# Patient Record
Sex: Male | Born: 1945
Health system: Southern US, Community
[De-identification: ages and names within clinical notes are randomized; demographics above are authoritative.]

## PROBLEM LIST (undated history)

## (undated) DIAGNOSIS — Z87442 Personal history of urinary calculi: Secondary | ICD-10-CM

## (undated) DIAGNOSIS — Z8719 Personal history of other diseases of the digestive system: Secondary | ICD-10-CM

## (undated) DIAGNOSIS — N183 Chronic kidney disease, stage 3 unspecified: Secondary | ICD-10-CM

## (undated) DIAGNOSIS — I2510 Atherosclerotic heart disease of native coronary artery without angina pectoris: Secondary | ICD-10-CM

## (undated) DIAGNOSIS — I739 Peripheral vascular disease, unspecified: Secondary | ICD-10-CM

## (undated) DIAGNOSIS — E785 Hyperlipidemia, unspecified: Secondary | ICD-10-CM

## (undated) DIAGNOSIS — I10 Essential (primary) hypertension: Secondary | ICD-10-CM

## (undated) DIAGNOSIS — D649 Anemia, unspecified: Secondary | ICD-10-CM

## (undated) DIAGNOSIS — M51379 Other intervertebral disc degeneration, lumbosacral region without mention of lumbar back pain or lower extremity pain: Secondary | ICD-10-CM

## (undated) DIAGNOSIS — I219 Acute myocardial infarction, unspecified: Secondary | ICD-10-CM

## (undated) DIAGNOSIS — Z9289 Personal history of other medical treatment: Secondary | ICD-10-CM

## (undated) DIAGNOSIS — K222 Esophageal obstruction: Secondary | ICD-10-CM

## (undated) DIAGNOSIS — Z5189 Encounter for other specified aftercare: Secondary | ICD-10-CM

## (undated) DIAGNOSIS — K635 Polyp of colon: Secondary | ICD-10-CM

## (undated) DIAGNOSIS — K219 Gastro-esophageal reflux disease without esophagitis: Secondary | ICD-10-CM

## (undated) DIAGNOSIS — M5137 Other intervertebral disc degeneration, lumbosacral region: Secondary | ICD-10-CM

## (undated) DIAGNOSIS — IMO0001 Reserved for inherently not codable concepts without codable children: Secondary | ICD-10-CM

## (undated) HISTORY — DX: Essential (primary) hypertension: I10

## (undated) HISTORY — DX: Gastro-esophageal reflux disease without esophagitis: K21.9

## (undated) HISTORY — DX: Peripheral vascular disease, unspecified: I73.9

## (undated) HISTORY — DX: Anemia, unspecified: D64.9

## (undated) HISTORY — PX: CARDIAC CATHETERIZATION: SHX172

## (undated) HISTORY — DX: Hyperlipidemia, unspecified: E78.5

## (undated) HISTORY — PX: ANGIOPLASTY: SHX39

## (undated) HISTORY — PX: BACK SURGERY: SHX140

## (undated) HISTORY — DX: Reserved for inherently not codable concepts without codable children: IMO0001

## (undated) HISTORY — PX: OTHER SURGICAL HISTORY: SHX169

## (undated) HISTORY — DX: Acute myocardial infarction, unspecified: I21.9

## (undated) HISTORY — PX: COLONOSCOPY: SHX174

## (undated) HISTORY — DX: Encounter for other specified aftercare: Z51.89

## (undated) HISTORY — DX: Personal history of other medical treatment: Z92.89

## (undated) HISTORY — DX: Personal history of urinary calculi: Z87.442

## (undated) HISTORY — DX: Polyp of colon: K63.5

## (undated) HISTORY — DX: Other intervertebral disc degeneration, lumbosacral region: M51.37

## (undated) HISTORY — DX: Esophageal obstruction: K22.2

## (undated) HISTORY — PX: LITHOTRIPSY: SUR834

## (undated) HISTORY — DX: Other intervertebral disc degeneration, lumbosacral region without mention of lumbar back pain or lower extremity pain: M51.379

## (undated) SURGERY — Surgical Case
Anesthesia: *Unknown

---

## 1991-01-02 HISTORY — PX: CORONARY ARTERY BYPASS GRAFT: SHX141

## 1999-03-01 ENCOUNTER — Inpatient Hospital Stay (HOSPITAL_COMMUNITY): Admission: AD | Admit: 1999-03-01 | Discharge: 1999-03-03 | Payer: Self-pay | Admitting: Cardiovascular Disease

## 2004-05-17 ENCOUNTER — Ambulatory Visit: Payer: Self-pay | Admitting: Cardiology

## 2005-04-16 ENCOUNTER — Ambulatory Visit: Payer: Self-pay | Admitting: *Deleted

## 2005-04-16 ENCOUNTER — Inpatient Hospital Stay (HOSPITAL_COMMUNITY): Admission: EM | Admit: 2005-04-16 | Discharge: 2005-04-20 | Payer: Self-pay | Admitting: Internal Medicine

## 2005-04-17 ENCOUNTER — Encounter: Payer: Self-pay | Admitting: Cardiology

## 2005-05-07 ENCOUNTER — Ambulatory Visit: Payer: Self-pay | Admitting: Cardiology

## 2005-05-17 ENCOUNTER — Ambulatory Visit: Payer: Self-pay | Admitting: Cardiology

## 2005-05-29 ENCOUNTER — Ambulatory Visit: Payer: Self-pay | Admitting: Internal Medicine

## 2005-05-31 ENCOUNTER — Ambulatory Visit: Payer: Self-pay | Admitting: Cardiology

## 2005-05-31 ENCOUNTER — Ambulatory Visit: Payer: Self-pay

## 2005-06-07 ENCOUNTER — Ambulatory Visit: Payer: Self-pay | Admitting: Internal Medicine

## 2005-07-18 ENCOUNTER — Ambulatory Visit: Payer: Self-pay | Admitting: Cardiology

## 2005-07-19 ENCOUNTER — Inpatient Hospital Stay (HOSPITAL_BASED_OUTPATIENT_CLINIC_OR_DEPARTMENT_OTHER): Admission: RE | Admit: 2005-07-19 | Discharge: 2005-07-19 | Payer: Self-pay | Admitting: Cardiology

## 2005-07-19 ENCOUNTER — Inpatient Hospital Stay (HOSPITAL_COMMUNITY): Admission: RE | Admit: 2005-07-19 | Discharge: 2005-07-20 | Payer: Self-pay | Admitting: Cardiology

## 2005-07-19 ENCOUNTER — Ambulatory Visit: Payer: Self-pay | Admitting: Cardiology

## 2005-08-01 ENCOUNTER — Ambulatory Visit: Payer: Self-pay | Admitting: *Deleted

## 2005-08-01 ENCOUNTER — Ambulatory Visit: Payer: Self-pay | Admitting: Cardiology

## 2006-07-18 ENCOUNTER — Ambulatory Visit: Payer: Self-pay | Admitting: Cardiology

## 2006-07-18 LAB — CONVERTED CEMR LAB
BUN: 12 mg/dL (ref 6–23)
Basophils Absolute: 0 10*3/uL (ref 0.0–0.1)
Creatinine, Ser: 1.2 mg/dL (ref 0.4–1.5)
GFR calc non Af Amer: 66 mL/min
Hemoglobin: 13.9 g/dL (ref 13.0–17.0)
MCHC: 34.9 g/dL (ref 30.0–36.0)
Monocytes Absolute: 0.7 10*3/uL (ref 0.2–0.7)
Monocytes Relative: 9.8 % (ref 3.0–11.0)
Potassium: 4.1 meq/L (ref 3.5–5.1)
Pro B Natriuretic peptide (BNP): 35 pg/mL (ref 0.0–100.0)
RBC: 4.29 M/uL (ref 4.22–5.81)
RDW: 12.4 % (ref 11.5–14.6)
TSH: 0.96 microintl units/mL (ref 0.35–5.50)

## 2006-07-25 ENCOUNTER — Ambulatory Visit: Payer: Self-pay

## 2006-08-13 ENCOUNTER — Ambulatory Visit: Payer: Self-pay | Admitting: Cardiology

## 2006-08-16 ENCOUNTER — Ambulatory Visit: Payer: Self-pay | Admitting: Cardiology

## 2006-08-21 ENCOUNTER — Ambulatory Visit: Payer: Self-pay | Admitting: Internal Medicine

## 2006-08-26 ENCOUNTER — Ambulatory Visit: Payer: Self-pay | Admitting: Internal Medicine

## 2006-08-26 ENCOUNTER — Encounter: Payer: Self-pay | Admitting: Internal Medicine

## 2006-08-26 DIAGNOSIS — K222 Esophageal obstruction: Secondary | ICD-10-CM | POA: Insufficient documentation

## 2007-03-11 ENCOUNTER — Ambulatory Visit: Payer: Self-pay | Admitting: Cardiology

## 2007-04-09 DIAGNOSIS — E785 Hyperlipidemia, unspecified: Secondary | ICD-10-CM | POA: Insufficient documentation

## 2007-04-09 DIAGNOSIS — M5137 Other intervertebral disc degeneration, lumbosacral region: Secondary | ICD-10-CM | POA: Insufficient documentation

## 2007-04-09 DIAGNOSIS — Z87442 Personal history of urinary calculi: Secondary | ICD-10-CM | POA: Insufficient documentation

## 2007-04-09 DIAGNOSIS — I10 Essential (primary) hypertension: Secondary | ICD-10-CM | POA: Insufficient documentation

## 2007-08-10 ENCOUNTER — Inpatient Hospital Stay (HOSPITAL_COMMUNITY): Admission: EM | Admit: 2007-08-10 | Discharge: 2007-08-12 | Payer: Self-pay | Admitting: Emergency Medicine

## 2007-08-10 ENCOUNTER — Ambulatory Visit: Payer: Self-pay | Admitting: Cardiology

## 2007-08-27 ENCOUNTER — Ambulatory Visit: Payer: Self-pay | Admitting: Cardiology

## 2007-10-01 ENCOUNTER — Ambulatory Visit: Payer: Self-pay | Admitting: Cardiology

## 2007-10-01 LAB — CONVERTED CEMR LAB
Basophils Absolute: 0 10*3/uL (ref 0.0–0.1)
Chloride: 101 meq/L (ref 96–112)
Eosinophils Absolute: 0.2 10*3/uL (ref 0.0–0.7)
Eosinophils Relative: 3.7 % (ref 0.0–5.0)
GFR calc Af Amer: 53 mL/min
GFR calc non Af Amer: 44 mL/min
MCHC: 34.2 g/dL (ref 30.0–36.0)
MCV: 94.2 fL (ref 78.0–100.0)
Neutrophils Relative %: 56.8 % (ref 43.0–77.0)
Platelets: 224 10*3/uL (ref 150–400)
Potassium: 4.2 meq/L (ref 3.5–5.1)
RDW: 12.7 % (ref 11.5–14.6)
Sodium: 139 meq/L (ref 135–145)
WBC: 6.3 10*3/uL (ref 4.5–10.5)
aPTT: 28.5 s (ref 21.7–29.8)

## 2007-10-08 ENCOUNTER — Ambulatory Visit: Payer: Self-pay | Admitting: Cardiology

## 2007-10-08 ENCOUNTER — Inpatient Hospital Stay (HOSPITAL_COMMUNITY): Admission: AD | Admit: 2007-10-08 | Discharge: 2007-10-09 | Payer: Self-pay | Admitting: Cardiology

## 2007-10-09 ENCOUNTER — Encounter: Payer: Self-pay | Admitting: Cardiology

## 2007-10-22 ENCOUNTER — Ambulatory Visit: Payer: Self-pay | Admitting: Cardiology

## 2007-11-07 ENCOUNTER — Ambulatory Visit: Payer: Self-pay

## 2007-11-07 ENCOUNTER — Ambulatory Visit: Payer: Self-pay | Admitting: Cardiovascular Disease

## 2007-12-05 ENCOUNTER — Ambulatory Visit: Payer: Self-pay | Admitting: Cardiovascular Disease

## 2008-02-27 ENCOUNTER — Ambulatory Visit: Payer: Self-pay | Admitting: Cardiovascular Disease

## 2008-03-05 ENCOUNTER — Ambulatory Visit: Payer: Self-pay | Admitting: Cardiology

## 2008-03-05 LAB — CONVERTED CEMR LAB
BUN: 12 mg/dL (ref 6–23)
Chloride: 109 meq/L (ref 96–112)
Eosinophils Absolute: 0.2 10*3/uL (ref 0.0–0.7)
Eosinophils Relative: 3.2 % (ref 0.0–5.0)
GFR calc non Af Amer: 55 mL/min
Glucose, Bld: 112 mg/dL — ABNORMAL HIGH (ref 70–99)
INR: 1 (ref 0.8–1.0)
Monocytes Relative: 11.9 % (ref 3.0–12.0)
Neutrophils Relative %: 59.3 % (ref 43.0–77.0)
Platelets: 224 10*3/uL (ref 150–400)
Potassium: 4.5 meq/L (ref 3.5–5.1)
RDW: 12.9 % (ref 11.5–14.6)
Sodium: 143 meq/L (ref 135–145)
WBC: 5.7 10*3/uL (ref 4.5–10.5)

## 2008-03-09 ENCOUNTER — Inpatient Hospital Stay (HOSPITAL_COMMUNITY): Admission: AD | Admit: 2008-03-09 | Discharge: 2008-03-10 | Payer: Self-pay | Admitting: Cardiology

## 2008-03-09 ENCOUNTER — Ambulatory Visit: Payer: Self-pay | Admitting: Cardiology

## 2008-03-10 ENCOUNTER — Encounter: Payer: Self-pay | Admitting: Cardiology

## 2008-03-24 ENCOUNTER — Encounter (INDEPENDENT_AMBULATORY_CARE_PROVIDER_SITE_OTHER): Payer: Self-pay

## 2008-03-25 DIAGNOSIS — I25810 Atherosclerosis of coronary artery bypass graft(s) without angina pectoris: Secondary | ICD-10-CM | POA: Insufficient documentation

## 2008-03-26 ENCOUNTER — Encounter: Payer: Self-pay | Admitting: Cardiology

## 2008-03-26 ENCOUNTER — Ambulatory Visit: Payer: Self-pay | Admitting: Cardiology

## 2008-03-26 LAB — CONVERTED CEMR LAB
BUN: 15 mg/dL (ref 6–23)
Basophils Relative: 0.7 % (ref 0.0–3.0)
Chloride: 108 meq/L (ref 96–112)
Eosinophils Relative: 3.5 % (ref 0.0–5.0)
GFR calc non Af Amer: 59.54 mL/min (ref >60)
HCT: 34.1 % — ABNORMAL LOW (ref 39.0–52.0)
Hemoglobin: 11.4 g/dL — ABNORMAL LOW (ref 13.0–17.0)
Lymphs Abs: 1.7 10*3/uL (ref 0.7–4.0)
MCV: 84.8 fL (ref 78.0–100.0)
Monocytes Absolute: 0.7 10*3/uL (ref 0.1–1.0)
Monocytes Relative: 11.9 % (ref 3.0–12.0)
Neutro Abs: 3.5 10*3/uL (ref 1.4–7.7)
Platelets: 214 10*3/uL (ref 150.0–400.0)
Potassium: 4.4 meq/L (ref 3.5–5.1)
Sodium: 140 meq/L (ref 135–145)
WBC: 6.1 10*3/uL (ref 4.5–10.5)

## 2008-06-25 ENCOUNTER — Ambulatory Visit: Payer: Self-pay | Admitting: Cardiovascular Disease

## 2008-06-26 ENCOUNTER — Encounter: Payer: Self-pay | Admitting: Cardiology

## 2008-07-23 ENCOUNTER — Encounter: Payer: Self-pay | Admitting: Cardiovascular Disease

## 2008-07-23 LAB — CONVERTED CEMR LAB
Fecal Occult Blood: NEGATIVE
OCCULT 1: NEGATIVE
OCCULT 4: NEGATIVE
OCCULT 5: NEGATIVE

## 2008-07-27 ENCOUNTER — Encounter: Payer: Self-pay | Admitting: Cardiology

## 2008-07-29 ENCOUNTER — Telehealth: Payer: Self-pay | Admitting: Cardiology

## 2008-08-03 ENCOUNTER — Ambulatory Visit: Payer: Self-pay | Admitting: Cardiology

## 2008-08-03 DIAGNOSIS — K921 Melena: Secondary | ICD-10-CM | POA: Insufficient documentation

## 2008-08-03 LAB — CONVERTED CEMR LAB
Fecal Occult Blood: POSITIVE
OCCULT 1: POSITIVE
OCCULT 2: POSITIVE
OCCULT 3: POSITIVE

## 2008-08-05 ENCOUNTER — Ambulatory Visit: Payer: Self-pay | Admitting: Internal Medicine

## 2008-08-09 LAB — CONVERTED CEMR LAB
Folate: 7.8 ng/mL
Saturation Ratios: 4.6 % — ABNORMAL LOW (ref 20.0–50.0)
Vitamin B-12: 162 pg/mL — ABNORMAL LOW (ref 211–911)

## 2008-08-11 ENCOUNTER — Telehealth: Payer: Self-pay | Admitting: Internal Medicine

## 2008-08-11 ENCOUNTER — Ambulatory Visit: Payer: Self-pay | Admitting: Internal Medicine

## 2008-08-11 DIAGNOSIS — E538 Deficiency of other specified B group vitamins: Secondary | ICD-10-CM | POA: Insufficient documentation

## 2008-08-12 ENCOUNTER — Telehealth: Payer: Self-pay | Admitting: Internal Medicine

## 2008-08-16 ENCOUNTER — Telehealth: Payer: Self-pay | Admitting: Internal Medicine

## 2008-08-19 ENCOUNTER — Encounter: Payer: Self-pay | Admitting: Cardiology

## 2008-08-19 ENCOUNTER — Ambulatory Visit: Payer: Self-pay | Admitting: Internal Medicine

## 2008-08-19 ENCOUNTER — Encounter: Payer: Self-pay | Admitting: Internal Medicine

## 2008-08-23 ENCOUNTER — Encounter: Payer: Self-pay | Admitting: Internal Medicine

## 2008-10-14 ENCOUNTER — Ambulatory Visit: Payer: Self-pay | Admitting: Cardiology

## 2008-11-01 ENCOUNTER — Encounter: Payer: Self-pay | Admitting: Cardiology

## 2008-11-01 ENCOUNTER — Ambulatory Visit: Payer: Self-pay | Admitting: Cardiovascular Disease

## 2008-11-04 ENCOUNTER — Telehealth: Payer: Self-pay | Admitting: Cardiology

## 2008-11-11 ENCOUNTER — Encounter (INDEPENDENT_AMBULATORY_CARE_PROVIDER_SITE_OTHER): Payer: Self-pay | Admitting: *Deleted

## 2008-11-12 ENCOUNTER — Ambulatory Visit: Payer: Self-pay | Admitting: Cardiology

## 2008-11-12 ENCOUNTER — Encounter (INDEPENDENT_AMBULATORY_CARE_PROVIDER_SITE_OTHER): Payer: Self-pay | Admitting: *Deleted

## 2008-11-12 LAB — CONVERTED CEMR LAB
Iron: 14 ug/dL — ABNORMAL LOW (ref 42–165)
Transferrin: 280.7 mg/dL (ref 212.0–360.0)

## 2008-11-15 ENCOUNTER — Encounter: Payer: Self-pay | Admitting: Internal Medicine

## 2008-11-15 ENCOUNTER — Telehealth: Payer: Self-pay | Admitting: Internal Medicine

## 2008-11-15 DIAGNOSIS — D509 Iron deficiency anemia, unspecified: Secondary | ICD-10-CM | POA: Insufficient documentation

## 2008-11-19 ENCOUNTER — Encounter: Payer: Self-pay | Admitting: Internal Medicine

## 2008-11-19 ENCOUNTER — Ambulatory Visit (HOSPITAL_COMMUNITY): Admission: RE | Admit: 2008-11-19 | Discharge: 2008-11-19 | Payer: Self-pay | Admitting: Internal Medicine

## 2008-12-07 ENCOUNTER — Ambulatory Visit: Payer: Self-pay | Admitting: Internal Medicine

## 2008-12-07 ENCOUNTER — Telehealth: Payer: Self-pay | Admitting: Internal Medicine

## 2008-12-08 LAB — CONVERTED CEMR LAB
Basophils Absolute: 0 10*3/uL (ref 0.0–0.1)
Basophils Relative: 0.3 % (ref 0.0–3.0)
Eosinophils Absolute: 0.2 10*3/uL (ref 0.0–0.7)
Iron: 33 ug/dL — ABNORMAL LOW (ref 42–165)
Lymphocytes Relative: 21.8 % (ref 12.0–46.0)
MCHC: 32.5 g/dL (ref 30.0–36.0)
MCV: 84.4 fL (ref 78.0–100.0)
Monocytes Absolute: 0.7 10*3/uL (ref 0.1–1.0)
Neutro Abs: 3.9 10*3/uL (ref 1.4–7.7)
Neutrophils Relative %: 63.8 % (ref 43.0–77.0)
RBC: 4 M/uL — ABNORMAL LOW (ref 4.22–5.81)
RDW: 24.7 % — ABNORMAL HIGH (ref 11.5–14.6)

## 2008-12-10 ENCOUNTER — Ambulatory Visit (HOSPITAL_COMMUNITY): Admission: RE | Admit: 2008-12-10 | Discharge: 2008-12-10 | Payer: Self-pay | Admitting: Internal Medicine

## 2008-12-10 ENCOUNTER — Ambulatory Visit: Payer: Self-pay | Admitting: Internal Medicine

## 2009-05-03 ENCOUNTER — Encounter: Payer: Self-pay | Admitting: Cardiology

## 2009-05-03 ENCOUNTER — Ambulatory Visit: Payer: Self-pay | Admitting: Cardiovascular Disease

## 2009-05-11 ENCOUNTER — Telehealth (INDEPENDENT_AMBULATORY_CARE_PROVIDER_SITE_OTHER): Payer: Self-pay | Admitting: *Deleted

## 2009-08-16 ENCOUNTER — Encounter: Payer: Self-pay | Admitting: Cardiovascular Disease

## 2009-08-17 ENCOUNTER — Ambulatory Visit: Payer: Self-pay | Admitting: Cardiovascular Disease

## 2009-08-17 ENCOUNTER — Ambulatory Visit: Payer: Self-pay

## 2009-08-31 ENCOUNTER — Encounter: Payer: Self-pay | Admitting: Cardiology

## 2009-08-31 ENCOUNTER — Ambulatory Visit: Payer: Self-pay | Admitting: Cardiovascular Disease

## 2009-12-20 ENCOUNTER — Encounter: Payer: Self-pay | Admitting: Cardiology

## 2010-01-07 ENCOUNTER — Inpatient Hospital Stay (HOSPITAL_COMMUNITY)
Admission: EM | Admit: 2010-01-07 | Discharge: 2010-01-10 | Payer: Self-pay | Source: Home / Self Care | Attending: Cardiovascular Disease | Admitting: Cardiovascular Disease

## 2010-01-09 ENCOUNTER — Encounter (INDEPENDENT_AMBULATORY_CARE_PROVIDER_SITE_OTHER): Payer: Self-pay | Admitting: Internal Medicine

## 2010-01-16 LAB — LIPID PANEL
Cholesterol: 143 mg/dL (ref 0–200)
LDL Cholesterol: 64 mg/dL (ref 0–99)
Total CHOL/HDL Ratio: 6 RATIO
Triglycerides: 276 mg/dL — ABNORMAL HIGH (ref <150)
VLDL: 55 mg/dL — ABNORMAL HIGH (ref 0–40)

## 2010-01-16 LAB — CBC
HCT: 39.2 % (ref 39.0–52.0)
HCT: 36.3 % — ABNORMAL LOW (ref 39.0–52.0)
HCT: 36.6 % — ABNORMAL LOW (ref 39.0–52.0)
Hemoglobin: 12.7 g/dL — ABNORMAL LOW (ref 13.0–17.0)
Hemoglobin: 12.1 g/dL — ABNORMAL LOW (ref 13.0–17.0)
Hemoglobin: 12.2 g/dL — ABNORMAL LOW (ref 13.0–17.0)
MCH: 28.3 pg (ref 26.0–34.0)
MCH: 29.3 pg (ref 26.0–34.0)
MCH: 29.4 pg (ref 26.0–34.0)
MCHC: 32.4 g/dL (ref 30.0–36.0)
MCHC: 33.3 g/dL (ref 30.0–36.0)
MCHC: 33.3 g/dL (ref 30.0–36.0)
MCV: 87.3 fL (ref 78.0–100.0)
MCV: 88 fL (ref 78.0–100.0)
MCV: 88.3 fL (ref 78.0–100.0)
Platelets: 206 10*3/uL (ref 150–400)
Platelets: 187 10*3/uL (ref 150–400)
Platelets: 190 10*3/uL (ref 150–400)
RBC: 4.49 MIL/uL (ref 4.22–5.81)
RBC: 4.11 MIL/uL — ABNORMAL LOW (ref 4.22–5.81)
RBC: 4.16 MIL/uL — ABNORMAL LOW (ref 4.22–5.81)
RDW: 13.6 % (ref 11.5–15.5)
RDW: 13.7 % (ref 11.5–15.5)
RDW: 13.7 % (ref 11.5–15.5)
WBC: 5.8 10*3/uL (ref 4.0–10.5)
WBC: 5.8 10*3/uL (ref 4.0–10.5)
WBC: 8.6 10*3/uL (ref 4.0–10.5)

## 2010-01-16 LAB — CARDIAC PANEL(CRET KIN+CKTOT+MB+TROPI)
CK, MB: 12.6 ng/mL (ref 0.3–4.0)
CK, MB: 14.8 ng/mL (ref 0.3–4.0)
CK, MB: 10.6 ng/mL (ref 0.3–4.0)
Relative Index: 10.6 — ABNORMAL HIGH (ref 0.0–2.5)
Relative Index: 12 — ABNORMAL HIGH (ref 0.0–2.5)
Relative Index: 10.1 — ABNORMAL HIGH (ref 0.0–2.5)
Total CK: 119 U/L (ref 7–232)
Total CK: 123 U/L (ref 7–232)
Total CK: 105 U/L (ref 7–232)
Troponin I: 1.03 ng/mL (ref 0.00–0.06)
Troponin I: 1.68 ng/mL (ref 0.00–0.06)
Troponin I: 1.27 ng/mL (ref 0.00–0.06)

## 2010-01-16 LAB — HEPARIN LEVEL (UNFRACTIONATED)
Heparin Unfractionated: 0.65 IU/mL (ref 0.30–0.70)
Heparin Unfractionated: 0.48 IU/mL (ref 0.30–0.70)
Heparin Unfractionated: <0.1 IU/mL — ABNORMAL LOW (ref 0.30–0.70)

## 2010-01-16 LAB — POCT I-STAT, CHEM 8
BUN: 11 mg/dL (ref 6–23)
Calcium, Ion: 1.11 mmol/L — ABNORMAL LOW (ref 1.12–1.32)
Chloride: 106 mEq/L (ref 96–112)
Creatinine, Ser: 1.4 mg/dL (ref 0.4–1.5)
Glucose, Bld: 195 mg/dL — ABNORMAL HIGH (ref 70–99)
HCT: 39 % (ref 39.0–52.0)
Hemoglobin: 13.3 g/dL (ref 13.0–17.0)
Potassium: 3.6 mEq/L (ref 3.5–5.1)
Sodium: 141 mEq/L (ref 135–145)
TCO2: 24 mmol/L (ref 0–100)

## 2010-01-16 LAB — MAGNESIUM: Magnesium: 2.2 mg/dL (ref 1.5–2.5)

## 2010-01-16 LAB — BASIC METABOLIC PANEL
BUN: 13 mg/dL (ref 6–23)
BUN: 12 mg/dL (ref 6–23)
CO2: 26 mEq/L (ref 19–32)
CO2: 25 mEq/L (ref 19–32)
Calcium: 8.8 mg/dL (ref 8.4–10.5)
Calcium: 8.7 mg/dL (ref 8.4–10.5)
Chloride: 109 mEq/L (ref 96–112)
Chloride: 110 mEq/L (ref 96–112)
Creatinine, Ser: 1.45 mg/dL (ref 0.4–1.5)
Creatinine, Ser: 1.48 mg/dL (ref 0.4–1.5)
GFR calc Af Amer: 59 mL/min — ABNORMAL LOW (ref >60)
GFR calc Af Amer: 58 mL/min — ABNORMAL LOW (ref >60)
GFR calc non Af Amer: 49 mL/min — ABNORMAL LOW (ref >60)
GFR calc non Af Amer: 48 mL/min — ABNORMAL LOW (ref >60)
Glucose, Bld: 145 mg/dL — ABNORMAL HIGH (ref 70–99)
Glucose, Bld: 134 mg/dL — ABNORMAL HIGH (ref 70–99)
Potassium: 4.2 mEq/L (ref 3.5–5.1)
Potassium: 4 mEq/L (ref 3.5–5.1)
Sodium: 140 mEq/L (ref 135–145)
Sodium: 140 mEq/L (ref 135–145)

## 2010-01-16 LAB — TROPONIN I: Troponin I: 0.09 ng/mL — ABNORMAL HIGH (ref 0.00–0.06)

## 2010-01-16 LAB — PROTIME-INR
INR: 1.04 (ref 0.00–1.49)
Prothrombin Time: 13.8 seconds (ref 11.6–15.2)

## 2010-01-16 LAB — DIFFERENTIAL
Basophils Absolute: 0 10*3/uL (ref 0.0–0.1)
Basophils Relative: 0 % (ref 0–1)
Eosinophils Absolute: 0.1 10*3/uL (ref 0.0–0.7)
Eosinophils Relative: 2 % (ref 0–5)
Lymphocytes Relative: 18 % (ref 12–46)
Lymphs Abs: 1.1 10*3/uL (ref 0.7–4.0)
Monocytes Absolute: 0.7 10*3/uL (ref 0.1–1.0)
Monocytes Relative: 12 % (ref 3–12)
Neutro Abs: 3.9 10*3/uL (ref 1.7–7.7)
Neutrophils Relative %: 68 % (ref 43–77)

## 2010-01-16 LAB — CK TOTAL AND CKMB (NOT AT ARMC)
CK, MB: 3.2 ng/mL (ref 0.3–4.0)
Relative Index: INVALID (ref 0.0–2.5)
Total CK: 85 U/L (ref 7–232)

## 2010-01-16 LAB — POCT CARDIAC MARKERS
CKMB, poc: 1.1 ng/mL (ref 1.0–8.0)
CKMB, poc: 2.2 ng/mL (ref 1.0–8.0)
Myoglobin, poc: 84.5 ng/mL (ref 12–200)
Myoglobin, poc: 185 ng/mL (ref 12–200)
Troponin i, poc: <0.05 ng/mL (ref 0.00–0.09)
Troponin i, poc: <0.05 ng/mL (ref 0.00–0.09)

## 2010-01-16 LAB — PLATELET INHIBITION P2Y12
Platelet Function  P2Y12: 124 [PRU] — ABNORMAL LOW (ref 194–418)
Platelet Function Baseline: 293 [PRU] (ref 194–418)

## 2010-01-16 LAB — MRSA PCR SCREENING: MRSA by PCR: NEGATIVE

## 2010-01-16 LAB — HEMOGLOBIN A1C: Hgb A1c MFr Bld: 6.4 % — ABNORMAL HIGH (ref <5.7)

## 2010-01-20 ENCOUNTER — Encounter: Payer: Self-pay | Admitting: Physician Assistant

## 2010-01-22 NOTE — Procedures (Addendum)
  NAMESIVAN, QUAST NO.:  [id number]  MEDICAL RECORD NO.:  [id number]          PATIENT TYPE:  INP  LOCATION:  2915                         FACILITY:  [facility]  PHYSICIAN:  Bevelyn Buckles. Bensimhon, MDDATE OF BIRTH:  1945/01/27  DATE OF PROCEDURE:  01/09/2010 DATE OF DISCHARGE:                           CARDIAC CATHETERIZATION   PATIENT IDENTIFICATION:  [name] is a very pleasant 65 year old male with history of severe coronary artery disease status post multiple percutaneous interventions.  Most recently he underwent overlapping long drug-eluting stents to the right coronary artery in March 2010.  In 2009,  Dr. Excell Seltzer placed dual Promus drug-eluting stents in the saphenous vein graft to his marginal system.  He was admitted with a non- ST elevation myocardial infarction and brought for diagnostic angiography.  PROCEDURES PERFORMED: 1. Selective coronary angiography. 2. Left internal mammary artery angiography. 3. Saphenous vein graft angiography x2. 4. Left heart catheterization. 5. Left ventriculogram.  DESCRIPTION OF PROCEDURE:  The risks and indication were explained. Consent was signed and placed on the chart.  A 5-French arterial sheath was placed in the right femoral artery using modified Seldinger technique.  Standard catheters including JL-4, JR-4, and angled pigtail were used.  All catheter exchanges made over wire.  There were no apparent complications.  Central aortic pressure 107/57 with a mean of 77.  LV pressure of 122/5 with an EDP of 14.  There was no aortic stenosis.  Left main was free of significant disease.  LAD gave off a tiny OM1 and an OM2 and then   DICTATION ENDED AT THIS POINT.     Bevelyn Buckles. Bensimhon, MD     DRB/MEDQ  D:  01/09/2010  T:  01/10/2010  Job:  [id number]  Electronically Signed by Arvilla Meres MD on 01/22/2010 07:12:53 PM

## 2010-01-22 NOTE — Procedures (Addendum)
NAMEJAVID, [name] NO.:  [id number]  MEDICAL RECORD NO.:  [id number]          PATIENT TYPE:  INP  LOCATION:  2915                         FACILITY:  [facility]  PHYSICIAN:  [name], MDDATE OF BIRTH:  July 24, 1945  DATE OF PROCEDURE:  01/09/2010 DATE OF DISCHARGE:                           CARDIAC CATHETERIZATION   PRIMARY CARE PHYSICIAN:  Dr. [name] in [place], [place].  PATIENT IDENTIFICATION:  [name] is a very pleasant 65 year old male with history of known severe coronary artery disease.  He is status post bypass surgery and multiple coronary interventions.  He underwent dual drug-eluting stents to the right coronary artery back in March 2010 with [name] and in 2009 [name] placed Promus drug-eluting stents in his saphenous vein graft to the marginal system.  He is admitted with non-ST elevation myocardial infarction and brought to the catheterization lab for diagnostic angiography.  PROCEDURES PERFORMED: 1. Selective coronary angiography. 2. Left internal mammary artery angiography. 3. Saphenous vein graft angiography x2. 4. Left heart catheterization. 5. Left ventriculogram.  DESCRIPTION OF PROCEDURE:  The risks and indication of catheterization were explained.  Consent was signed and placed on the chart.  A 5-French arterial sheath was placed in the right femoral artery using modified Seldinger technique.  Standard catheters including JL-4, JR-4, and angled pigtail were used.  All catheter exchanges made over a wire. There were no apparent complications.  Central aortic pressure 107/57 with mean of 77.  LV pressure of 122/5 with an EDP of 14.  There was no aortic stenosis.  Left main was free of significant disease.  LAD gave off a tiny first diagonal and small to moderate second diagonal.  It was then totally occluded.  Left circumflex gave off a tiny OM branch and that was totally occluded in the proximal  portion.  Right coronary artery was a large dominant vessel.  It gave off PDA in the posterolateral.  There was a long area of stenting from the proximal RCA all the way around the bend to the distal RCA.  Prior to the stenting there was a 30% area of stenosis.  The stents were widely patent.  There was a distal 30% stenosis just prior to the PDA.  Saphenous vein graft to the RCA was totally occluded.  This was chronic.  Saphenous vein sequential graft to the OM1 and OM2 had two previously placed drug-eluting stents.  In the ostial stent there was a long 90-99% stenosis.  The remainder of the graft was free of significant disease.  LIMA to the LAD was widely patent.  There appeared to be a sequential graft to the second diagonal, which was also patent.  The LAD itself had just mild plaquing.  There was collateral from the apical LAD to what appeared to be an atrial branch.  Left ventriculogram done in the RAO position by hand injection showed an ejection fraction of approximately 60%.  Otherwise, no apparent wall motion abnormalities, though the opacification of the ventricle was limited.  ASSESSMENT: 1. Severe three-vessel coronary artery disease. 2. Saphenous vein graft to the right coronary artery system that  is     totally occluded, which is chronic. 3. Left internal mammary artery to the left anterior descending and     second diagonal are widely patent. 4. Saphenous vein graft to the obtuse marginal 2 and obtuse marginal 3     have a long 90-99% in-stent restenosis in the proximal stent. 5. Normal left ventricular function.  PLAN/DISCUSSION:  He will need percutaneous intervention on the saphenous vein graft to the obtuse marginal system by [name].     Douglas Buckles. Bensimhon, MD     DRB/MEDQ  D:  01/09/2010  T:  01/10/2010  Job:  [id number]  Electronically Signed by Douglas Meres MD on 01/22/2010 07:12:55 PM

## 2010-01-23 ENCOUNTER — Encounter: Payer: Self-pay | Admitting: Physician Assistant

## 2010-01-23 ENCOUNTER — Ambulatory Visit
Admission: RE | Admit: 2010-01-23 | Discharge: 2010-01-23 | Payer: Self-pay | Source: Home / Self Care | Attending: Physician Assistant | Admitting: Physician Assistant

## 2010-01-23 ENCOUNTER — Other Ambulatory Visit: Payer: Self-pay | Admitting: Physician Assistant

## 2010-01-23 LAB — BASIC METABOLIC PANEL
CO2: 23 mEq/L (ref 19–32)
Calcium: 9 mg/dL (ref 8.4–10.5)
Creatinine, Ser: 1.6 mg/dL — ABNORMAL HIGH (ref 0.4–1.5)
Glucose, Bld: 103 mg/dL — ABNORMAL HIGH (ref 70–99)

## 2010-01-24 ENCOUNTER — Encounter: Payer: Self-pay | Admitting: Physician Assistant

## 2010-01-31 NOTE — Discharge Summary (Signed)
[name], [name]                ACCOUNT NO.:  [id number]  MEDICAL RECORD NO.:  [id number]           PATIENT TYPE:  LOCATION:                                 FACILITY:  PHYSICIAN:  [name], [name]  DATE OF BIRTH:  Oct 03, 1945  DATE OF ADMISSION:  01/08/2010 DATE OF DISCHARGE:  01/10/2010                              DISCHARGE SUMMARY   PRIMARY CARDIOLOGIST:  [name], [name]  PRIMARY CARE PROVIDER:  Dr. [name]  DISCHARGE DIAGNOSIS:  Non-ST-segment elevation myocardial infarction.  SECONDARY DIAGNOSES: 1. Coronary artery disease status post prior coronary bypass grafting     as well as prior stenting with repeat stenting due to in-stent     restenosis in the vein graft to the OM2 and OM3 this admission. 2. Hypertension. 3. Hyperlipidemia. 4. Degenerative disk disease involving the neck and back. 5. History of nephrolithiasis status post lithotripsy. 6. Remote tobacco abuse quitting in 1989.  ALLERGIES:  No known drug allergies.  PROCEDURES:  A 2-D echocardiogram January 09, 2010 revealing an EF of 60- 65% with mild LVH and no regional wall motion abnormalities.  There is grade 1 diastolic dysfunction.  PROCEDURE:  Left heart cardiac catheterization January 09, 2010 revealing an EF of 60%.  The patient had significant multivessel coronary artery disease involving the LAD and branch vessels as well as the left circumflex and branch vessels.  There were multiple patent stents throughout the right coronary artery.  Vein graft to the right coronary artery was occluded.  LIMA to the LAD and diagonal was patent. Sequential vein graft to the OM2 and OM3 had patent stents in the distal portion of the graft body with a 99% in-stent restenosis within the proximal portion of the graft.  PROCEDURES:  Successful PCI and stenting of the vein graft to the OM2 and OM3 with placement of 3.5 x 32-mm ION drug-eluting stent along with a 3.0 x 12-mm  ION drug-eluting stent.  HISTORY OF PRESENT ILLNESS:  A 66 year old male with prior history of coronary disease status post prior coronary bypass grafting as well as prior stenting of the right coronary artery and sequential vein graft to the OM2 and OM3.  The patient was in his usual state of health until the evening prior to admission when he had sudden onset of substernal chest tightness with radiation to his arm and associated restlessness.  He presented to the [facility] ED where his initial set initial point-of- care markers were negative and ECG showed no acute changes.  He was admitted for further evaluation.  HOSPITAL COURSE:  The patient subsequently ruled in for non-ST-segment elevation myocardial infarction eventually peaking his CK at 123, MB at 14.8, and troponin I at 1.68.  The patient was maintained on aspirin, Plavix, beta-blocker, and statin therapy and was also anticoagulated using heparin.  Decision was made to pursue cardiac catheterization which took place January 9 revealing significant multivessel native coronary artery disease with patency of the LIMA to the LAD and diagonal and new in-stent restenosis within the sequential vein graft to the OM2 and OM3.  This  was successfully stented with 2 ION drug-eluting stents as outlined above.  The patient tolerated the procedure well postprocedure and had been ambulating without recurrent symptoms or limitations.  He will be discharged home today in good condition.  DISCHARGE LABS:  Hemoglobin 12.1, hematocrit 36.3, WBC 5.8, platelets 187,000.  P2Y12 124 with 58% inhibition.  Sodium 140, potassium 4.0, chloride 110, CO2 25, BUN 12, creatinine 1.48, glucose 134, calcium 8.7, hemoglobin A1c 6.4, CK 105, MB 10.6, troponin-I 1.27, total cholesterol 143, triglycerides 276, HDL 24, LDL 64.  MRSA screen was negative.  DISPOSITION:  The patient will be discharged home today in good condition.  FOLLOWUP PLANS AND  APPOINTMENTS:  We have arranged followup with Douglas Newcomer, PA in [facility] Office on January 23 at 12:00 p.m.  He will follow up with Dr. [name] from there forward, follow up with [name] as previously scheduled.  DISCHARGE MEDICATIONS:  Amlodipine 10 mg daily, nitroglycerin 0.4 mg sublingual p.r.n. chest pain, aspirin 325 mg daily, lisinopril 20 mg b.i.d., metoprolol tartrate 50 mg b.i.d., Plavix 75 mg daily, simvastatin 40 mg at bedtime.  OUTSTANDING LABS AND STUDIES:  None.  DURATION OF DISCHARGE ENCOUNTER:  45 minutes including physician time.     [name], Rice   ______________________________ [name], [name]    CB/MEDQ  D:  01/10/2010  T:  01/11/2010  Job:  [id number]  cc:   [name]  Electronically Signed by [name] on 01/23/2010 03:56:30 PM Electronically Signed by [name] on 01/31/2010 04:47:36 AM

## 2010-02-02 NOTE — Miscellaneous (Signed)
  Clinical Lists Changes  Observations: Added new observation of CARDCATHFIND: FINAL INTERPRETATION:  Successful stenting of the proximal saphenous vein graft to obtuse marginal vessel with drug-eluting stent for restenosis.   (01/09/2010 9:33) Added new observation of CARDCATHFIND: DESCRIPTION OF PROCEDURE:  The risks and indication were explained. Consent was signed and placed on the chart.  A 5-French arterial sheath was placed in the right femoral artery using modified Seldinger technique.  Standard catheters including JL-4, JR-4, and angled pigtail were used.  All catheter exchanges made over wire.  There were no apparent complications.   Central aortic pressure 107/57 with a mean of 77.  LV pressure of 122/5 with an EDP of 14.  There was no aortic stenosis.   Left main was free of significant disease.   LAD gave off a tiny OM1 and an OM2 and then     DICTATION ENDED AT THIS POINT.     (01/09/2010 9:32)      Cardiac Cath  Procedure date:  01/09/2010  Findings:      DESCRIPTION OF PROCEDURE:  The risks and indication were explained. Consent was signed and placed on the chart.  A 5-French arterial sheath was placed in the right femoral artery using modified Seldinger technique.  Standard catheters including JL-4, JR-4, and angled pigtail were used.  All catheter exchanges made over wire.  There were no apparent complications.   Central aortic pressure 107/57 with a mean of 77.  LV pressure of 122/5 with an EDP of 14.  There was no aortic stenosis.   Left main was free of significant disease.   LAD gave off a tiny OM1 and an OM2 and then     DICTATION ENDED AT THIS POINT.      Cardiac Cath  Procedure date:  01/09/2010  Findings:      FINAL INTERPRETATION:  Successful stenting of the proximal saphenous vein graft to obtuse marginal vessel with drug-eluting stent for restenosis.

## 2010-02-02 NOTE — Assessment & Plan Note (Signed)
Summary: eph  Medications Added LISINOPRIL 10 MG TABS (LISINOPRIL) 1 tab by mouth two times a day CRESTOR 20 MG TABS (ROSUVASTATIN CALCIUM) Take one tablet by mouth daily. ASPIRIN EC 325 MG TBEC (ASPIRIN) Take one tablet by mouth daily AMLODIPINE BESYLATE 10 MG TABS (AMLODIPINE BESYLATE) Take one tablet by mouth daily        Referring Provider:  n/a Primary Provider:  Jefm Petty MD   History of Present Illness: Primary Cardiologist:  Dr. Tonny Bollman  [name] is a 65 yo male previously followed by Dr. Juanda Chance, with a h/o CAD, s/p CABG in 1993 and multiple PCIs in the past including a DES to the RCA in 2007 2/2 NSTEMI, DES to the S-OM in 2009 and DES to the mid and dist RCA in 2010.  He presented to [facility] with a NSTEMI on 01/08/10 and was noted to have a high grade lesion to in the S-OM1/OM2.  This was treated with 2 ION drug eluting stents.  He presents for follow up.  Labs:  Na 140, K 4, BUN 12, Creat 1.48 (01/10/10) Hgb 12.1 (01/10/10) TC 143, TG 276, HDL 24, LDL 64 (01/10/10) A1C 6.4 (1/61/09)  He is doing well.  He denies chest pain, shortness of breath, syncope.  He denies orthopnea or PND.  His right femoral artery site seems to have healed well without pain or swelling.  Current Medications (verified): 1)  Plavix 75 Mg Tabs (Clopidogrel Bisulfate) .... Take Ine Tab Once Daily 2)  Lisinopril 10 Mg Tabs (Lisinopril) .Marland Kitchen.. 1 Tab By Mouth Two Times A Day 3)  Metoprolol Tartrate 50 Mg Tabs (Metoprolol Tartrate) .... Take One Tablet By Mouth Twice A Day 4)  Nitrostat 0.4 Mg Subl (Nitroglycerin) .... Prn 5)  Simvastatin 40 Mg Tabs (Simvastatin) .Marland Kitchen.. 1 Tablet Before Bedtime 6)  Aspirin Ec 325 Mg Tbec (Aspirin) .... Take One Tablet By Mouth Daily 7)  Prilosec 20 Mg Cpdr (Omeprazole) .... Take 1 Tablet By Mouth Two Times A Day 8)  Amlodipine Besylate 10 Mg Tabs (Amlodipine Besylate) .... Take One Tablet By Mouth Daily  Allergies: No Known Drug Allergies  Past  History:  Past Medical History: ANGINA, UNSTABLE (ICD-411.1) NEPHROLITHIASIS, HX OF (ICD-V13.01) ESOPHAGEAL STRICTURE (ICD-530.3) CAD (ICD-414.00) DEGENERATIVE DISC DISEASE, LUMBAR SPINE (ICD-722.52) HYPERLIPIDEMIA (ICD-272.4) MYOCARDIAL INFARCTION, HX OF (ICD-412) HYPERTENSION (ICD-401.9) Anemia  Review of Systems       As per  the HPI.  All other systems reviewed and negative.   Vital Signs:  Patient profile:   65 year old male Height:      66 inches Weight:      180 pounds BMI:     29.16 Pulse rate:   60 / minute Resp:     14 per minute BP sitting:   96 / 52  (left arm)  Vitals Entered By: Kem Parkinson (January 23, 2010 12:05 PM)  Physical Exam  General:  Well nourished, well developed, in no acute distress HEENT: normal Neck: no JVD Cardiac:  normal S1, S2; RRR; no murmur Lungs:  clear to auscultation bilaterally, no wheezing, rhonchi or rales Abd: soft, nontender, no hepatomegaly Ext: no edema; RFA site without hematoma or bruit Skin: warm and dry Neuro:  CNs 2-12 intact, no focal abnormalities noted    EKG  Procedure date:  01/23/2010  Findings:      sinus bradycardia Heart rate 56 Leftward axis Inferior Q waves Nonspecific ST-T wave changes  Impression & Recommendations:  Problem # 1:  CAD (  ICD-414.00)  He is doing well without angina.  He will continue on aspirin and Plavix.  He is walking on his own without difficulty.  Followup with Dr. Excell Seltzer in 6-8 weeks.  Orders: EKG w/ Interpretation (93000) TLB-BMP (Basic Metabolic Panel-BMET) (80048-METABOL)  Problem # 2:  HYPERLIPIDEMIA (ICD-272.4) He really should not take simvastatin 40 mg with his amlodipine.  His HDL is low and his HDL is high.  I have recommended changing his simvastatin to Crestor 20 mg a day.  We will try to provide him with some samples.  He will followup with lipids and LFTs in 6-8 weeks.  Problem # 3:  HYPERTENSION (ICD-401.9) His blood pressure is running in the 90s  systolically.  He denies lightheadedness or near syncope or feelings of fatigue.  Continue current medications.  Creat was borderline high in the hospital.  Repeat bmet today.  Problem # 4:  Glucose Intolerance F/u with PCP.  Patient Instructions: 1)  Labwork today: bmet (414.01;401.1). 2)  Stop Simvastatin. 3)  Start Crestor 20mg  once daily- you have been given samples of this to start with . You have also been given a prescription so that you may price check this at your pharmacy.  If you find this is too expensive, please let us know. 4)  You will need to return for FASTING labwork in 6-8 weeks: lipid/liver (414.01;272.4). 5)  Your physician recommends that you schedule a follow-up appointment in: 8 weeks with Dr. Excell Seltzer.  Prescriptions: CRESTOR 20 MG TABS (ROSUVASTATIN CALCIUM) Take one tablet by mouth daily.  #30 x 6   Entered by:   Sherri Rad, RN, BSN   Authorized by:   Tereso Newcomer PA-C   Signed by:   Sherri Rad, RN, BSN on 01/23/2010   Method used:   Print then Give to Patient   RxID:   [id number]  I have personally reviewed the prescriptions today for accuracy. Tereso Newcomer PA-C  January 23, 2010 12:46 PM   Appended Document: eph The pt is taking lisinopril 20mg  two times a day instead of 10mg  two times a day.

## 2010-02-02 NOTE — Miscellaneous (Signed)
Summary: Orders Update  Clinical Lists Changes  Orders: Added new Test order of Arterial Duplex Lower Extremity (Arterial Duplex Low) - Signed 

## 2010-02-02 NOTE — Progress Notes (Signed)
Summary: refill**please note enclosed**   Phone Note Refill Request   Refills Requested: Medication #1:  LISINOPRIL 10 MG TABS take one tab two  times a day   Supply Requested: 1 year [facility],  states pharmacy does not have any refills   Method Requested: Fax to Local Pharmacy Initial call taken by: Migdalia Dk,  May 11, 2009 2:42 PM  Follow-up for Phone Call        Rx faxed to pharmacy Follow-up by: Oswald Hillock,  May 11, 2009 4:55 PM    Prescriptions: LISINOPRIL 10 MG TABS (LISINOPRIL) take one tab two  times a day  #60 x 5   Entered by:   Oswald Hillock   Authorized by:   Lenoria Farrier, MD, [facility]   Signed by:   Oswald Hillock on 05/11/2009   Method used:   Faxed to ...       [facility] [phone]* (retail)       [address]       [place], [place]  [address]       Ph: [phone]       Fax: [phone]   RxID:   [id number]

## 2010-02-15 NOTE — H&P (Signed)
[name], [name]                ACCOUNT NO.:  [id number]  MEDICAL RECORD NO.:  [id number]          PATIENT TYPE:  EMS  LOCATION:  MAJO                         FACILITY:  [facility]  PHYSICIAN:  Karn Cassis, MD  DATE OF BIRTH:  July 24, 1945  DATE OF ADMISSION:  01/07/2010 DATE OF DISCHARGE:                             HISTORY & PHYSICAL   PRIMARY CARDIOLOGIST:  Everardo Beals. Juanda Chance, MD, [facility] and Dr. Excell Seltzer.  PRIMARY CARE PHYSICIAN:  Dr. Donata Duff in [place], [place].  CHIEF COMPLAINT:  Chest pain.  HISTORY OF PRESENT ILLNESS:  [name] is a 65 year old male with history of coronary artery disease, status post four-vessel CABG in 1993, status post multiple PCIs.  His most recent cardiac catheterization is from March of 2010 which showed that LIMA to LAD was patent, his vein graft to the RCA was occluded, and the vein graft to the posterior lateral branch off left circumflex as well as a vein graft to the obtuse marginal were patent.  During his March of 2010 hospitalization, he underwent a PCI to the distal RCA with a Xience stent and mid RCA with a Cypher stent.  Since his discharge in 2010, he has done relatively well.  He was in his usual state of health up until yesterday evening when he developed substernal chest tightness radiating to his arm.  He reports that he "just could not get comfortable."  He came to the emergency department because his symptoms were similar with those to his previous anginal equivalent.  His two sets of cardiac markers that were performed in the ED were negative.  He was started on heparin and nitroglycerin.  At this point, I was asked for an admission.  On my interview, he reports that his pain is down from 8/10 at its peak to 2/10.  He is currently on nitroglycerin drip which is running at 1.5 mcg.  In addition to the chest pain, he has noticed symptoms of leg cramps, does occasionally occur with exercise but can also occur at rest.  As  far as the patient recalls, he has not been evaluated for peripheral vascular disease.  PAST MEDICAL HISTORY: 1. Coronary artery disease, status post four-vessel bypass graft in     1993.  His most recent cardiac cath was in March of 2010.  The     results of which are outlined in HPI above. 2. Hypertension. 3. Hyperlipidemia. 4. Degenerative disk disease involving the neck and back. 5. History of multiple kidney stones, status post bilateral     lithotripsy. 6. Last echo is from 2007 which shows normal left ventricular     contractile function.  SOCIAL HISTORY:  [place].  They also have a home at [facility].  He is retired from [employer].  He has a remote history of smoking but quit tobacco in 1989. He denies any history of alcohol abuse or illicit drug use.  FAMILY HISTORY:  Mother died of coronary artery disease.  Mother underwent CABG.  Father died at young age.  The etiology is not clear. His siblings all  have known coronary artery disease and several underwent bypass surgeries and PCIs.  REVIEW OF SYSTEMS:  A 12 review of systems is negative except for symptoms outlined in HPI above.  ALLERGIES:  No known drug allergies.  CURRENT MEDICATIONS:  Include: 1. Lisinopril 10 mg p.o. b.i.d. 2. Simvastatin 40 mg p.o. every day. 3. Metoprolol 50 mg p.o. b.i.d. 4. Plavix 75 mg once a day. 5. Aspirin 325 mg once a day. 6. Niacin 500 mg (this is listed as one of his home medications, but     the patient does not take this secondary to flushing).  PHYSICAL EXAMINATION:  Vital Signs:  His blood pressure on arrival was 193/81, it is currently down to 177/83; his temperature on arrival was 98.3; pulse of 70; he is saturating 98% on room air. GENERAL:  Well-appearing male, in no apparent distress. CARDIOVASCULAR:  Regular rate and rhythm.  Normal S1, S2.  No murmurs, gallops or rubs. LUNGS:  Clear to auscultate except for faint  crackles at the left base. ABDOMEN:  Obese, soft, nontender, nondistended. EXTREMITIES:  No signs of cyanosis or clubbing. PSYCH:  Alert and oriented. SKIN:  No rashes or lesions.  LABORATORY DATA:  His first two sets of cardiac markers are within normal limits including troponin and CK-MB.  CBC shows white count of 5.8, hematocrit 39 and a platelet count of 206.  His chemistries are unremarkable except for glucose of 195.  A 12-lead EKG shows normal sinus rhythm with heart rate of 76 and poor R-wave progression in anterior precordial leads.  ASSESSMENT AND PLAN:  In summary, this is a 65 year old male with history of coronary artery disease, status post four-vessel CABG.  His vein graft is known to be occluded to the RCA.  He underwent two stents to the RCA during his last hospitalization in 2010.  He is being admitted for unstable angina. 1. Unstable angina:  His blood pressure on arrival was 193/81.  He     continues to be hypertensive with blood pressure in 170s systolic.     I will increase the dose of his nitroglycerin drip with a goal to     titrate for chest pain relief.  In addition, I will start him on     more aggressive antihypertensive therapy including doubling his     lisinopril from 10 mg to 20 mg b.i.d., we will also add amlodipine     10 mg once a day and continue his home dose of metoprolol 50 mg     p.o. b.i.d.  I will switch him from his simvastatin 40 mg to     Lipitor 80 mg once a day.  Since he has not been able to tolerate     niacin due to side effect, Zetia could be considered in the future.     He will be started on aspirin, Plavix and heparin until three sets     of cardiac markers return.  I will lastly order transthoracic echo     since it has been several years that his LV function had been     quantified. 2. Hypertension as mentioned above. 3. Hyperlipidemia:  We will send for fasting lipid panel for better     risk stratification. 4. Elevated  glucose:  The patient does not carry a diagnosis of     diabetes, but his sugars are 190s.  We will send for a hemoglobin     A1c and check a fasting blood glucose in the  morning.     Karn Cassis, MD     PV/MEDQ  D:  01/08/2010  T:  01/08/2010  Job:  [id number]  Electronically Signed by Karn Cassis MD on 02/15/2010 01:22:09 PM

## 2010-02-22 NOTE — Medication Information (Signed)
Summary: Physician's Orders   Physician's Orders   Imported By: Roderic Ovens 02/16/2010 15:47:35  _____________________________________________________________________  External Attachment:    Type:   Image     Comment:   External Document

## 2010-03-07 ENCOUNTER — Encounter: Payer: Self-pay | Admitting: Cardiovascular Disease

## 2010-03-07 ENCOUNTER — Encounter: Payer: Self-pay | Admitting: Physician Assistant

## 2010-03-20 ENCOUNTER — Encounter: Payer: Self-pay | Admitting: Cardiovascular Disease

## 2010-03-21 ENCOUNTER — Encounter: Payer: Self-pay | Admitting: Cardiovascular Disease

## 2010-03-21 ENCOUNTER — Encounter: Payer: Self-pay | Admitting: Cardiology

## 2010-03-21 ENCOUNTER — Ambulatory Visit (INDEPENDENT_AMBULATORY_CARE_PROVIDER_SITE_OTHER): Payer: Medicare Other | Admitting: Cardiovascular Disease

## 2010-03-21 VITALS — BP 124/70 | HR 63 | Resp 18 | Ht 72.0 in | Wt 184.1 lb

## 2010-03-21 DIAGNOSIS — I2510 Atherosclerotic heart disease of native coronary artery without angina pectoris: Secondary | ICD-10-CM

## 2010-03-21 DIAGNOSIS — E785 Hyperlipidemia, unspecified: Secondary | ICD-10-CM

## 2010-03-21 DIAGNOSIS — I10 Essential (primary) hypertension: Secondary | ICD-10-CM

## 2010-03-21 DIAGNOSIS — I259 Chronic ischemic heart disease, unspecified: Secondary | ICD-10-CM

## 2010-03-21 DIAGNOSIS — I25810 Atherosclerosis of coronary artery bypass graft(s) without angina pectoris: Secondary | ICD-10-CM

## 2010-03-21 LAB — HEPATIC FUNCTION PANEL
AST: 23 U/L (ref 0–37)
Albumin: 3.8 g/dL (ref 3.5–5.2)
Total Bilirubin: 0.5 mg/dL (ref 0.3–1.2)

## 2010-03-21 NOTE — Assessment & Plan Note (Signed)
The patient is stable without exertional angina. He had one fleeting episode of chest pain as per the history of present illness, but this does not sound anginal in nature. I would recommend continuation of his current medical program which includes dual antiplatelet therapy with aspirin and Plavix. He will followup in 6 months. If he has recurrent chest pain he will notify us immediately.

## 2010-03-21 NOTE — Assessment & Plan Note (Signed)
The patient is tolerating rosuvastatin and his lipids were reviewed as below. He is due for followup lipids and LFTs which will be drawn today. His LDL has been at goal and HDL is low.

## 2010-03-21 NOTE — Assessment & Plan Note (Signed)
Blood pressure is at goal on current medical therapy.

## 2010-03-21 NOTE — Progress Notes (Signed)
HPI: This is a 65 year old gentleman with coronary artery disease and prior coronary bypass surgery. He recently presented with non-ST elevation infarction and required percutaneous intervention of his vein graft to the obtuse marginal branch. The patient was treated with 2 drug-eluting stents. He presents today for followup evaluation.  The patient reports an episode of chest pain about 3 weeks ago that occurred while he was working on his boat. The symptoms felt just like what he had at the time of his NSTEMI. However, the episode lasted only a few seconds.  He hasn't had any other symptoms since then. He denies dyspnea, edema, palpitations, lightheadedness, or syncope.   He is unable to walk much because of bilateral hip pain and low back pain. He also complains of numbness in his fingers. No other complaints today.  Outpatient Encounter Prescriptions as of 03/21/2010  Medication Sig Dispense Refill  . aspirin 81 MG tablet Take 81 mg by mouth daily.       . clopidogrel (PLAVIX) 75 MG tablet Take 75 mg by mouth daily.        Marland Kitchen lisinopril (PRINIVIL,ZESTRIL) 20 MG tablet Take 20 mg by mouth daily.        . metoprolol (LOPRESSOR) 50 MG tablet Take 50 mg by mouth 2 (two) times daily.        . nitroGLYCERIN (NITROSTAT) 0.4 MG SL tablet Place 0.4 mg under the tongue every 5 (five) minutes as needed.        Marland Kitchen omeprazole (PRILOSEC OTC) 20 MG tablet Take 1 mg by mouth 2 (two) times daily.        . rosuvastatin (CRESTOR) 20 MG tablet Take 20 mg by mouth daily.        Marland Kitchen DISCONTD: amLODipine (NORVASC) 10 MG tablet Take 10 mg by mouth daily.         No Known Allergies  Past Medical History  Diagnosis Date  . Angina pectoris, crescendo     CABG 1993, PCI of SVG to OM Jan 2012 (Drug-eluting stent x 2)  . History of nephrolithiasis   . Stricture and stenosis of esophagus   . Coronary artery disease   . Degeneration of lumbar or lumbosacral intervertebral disc   . Hyperlipidemia   . Hypertension   .  Anemia     Past Surgical History  Procedure Date  . Coronary artery bypass graft 1993  . Lithotripsy    ROS: Negative except as per history of present illness.  PHYSICAL EXAM: BP 124/70  Pulse 63  Resp 18  Ht 6' (1.829 m)  Wt 184 lb 1.9 oz (83.516 kg)  BMI 24.97 kg/m2  Pt is alert and oriented, overweight male in NAD HEENT: normal Neck: JVP - normal, carotids 2+= without bruits Lungs: CTA bilaterally CV: RRR without murmur or gallop Abd: soft, NT, Positive BS, no hepatomegaly Ext: no C/C/E, distal pulses intact and equal Skin: warm/dry no rash  EKG: Normal sinus rhythm 63 beats per minute, within normal limits.  ASSESSMENT AND PLAN:

## 2010-03-21 NOTE — Patient Instructions (Signed)
You will f/u with Dr. Excell Seltzer in 6 months.

## 2010-03-22 LAB — LIPID PANEL
Total CHOL/HDL Ratio: 5
Triglycerides: 139 mg/dL (ref 0.0–149.0)

## 2010-03-27 ENCOUNTER — Other Ambulatory Visit: Payer: Medicare Other | Admitting: *Deleted

## 2010-04-13 LAB — BASIC METABOLIC PANEL
CO2: 28 mEq/L (ref 19–32)
Chloride: 107 mEq/L (ref 96–112)
GFR calc Af Amer: 59 mL/min — ABNORMAL LOW (ref >60)
Potassium: 4.7 mEq/L (ref 3.5–5.1)

## 2010-04-13 LAB — CBC
HCT: 32.3 % — ABNORMAL LOW (ref 39.0–52.0)
Hemoglobin: 11.2 g/dL — ABNORMAL LOW (ref 13.0–17.0)
MCHC: 34.7 g/dL (ref 30.0–36.0)
MCV: 85.9 fL (ref 78.0–100.0)
RBC: 3.76 MIL/uL — ABNORMAL LOW (ref 4.22–5.81)
WBC: 5.9 10*3/uL (ref 4.0–10.5)

## 2010-05-16 NOTE — Assessment & Plan Note (Signed)
[facility]                            CARDIOLOGY OFFICE NOTE   [name], [name]                         MRN:          [id number]  DATE:03/05/2008                            DOB:          1945-10-14    PRIMARY CARE PHYSICIAN:  Donata Duff in [place].   CLINICAL HISTORY:  [name] is 65 years old and was seen today for an  unscheduled visit because of recent onset of chest pain.  He was seen in  followup in the TRA2P study and gave a history of exertional chest pain  over the last week.   He had bypass surgery in 1993 and had 3 overlapping drug-eluting stents  in the right coronary artery, the last of which was in 2007.  In August  2009, he had stenting of the vein graft to the circumflex system and in  October, he had cutting balloon angioplasty for in-stent restenosis in  the distal right coronary artery.   PAST MEDICAL HISTORY:  Significant for degenerative disk disease in the  neck and back, hypertension, and hyperlipidemia.   CURRENT MEDICATIONS:  1. Plavix.  2. Aspirin.  3. TRA2P study drug.  4. Metoprolol 50 mg daily.  5. Lisinopril 10 mg b.i.d.  6. Lovastatin 40 mg daily.   PHYSICAL EXAMINATION:  VITAL SIGNS:  The blood pressure was 148/81 and  the pulse 60 and regular.  NECK:  There was no venous distension.  The carotid pulses were full and  there were no bruits.  CHEST:  Clear.  CARDIAC:  The heart rhythm is regular.  There are no murmurs or gallops.  ABDOMEN:  Soft, normal bowel sounds.  EXTREMITIES:  Peripheral pulses are full with no peripheral edema.   IMPRESSION:  1. Exertional chest pain consistent with angina.  I suspect restenosis      in the distal right coronary artery.  2. Coronary artery disease status post coronary artery bypass graft      surgery in 1993, status post multiple stent procedures in the mid      right coronary artery.  Remotely, status post stenting of the vein      graft to the circumflex artery  in 2008 and status post cutting      balloon angioplasty for in-stent restenosis in the distal right      coronary artery in October 2009.  3. Hypertension.  4. Hyperlipidemia.  5. Degenerative disk disease of the neck and back.   RECOMMENDATIONS:  [name] symptoms are fairly typical for angina and I  suspect he has developed restenosis in the distal right coronary artery.  We will plan to bring him in for evaluation with catheterization and  possible angioplasty.  I will do them in the upstairs labs and if I  think it is highly likely, he will need an intervention.     Bruce Elvera Lennox Juanda Chance, MD, [facility]  Electronically Signed    BRB/MedQ  DD: 03/05/2008  DT: 03/06/2008  Job #: [id number]

## 2010-05-16 NOTE — Assessment & Plan Note (Signed)
[facility]                            CARDIOLOGY OFFICE NOTE   KEEN, EWALT                         MRN:          [id number]  DATE:03/11/2007                            DOB:          1945/04/27    PRIMARY CARE PHYSICIAN:  Donata Duff, MD   CLINICAL HISTORY:  Mr. Upshaw is 65 years old and returns for management  of his coronary heart disease.  He had bypass surgery in 1993 and 2007  had a non-ST elevation infarction with subsequent treatment of the right  coronary lesion with tandem overlapping drug-eluting stents.  He had  recurrent symptoms and had a third stent placed for a peri-stent  restenosis in July 2007.  He has done well since that time.  Does say  when he walks up a hill he gets a little tight and a little short of  breath,  but this has not changed and this is not very disabling for  him.  He rarely takes nitroglycerin.   PAST MEDICAL HISTORY:  1. Hyperlipidemia.  2. Degenerative disc disease.   CURRENT MEDICATIONS:  1. Lisinopril 5 mg daily.  2. Lovastatin 40 mg 2 tablets at bedtime.  3. metoprolol 50 mg b.i.d.  4. Clopidogrel 75 mg daily.  5. Aspirin.   PHYSICAL EXAMINATION:  Blood pressure is 178/90 and pulse 60 and  regular.  There was no venous tension.  The carotid pulses are full  without bruits.  CHEST:  Was clear.  CARDIAC:  Rhythm was regular.  I could hear no murmurs or gallops.  ABDOMEN:  Soft, normal bowel sounds. There was no hepatosplenomegaly.  Peripheral pulses were full with no peripheral edema.   Electrocardiogram was normal.   IMPRESSION:  1. Coronary artery disease status post coronary artery bypass surgery      in 1993 and status post multiple percutaneous intervention of the      right coronary with drug-eluting stents as described.  2. Good left ventricular function.  3. Hyperlipidemia.  4. Degenerative disease of the lumbar spine.  5. Hypertension, under optimal control.   RECOMMENDATIONS:   I think Mr. Grahn is doing well from a cardiac  standpoint.  Blood pressure is quite high.  Will increase his lisinopril  from 5 to 10 a day.  I asked him to check with  Dr. Mora Appl in about 2 weeks for repeat blood pressure check and he can  decide about further adjustments.  I will plan to see him back in follow-  up in the year.     Bruce Elvera Lennox Juanda Chance, MD, [facility]  Electronically Signed    BRB/MedQ  DD: 03/11/2007  DT: 03/11/2007  Job #: [id number]

## 2010-05-16 NOTE — Cardiovascular Report (Signed)
NAMECALVEN, GILKES                ACCOUNT NO.:  [id number]   MEDICAL RECORD NO.:  [id number]          PATIENT TYPE:  INP   LOCATION:  2504                         FACILITY:  [facility]   PHYSICIAN:  Everardo Beals. Juanda Chance, MD, FACCDATE OF BIRTH:  1945/08/21   DATE OF PROCEDURE:  03/09/2008  DATE OF DISCHARGE:                            CARDIAC CATHETERIZATION   CLINICAL HISTORY:  Mr. Tadros is 65 years old and has had prior bypass  surgery and has multiple percutaneous interventions.  He has 3  overlapping stents in the proximal mid to distal right coronary artery,  and he had cutting balloon angioplasty for peri-stent restenosis 6  months ago.  He also had stents placed in the proximal and midportion of  the vein graft to circumflex artery by Dr. Excell Seltzer about 8 months ago.  He now has recurrent angina.   PROCEDURE:  The procedure was performed by the right femoral artery,  arterial sheath, and 5-French preformed coronary catheters.  A front  wall arterial puncture was performed and Omnipaque contrast was used.  A  LIMA catheter was used for injection of the LIMA graft.  An AL-1  catheter was used for injection of the vein graft to the right coronary  artery.   After the completion of diagnostic study, I made a decision to proceed  with intervention on the lesions in the mid and distal right coronary  artery.  The patient was given Angiomax bolus infusion and was given  additional 300 mg of Plavix.  He takes chronic Plavix therapy.  We  passed a Prowater wire down the right coronary artery without too much  difficulty.  We predilated the distal and mid lesions with a 2.5 x 15-mm  Voyager balloon performing inflations up to 12 atmospheres for 30  seconds.  We then deployed a 2.5 x 18-mm stent in the distal right  coronary artery distal to the previously placed stents and overlapping  the previously placed stent.  We deployed this with 1 inflation of 10  atmospheres for 30 seconds.  We then  deployed a 2.75 x 33-mm Cypher  stent extending from just outside the ostium to the midvessel.  We  deployed this with 1 inflation of 15 atmospheres for 30 seconds.  We  then postdilated the distal stent with a 2.75 x 15-mm [place] Voyager  performing 2 inflations up to 18 atmospheres for 30 seconds.  We then  postdilated the mid Cypher stent with a 3.25 x 20-mm [place] Voyager  performing 2 inflations up to 18 atmospheres for 30 seconds.  Final  diagnostics were then performed through the guiding catheter.  The  patient tolerated the procedure well and left laboratory in satisfactory  condition.   RESULTS:  Left main coronary artery:  Left main coronary artery was free  of significant disease.   Left anterior descending artery:  Left anterior descending artery had a  90% proximal stenosis and a 90% stenosis in the first diagonal branch  and then competing flow distally.   Circumflex artery:  The circumflex artery was completed occluded  proximally after a small ramus  branch.   Right coronary artery:  The right coronary artery was a moderate-sized  vessel that had stents from the proximal to the mid-to-distal portion.  There was an 80% narrowing in the mid stent extending to the proximal  stent.  There was 80% at the distal edge of the distal stent.   The LIMA graft to LAD was patent and functioned normally.   The saphenous vein grafts to the marginal and posterolateral branch of  circumflex artery was patent.  There was 0% stenosis at the stent sites  in the mid and distal stent.  There was 40% narrowing at the ostium  which had not changed.   The saphenous vein graft to the right coronary artery was totalled.  This was a chronic total occlusion.   Following stenting of the distal right coronary artery lesion which is a  peri-stent lesion, the stenosis improved from 80% to 0%.  Following  stenting of the lesion in the mid right coronary which was an in-stent  restenotic lesion, the  stenosis improved from 80% to 0%.   CONCLUSION:  1. Coronary artery disease status post remote coronary artery bypass      graft surgery.  2. Severe native vessel disease with 90% stenosis in the proximal LAD      and 90% stenosis in the diagonal branch of the LAD, total occlusion      of circumflex artery proximally, 80% stenosis within the stent in      the mid right coronary artery, and 80% stenosis at the edge of the      stent in the distal right coronary artery.  3. Patent vein grafts to the marginal and posterolateral branch of      circumflex artery with 0% stenosis at the stent sites in the      proximal and mid graft, patent LIMA graft to the LAD, and an      occluded vein graft to the right coronary artery (old).  4. Successful PCI of the lesion at the distal edge of the previously      placed stents in the distal right coronary artery using a Xience      drug-eluting stent with improvement in center narrowing from 80% to      0%.  5. Successful PCI of the in-stent restenotic lesion in the mid right      coronary artery using a Cypher drug-eluting stent with improvement      in center narrowing from 80% to 0%.   DISPOSITION:  The patient returned to post angio unit for further  observation.      Bruce Elvera Lennox Juanda Chance, MD, [facility]  Electronically Signed     BRB/MEDQ  D:  03/09/2008  T:  03/10/2008  Job:  [id number]   cc:   Donata Duff  Cardiopulmonary Lab

## 2010-05-16 NOTE — H&P (Signed)
[facility] ADMISSION   [name], [name]                         MRN:          [id number]  DATE:10/01/2007                            DOB:          [birth year]    PRIMARY CARE PHYSICIAN:  Dr. Jabier Gauss, [place].   CHIEF COMPLAINT:  Chest tightness.   HISTORY OF PRESENT ILLNESS:  This is a 65 year old married white male  patient with history of coronary artery disease, who is being readmitted  to have PCI on 90% in-stent restenosis of the RCA.  This was found back  in August, but procedure was not performed because the patient did not  have insurance at that time.  He now has insurance.  The patient has a  history of coronary artery disease, status post CABG in 1993; with a  LIMA to the LAD and second diagonal, SVG to OM-1 and OM-2, SVG to the  acute marginal and distal RCA, and SVG to the RCA.  He had a non-ST  elevation MI in April 2007, treated with tandem drug-eluting stents to  the RCA.  In July 2007 he had Taxus stent to the RCA for peri stent  restenosis.  He had a non-ST elevation MI in August 2009, treated with 2  Promise drug-eluting stents to the SVG to the OM and PLA.  He also has a  tight in-stent restenosis at the distal edge of the RCA stent that has  need to be dilated, but the patient had no insurance and he has been  treated medically until could he could gain insurance.   The patient complains of chest tightness when he walks about 3/4 of a  mile; if he sits it eases immediately.  He also will develop chest  tightness if he goes up any type of incline.  He denies any radiation of  the pain, dyspnea on exertion, palpitations, dizziness or presyncope.   ALLERGIES:  NO KNOWN DRUG ALLERGIES.   CURRENT MEDICATIONS:  1. Metoprolol 50 mg b.i.d.  2. Plavix 75 mg daily.  3. Lovastatin 40 mg daily.  4. Lisinopril 10 mg daily.  5. Aspirin 325 mg daily.  6. Lasix 20 mg p.r.n. He was  prescribed this today.   PAST MEDICAL HISTORY:  1. Hyperlipidemia.  2. Hypertension.  3. History of degenerative joint disease of the lumbar spine, status      post surgery in the past.  4. History of good LV function.  5. History of multiple kidney stones, status post bilateral      lithotripsy.   SOCIAL HISTORY:  He is married.  He lives in [place], [place] with his wife.  He  has eight grandchildren.  He is retired Arts administrator.  He quit  smoking in 1989.   FAMILY HISTORY:  Mother died of coronary artery disease, status post  CABG.  Father died of a young age, questioned etiology.  He has 6  brothers and 4 sisters, all with coronary artery disease.  Four brothers  have passed away and 2 sisters are still living (but have  severe  coronary disease).   REVIEW OF SYSTEMS:  Negative for dizziness or presyncopal signs or  symptoms, dyspepsia, dysphagia, nausea, vomiting, change in bowels or  melena.  CARDIOPULMONARY:  Please see HPI.   PHYSICAL EXAMINATION:  This is a pleasant 65 year old white male in no  acute distress.  Blood pressure 159/84, pulse 53, weight 182.  HEENT:  Head is normocephalic without sign of trauma.  Extraocular  movements are intact.  Pupils are equal and reactive to light and  accommodation.  Oral mucosa is moist.  Throat is without erythema or  exudate.  NECK:  Without JVD, HJR, bruit or thyroid enlargement.  LUNGS:  Clear anterior, posterior and lateral.  HEART:  Regular rate and rhythm at 53 beats per minute.  Normal S1 and  S2; no murmur, rub, bruit, thrill or heave noted.  ABDOMEN:  Soft without organomegaly, masses, lesions or abnormal  tenderness.  EXTREMITIES:  Without cyanosis, clubbing or edema.  He has good distal  pulses.  NEUROLOGICAL:  Exam is without focal deficit.   EKG:  Sinus bradycardia, nonspecific ST-T wave changes.  No acute  change.   IMPRESSION:  1. Coronary artery disease:  With distal edge stent restenosis of the      right  coronary artery; for percutaneous coronary intervention with      possible drug-eluting stent placement scheduled.  2. Status post coronary artery bypass grafting in 1993, with a left      internal mammary to the left anterior descending artery and second      diagonal, saphenous vein graft to the OM-1 and OM-2; saphenous vein      graft to the acute marginal and distal right coronary artery; and      saphenous vein graft to the right coronary artery.  3. Status post non-ST elevation myocardial infarction in April 2007;      treated with tandem drug-eluting stent to the right coronary      artery.  4. Status post Taxus stent to the right coronary artery for peri stent      restenosis in July 2007.  5. Non-ST elevation myocardial infarction in August 2009, with 2      Promise drug-eluting stents to the saphenous vein graft to the OM      and PLA; with residual in-stent restenosis of the right coronary      artery at the distal edge of the stent.  6. Good left ventricular function.  7. Hyperlipidemia.  8. Hypertension.  9. Degenerative joint disease of the lumbar spine, status post back      surgery.  10.History of nephrolithiasis and lithotripsy.   PLAN:  Will admit the patient for PCI and probable drug-eluting stent to  the distal edge of the stent in the RCA for restenosis.  The patient  understands and is agreeable.      Jacolyn Reedy, PA-C  Electronically Signed      Everardo Beals. Juanda Chance, MD, [facility]  Electronically Signed   ML/MedQ  DD: 10/01/2007  DT: 10/01/2007  Job #: [id number]   cc:   Dr. Jabier Gauss, [place]

## 2010-05-16 NOTE — H&P (Signed)
[name], [name]                ACCOUNT NO.:  [id number]   MEDICAL RECORD NO.:  [id number]          PATIENT TYPE:  INP   LOCATION:  6522                         FACILITY:  [facility]   PHYSICIAN:  Jesse Sans. Wall, MD, FACCDATE OF BIRTH:  November 14, 1945   DATE OF ADMISSION:  08/10/2007  DATE OF DISCHARGE:                              HISTORY & PHYSICAL   PRIMARY CARDIOLOGIST:  Everardo Beals. Juanda Chance, MD, [facility].   PRIMARY CARE:  Donata Duff.   Mr. Lorentz is a delightful 65 year old Caucasian gentleman with extensive  history of coronary artery disease who presents to [facility] Emergency  Room today with complaints of chest discomfort.  [place]ac[place].  Yesterday he began to have some chest discomfort.  He states he could only walk a few feet before he would have to stop  because of the discomfort and shortness of breath.  The angina would  resolve with rest but he just felt more weak and fatigued than usual.  His discomfort in the past associated with his angina usually is across  his back and he describes as a sharp pain.  However, this admission, his  discomfort is across his chest and under both arms but it presents in  the same way as his previous angina episodes have with a sharp constant  discomfort associated with increased fatigue, weakness and brought on  with exertion.  He took some nitroglycerin yesterday with relief but the  chest discomfort would come back later in the day.  Went to bed, woke up  this morning around 4:00 a.m.  He states the discomfort was present  then.  He took nitroglycerin without relief.  He instructed his wife to  go ahead and drive him back here to [place].  Wife want to take him  to [place], but the patient refused, made it back here to the emergency  room.  On arrival, he was having a chest discomfort.  He was given two  nitroglycerin with relief and currently is pain free.   PAST MEDICAL HISTORY:  1. Coronary artery  disease status post CABG in 1993, status post      multiple PCIs of the right coronary with drug-eluting stents.  Most      recent catheterization in 2007 at which time the patient had a non-      ST elevated MI.  2. Hyperlipidemia.  3. Degenerative disk disease status post back surgery.  4. Multiple kidney stones.  The patient states he is past well over      100 stones and has had a history of bilateral lithotripsy.   SOCIAL HISTORY:  [place] in [place].  They  also have a place down at [facility].  He is a retired Neurosurgeon.  He quit using tobacco in 1989.  Tries to follow a heart healthy  diet.  Denies any drug or alcohol substances.   FAMILY HISTORY:  Mother deceased, history of coronary artery disease  status post CABG, father deceased at a young age, not  clear etiology,  siblings all with known coronary artery disease, several status post  bypass.   REVIEW OF SYSTEMS:  Positive for chest pain, shortness of breath,  dyspnea on exertion, weakness, chronic myalgia, arthralgias in back.  Denies any recent hematuria, states he did pass a kidney stone last  week.   No known drug allergies.   Medicines include:  1. Plavix 75.  2. Aspirin 325.  3. Lovastatin 40.  4. Lisinopril 10.  5. Metoprolol 50 b.i.d.   PHYSICAL EXAMINATION:  VITAL SIGNS:  Temperature 97.3,, heart rate 62,  blood pressure 153/88, currently 133/73, respirations 18, heart rate 53-  62, sat 96% on room air.  GENERAL:  No acute distress.  HEENT:  Unremarkable.  NECK:  Supple without lymphadenopathy.  No bruits.  No JVD.  CHEST:  Clear to auscultation bilaterally.  CARDIOVASCULAR:  S1-S2, regular rate and rhythm.  SKIN:  Warm and dry.  ABDOMEN:  Soft, nontender, positive bowel sounds.  LOWER EXTREMITIES:  Without clubbing, cyanosis or edema.  NEUROLOGICAL:  Alert and oriented x3.   Chest x-ray showing no active disease, minimal basilar atelectasis.  EKG  sinus brady  at a rate of 51, no acute ST or T-wave changes. Lab work  available at this time.  First set of point of cares are negative.  WBCs  5.8, H&H 14.7 and 43.1, platelets are 187,000.  PT/INR 13.3 and 1.0.  Sodium 142, potassium 4.6, chloride 108, BUN 20, and creatinine 1.5   IMPRESSION:  Unstable angina with known history of coronary artery  disease status post previous bypass with multiple interventions.  The  patient needs cardiac catheterization for further evaluation.  Would  initiate heparin and nitroglycerin therapy and plan on cath in the a.m.  Dr. Valera Castle has been into examine and assess the patient and agrees  with the plan of care.      Dorian Pod, ACNP      Jesse Sans. Daleen Squibb, MD, [facility]  Electronically Signed    MB/MEDQ  D:  08/10/2007  T:  08/11/2007  Job:  [id number]

## 2010-05-16 NOTE — Discharge Summary (Signed)
NAMEOLLIE, ESTY                ACCOUNT NO.:  [id number]   MEDICAL RECORD NO.:  [id number]          PATIENT TYPE:  INP   LOCATION:  2921                         FACILITY:  [facility]   PHYSICIAN:  Veverly Fells. Excell Seltzer, MD  DATE OF BIRTH:  1945-08-07   DATE OF ADMISSION:  08/10/2007  DATE OF DISCHARGE:  08/12/2007                               DISCHARGE SUMMARY   PROCEDURES:  1. Cardiac catheterization.  2. Coronary arteriogram.  3. Left ventriculogram.  4. Promus drug-eluting stent x2.   PRIMARY FINAL DISCHARGE DIAGNOSIS:  Non-ST segment elevation myocardial  infarction.   SECONDARY DIAGNOSES:  1. Status post aortocoronary bypass surgery in 1993 with left internal      mammary artery to left anterior descending, sequential to second      diagonal branch, saphenous vein graft to first obtuse marginal      artery, sequential to second obtuse marginal artery, saphenous vein      graft to acute marginal, sequential to distal right coronary      artery, saphenous vein graft to right coronary artery is occluded      by cath this admission.  2. Residual 90% stenosis in the distal stent and the right coronary      artery, is to be treated with staged percutaneous intervention.  3. Dyslipidemia.  4. Degenerative disk disease status post back surgery.  5. History of nephrolithiasis and lithotripsy.  6. Family history of coronary artery disease.  7. History of non-ST segment elevation myocardial infarction with drug-      eluting stent to the right coronary artery in April 2007.   TIME AT DISCHARGE:  41 minutes.   HOSPITAL COURSE:  Mr. Maclellan is a 65 year old male with known coronary  artery disease.  He developed chest discomfort and came to the hospital  where he was admitted for further evaluation.   He had some elevation in his cardiac enzymes with a CK-MB of 57/5.7  indicating a non-ST segment elevation MI.  He was taken to the cath lab  on August 11, 2007.  He had a 99% stenosis in  the SVG to OM and PL.  This was treated with PTCA and two Promus drug-eluting stents reducing  the stenosis to 0.  He had significant disease with a 90% in-stent  restenosis in the RCA, but it was felt that this should be treated with  staged intervention, which will not be done this admission.  The LIMA to  LAD was patent, and there was no critical distal disease.   On August 12, 2007, Mr. Carver was ambulating without chest pain or  shortness of breath.  No changes were made to his medications and  compliance with a heart-healthy diet was encouraged.  Mr. Dafoe  indicated that he would like to wait until October 2, to do the staged  intervention to the RCA and he will discuss this with Dr. Juanda Chance.  Cardiac rehab saw Mr. Scherzer and discussed cardiac risk factor reduction  and a walking program.  Mr. Vanderveen was ambulating without chest pain or  shortness of breath and considered  stable for discharge with outpatient  followup arranged.   DISCHARGE INSTRUCTIONS:  His activity level is to be increased gradually  with no driving for four days and no lifting for two weeks.  He is to  call our office for any problems with the cath site.  He is to stick to  a heart-healthy diet.  He is to follow up with Dr. Juanda Chance on August 26,  at 10:15 and with Dr. Mora Appl as needed.   DISCHARGE MEDICATIONS:  1. Lovastatin 40 mg daily.  2. Plavix 75 mg a day.  3. Metoprolol 50 mg b.i.d.  4. Lisinopril 10 mg a day.  5. Aspirin 325 mg daily.  6. Sublingual nitroglycerin p.r.n.      Theodore Demark, PA-C      Veverly Fells. Excell Seltzer, MD  Electronically Signed    RB/MEDQ  D:  08/12/2007  T:  08/13/2007  Job:  [id number]   cc:   Donata Duff

## 2010-05-16 NOTE — Assessment & Plan Note (Signed)
[facility]                            CARDIOLOGY OFFICE NOTE   ANIAS, [name]                         MRN:          [id number]  DATE:08/27/2007                            DOB:          Oct 10, 1945    HISTORY OF PRESENT ILLNESS:  [name] is 65 years old, who comes in for  followup __________ for a non-ST-elevation myocardial infarction.  He  has had previous bypass surgery in 1993 and redo in 2007, and has had  tandem overlapping drug-eluting stents placed in the right coronary  artery with placement of a third stent in July 2007 for peri-stent  restenosis.  He was hospitalized with a non-ST-elevation myocardial  infarction and found to have tight lesions in the vein graft to the  circumflex artery and had two non-overlapping Promus stent placed by Dr.  Excell Seltzer.  He also has a tight in-stent restenotic lesion in the right  coronary artery, which was planned to be staged following discharge.   He states he has had no recurrent chest pain since discharge.   PAST MEDICAL HISTORY:  Significant for hyperlipidemia, degenerative disk  disease.   CURRENT MEDICATIONS:  1. Metoprolol 50 mg b.i.d.  2. Clopidogrel.  3. Lovastatin 40 mg daily.  4. Lisinopril 10 mg daily.  5. Aspirin 325 mg daily.   PHYSICAL EXAMINATION:  VITAL SIGNS:  Blood pressure 118/77, pulse 55 and  regular.  NECK:  There was no venous distension.  The carotid pulses were full  without bruits.  CHEST:  Clear.  CARDIAC:  Rhythm was regular.  No murmurs or gallops.  ABDOMEN:  Soft.  Normal bowel sounds.  EXTREMITIES:  Peripheral pulses are full.  No edema.   An electrocardiogram showed sinus bradycardia with a rate of 51 and a  possible diaphragmatic wall infarction.   IMPRESSION:  1. Coronary artery disease status post  coronary artery bypass graft      surgery in 1993 and status post percutaneous coronary intervention      in the right coronary artery in 2007 with 3  drug-eluting stents and      status post percutaneous coronary intervention in July 2009 with      non-overlapping drug-eluting stents in the vein graft to circumflex      artery with residual tight in-stent restenosis in the right      coronary artery of 90%.  2. Good left ventricular function.  3. Hyperlipidemia.  4. Hypertension.  5. Degenerative disease of lumbar spine.   RECOMMENDATIONS:  [name] is doing better and appears stable.  He has  a tight in-stent restenotic lesion in the right coronary artery, which  needs to be fixed.  Unfortunately, currently he does not have any  insurance and hopes to hold off until October 2009 when his Medicare  will become effective.  He is now on disability and qualifies for  Medicare.  We will plan to continue his current medications.  I will  plan to see him back in the first week of October 2009.  We will  schedule him to come in for  intervention following that visit.     Bruce Elvera Lennox Juanda Chance, MD, [facility]  Electronically Signed    BRB/MedQ  DD: 08/27/2007  DT: 08/28/2007  Job #: [id number]

## 2010-05-16 NOTE — Cardiovascular Report (Signed)
[name], [name]                ACCOUNT NO.:  [id number]   MEDICAL RECORD NO.:  [id number]          PATIENT TYPE:  INP   LOCATION:  2921                         FACILITY:  [facility]   PHYSICIAN:  Veverly Fells. Excell Seltzer, MD  DATE OF BIRTH:  12/27/1945   DATE OF PROCEDURE:  08/11/2007  DATE OF DISCHARGE:                            CARDIAC CATHETERIZATION   PROCEDURE:  1. Left heart catheterization.  2. Selective coronary angiography.  3. Saphenous vein graft angiography.  4. LIMA angiography.  5. PTCA and stenting of the saphenous vein graft sequence to OM1 and      OM2.  6. Angio-Seal of the right femoral artery.   [name] is a 65 year old gentleman with known CAD.  He has undergone  bypass surgery and multiple PCI procedures specifically on the right  coronary artery.  He presented with an acute coronary syndrome and ruled  in for a non-ST-elevation MI with positive troponins.  He was treated  with heparin, nitroglycerin and referred for cardiac catheterization.   Risks and indications of the procedure were reviewed with the patient.  Informed consent was obtained.  The right groin was prepped, draped, and  anesthetized with 1% lidocaine.  Using modified Seldinger technique, a 6-  French sheath was placed in the right femoral artery.  Standard 6-French  Judkins catheters were used for coronary angiography.  An angled pigtail  catheter was used to record left ventricular pressure.  A left  ventriculogram was deferred because of renal insufficiency.  The JR-4  catheter was used for vein graft angiography and LIMA angiography.   After the completion of the diagnostic procedure, I elected to intervene  on the saphenous vein graft sequence to OM1 and OM2.  The patient had a  99% stenosis in the proximal body of the graft with TIMI-2 flow beyond  that area.  There was a long 75% stenosis in the distal body of the  graft that I also planned on treating.  Angiomax was used for  anticoagulation.  The patient has been on long-term Plavix.  A 6-French  LCB guide catheter was used.  A cougar guidewire was used to wire the  vessel and was placed into the distal body of the vein graft.  The 99%  stenosis was predilated with a small balloon in order to allow passage  of a distal embolic protection device.  A 2.0 x 15-mm Voyager balloon  was used and was inflated to 8 atmospheres.  Two inflations were  performed.  There was TIMI-3 flow in the graft following balloon  dilatation.  At that point, a spider device was passed into the distal  body of the graft without difficulty.  I elected to primarily stent the  distal lesion with 3.5 x 23-mm Promus stent.  This was carefully  positioned and deployed at 12 atmospheres.  The stent appeared oversized  for the size of the graft.  Therefore, I elected not to postdilate it.  Attention was then turned to the proximal body of the vein graft.  This  area was stented with a 3.0 x 23-mm Promus stent  as well.  The stent was  carefully positioned and deployed at 12 atmospheres.  There was a  residual waste in the midportion of the stent and I elected to  postdilate it with a 3.25 x 15-mm Voyager [place] balloon.  Two inflations  were done up to 16 atmospheres on each inflation.  At the completion of  procedure, there was an excellent angiographic result with TIMI-3 flow  throughout the bypass graft.  There was a mild stenosis at the ostium of  the graft that appeared no worse than 40%-50%.  The patient tolerated  the procedure well.  There are no immediate complications.  An Angio-  Seal device was used to close the right femoral arteriotomy.   FINDINGS:  Aortic pressure 128/67 with a mean of 91, left ventricular  pressure 125/80.   Coronary angiography:  The left mainstem is moderately calcified.  There  is 50% diffuse stenosis throughout the left main.  The left main  bifurcates into the LAD, circumflex, and intermediate branch.  The  LAD  is a moderate-sized vessel that is totally occluded in the proximal  aspect.   The intermediate branch is a moderate-sized vessel with diffuse disease  and 80% stenosis in the proximal vessel.  This is unchanged from  previous studies.   Left circumflex is totally occluded in the proximal aspect.   The right coronary artery is widely patent.  The proximal vessel has  nonobstructive stenosis in the range of 30%.  This was unchanged from  previous studies.  There is a long segment of overlapping stents  throughout the midportion of the vessel.  There is 40%-50% in-stent  restenosis in the proximal portion of the stent, this is clearly  nonobstructive.  At the distal aspect of the stent just near the distal  stent edge, there is an 80% edge restenosis.  Beyond that the vessel  appears angiographically normal leading into mild nonobstructive plaque  at the bifurcation of the first posterolateral branch with the posterior  AV segment.  The PDA branch is totally occluded and fills from both left-  to-right collaterals and right-to-right collaterals via an acute  marginal branch.   LIMA angiography:  The LIMA is widely patent.  There is excellent flow  into the LAD and large diagonal branch.  There is also collateral flow  into an RV marginal branch as well as the PDA.  There are no significant  stenosis in the LIMA or the LAD.   Saphenous vein graft angiography:  Saphenous vein graft to the right  coronary artery is occluded in the proximal body of the graft.   Saphenous vein graft sequence to OM1 and OM2 has critical stenosis.  There is a 99% lesion in the proximal body of the graft.  There is a  second lesion in the distal body of the graft of 75%.  There is TIMI-2  flow in these vessels.   FINAL CONCLUSIONS:  1. Severe native three-vessel coronary artery disease as outlined.  2. Severely diseased sequential saphenous vein graft to OM1 and OM2      with successful PCI.  3.  Patent LIMA to LAD.  4. Severe right coronary artery in-stent restenosis.   DISCUSSION:  [name] should continue on aspirin and Plavix.  Would  recommend staged PCI of the distal right coronary artery in-stent  restenosis.  I suspect this is a stable lesion and can be treated  electively as the vessel has TIMI-3 flow and no high-risk angiographic  indicators.  We will discuss timing with the patient, but likely will  perform electively as an outpatient.      Veverly Fells. Excell Seltzer, MD  Electronically Signed     MDC/MEDQ  D:  08/11/2007  T:  08/12/2007  Job:  [id number]

## 2010-05-16 NOTE — Assessment & Plan Note (Signed)
[facility]                            CARDIOLOGY OFFICE NOTE   RAYFORD, WILLIAMSEN                         MRN:          [id number]  DATE:07/18/2006                            DOB:          1945/04/06    PRIMARY CARE PHYSICIAN:  None.   CLINICAL HISTORY:  Mr. Foister is 65 years old and had bypass surgery in  1993.  In April 2007, he had a non-ST elevation infarction and had  tandem overlapping drug eluting stents placed in the right coronary  artery.  He had recurrent symptoms, and in July 2007, he had a third  stent placed at the distal edge for peri-stent restenosis.  He had done  fairly well until recently when he developed symptoms of increasing  fatigue and decreasing exercise tolerance.  Two weeks ago, he had a  prolonged episode of chest pain lasting about two days associated with  palpitations.  He also complains of some easy bruising.   PAST MEDICAL HISTORY:  Significant for hyperlipidemia and degenerative  disk disease.   CURRENT MEDICATIONS:  1. Lisinopril.  2. Aspirin.  3. Lovastatin.  4. Metoprolol.  5. Clopidogrel.   PHYSICAL EXAMINATION:  VITAL SIGNS:  Blood pressure 130/82, pulse 60 and  regular.  NECK:  No venous distention.  Carotid pulses were full without bruits.  CHEST:  Clear.  HEART:  Rhythm was regular.  No murmurs, rubs or gallops.  ABDOMEN:  Soft, no organomegaly.  EXTREMITIES:  Peripheral pulses were full.  There was no peripheral  edema.   STUDIES:  ECG showed nonspecific STT changes and had not changed.   IMPRESSION:  1. Coronary artery disease status post prior bypass surgery in 1993      and multiple percutaneous interventions as described above.  2. Recurrent chest pain and decreased exercise tolerance.  3. Good LV function.  4. Hyperlipidemia.  5. Degenerative disk disease of the lumbar spine.   RECOMMENDATIONS:  Mr. Lysaght is having decreased exercise intolerance and  had an episode of prolonged chest  pain two weeks ago.  Will plan to  evaluate him with an exercise rest stress Myoview scan.  Will also get a  CBC, BMP, BNP and TSH.  Will also cut his aspirin back  from 325 to 81 a day because of easy bruising.  I will see him back in  three weeks.  I will call him with results of the test pending on  availability.     Bruce Elvera Lennox Juanda Chance, MD, [facility]  Electronically Signed    BRB/MedQ  DD: 07/18/2006  DT: 07/19/2006  Job #: [id number]

## 2010-05-16 NOTE — Cardiovascular Report (Signed)
[name], [name]                ACCOUNT NO.:  [id number]   MEDICAL RECORD NO.:  [id number]          PATIENT TYPE:  OIB   LOCATION:  6526                         FACILITY:  [facility]   PHYSICIAN:  Bruce R. Juanda Chance, MD, FACCDATE OF BIRTH:  1945-07-08   DATE OF PROCEDURE:  10/08/2007  DATE OF DISCHARGE:                            CARDIAC CATHETERIZATION   CLINICAL HISTORY:  Mr. Mcmiller is 65 years old and has had a remote bypass  surgery and multiple percutaneous coronary interventions.  He was  admitted a couple months ago with a non-ST-elevation myocardial  infarction and underwent stenting of the vein graft to the circumflex  system by Dr. Excell Seltzer.  He had no insurance, so he was sent home and we  brought him back 6 weeks later today for intervention on a distal in-  stent restenotic lesion in the distal right coronary artery.  He  initially had 2 overlapping TAXUS stents placed in the proximal and mid  vessel and then subsequently had a TAXUS placed overlapping the previous  stents and distal to the previous stents for peri-stent restenosis.  His  restenosis was at the distal edge of the third most recently placed  stent.   PROCEDURE:  The procedure was performed via the femoral artery using  sheath and 6-French JR-4 guiding catheter with side holes.  The patient  given Angiomax bolus infusion and had been loaded with clopidogrel and  had been on chronic clopidogrel therapy and was given chewable aspirin.  We passed a Prowater wire down the vessel without difficulty.  We  initially tried to cross with a 2.5 x 10 mm cutting balloon but could  not cross the lesion.  We therefore predilated it with a 2.0 x 15 mm  Apex performing one inflation at 8 atmospheres for 30 seconds.  We then  passed a 2.5 x 10 mm cutting balloon and performed a total of 7  inflations for approximately 40 seconds each.  Final diagnostics were  then performed through the guiding catheter.  The patient tolerated the  procedure well and left the laboratory in satisfactory condition.   RESULTS:  Initially, the stenosis was located in the distal right  coronary artery at the distal edge of the most recently placed TAXUS  stent which was a 2.5 x 12 mm stent.  The stenosis extended just outside  the stent and also extended just inside the previous TAXUS stent.  Following cutting balloon angioplasty, the stenosis improved from 90% to  less than 10%.   CONCLUSION:  Successful PTCA of the in-stent restenotic lesion in the  distal right coronary using a cutting balloon with improvement in the  stent narrowing from 90% to 10%.   DISPOSITION:  The patient returned to Angioplasty Unit for further  observation.      Bruce Elvera Lennox Juanda Chance, MD, [facility]  Electronically Signed     BRB/MEDQ  D:  10/08/2007  T:  10/09/2007  Job:  [id number]   cc:   Donata Duff

## 2010-05-16 NOTE — Assessment & Plan Note (Signed)
[facility]                            CARDIOLOGY OFFICE NOTE   ALASDAIR, KLEVE                         MRN:          [id number]  DATE:08/13/2006                            DOB:          19-Aug-1945    PRIMARY CARE PHYSICIAN:  None.   CLINICAL HISTORY:  Mr. 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  He had bypass  surgery in 1993, and he has had stents placed in his right coronary  artery.  A year ago he had an overlapping drug-eluting stent put in for  edge restenosis.  He was still having some symptoms of neck and arm pain  and we did an Adenosine Myoview scan on him recently and this showed no  evidence of ischemia.  He still is complaining of pain in his neck and  left shoulder and arm.  This appears to be related to position.  He also  has symptoms of nausea and difficulty swallowing.  He says food has a  hard time getting down.  He has no prior history of known GI disease.   PAST MEDICAL HISTORY:  1. Hyperlipidemia.  2. Degenerative disk disease.   CURRENT MEDICATIONS:  Include Lisinopril, Lovastatin, Metoprolol,  clopidogrel, and aspirin.   PHYSICAL EXAMINATION:  VITAL SIGNS:  Blood pressure is 150/88 and the  pulse is 65 and regular.  NECK:  There was no venous distention.  The carotid pulses were full  without bruits.  CHEST:  Clear without rales or rhonchi.  HEART:  Rhythm was regular.  The heart sounds were normal.  There were  no murmurs or gallops.  ABDOMEN:  Soft without organomegaly.  EXTREMITIES:  Peripheral pulses were full and there was no peripheral  edema.   IMPRESSION:  1. Coronary artery disease, status post prior bypass surgery and      multiple percutaneous interventions, now stable with nonischemic      Myoview scan.  2. Neck, shoulder, and arm pain and arm numbness, suspect cervical      degenerative disk disease with radicular pain.  3. Dysphagia and  nausea suspect esophageal stricture rule out other      causes.  4. Hyperlipidemia.  5. Degenerative disk disease of the lumbar spine.   RECOMMENDATIONS:  1. I talked with Dr. [name] today and we will arrange GI      consultation for his nausea and dysphagia.  2. We will also get a cervical spine x-ray to look for evidence of      degenerative disk and arthritic disease in the neck.  If this is      abnormal, then we will consider a referral.  3. I think his heart is stable and from that standpoint I will plan to      see him back in followup in a year.  4. I told him not to stop the Plavix without checking with [place] since he      has multiple drug-eluting stents in the right coronary artery.  Bruce Elvera Lennox Juanda Chance, MD, [facility]  Electronically Signed    BRB/MedQ  DD: 08/13/2006  DT: 08/14/2006  Job #: [id number]

## 2010-05-16 NOTE — Discharge Summary (Signed)
[name], [name]                ACCOUNT NO.:  [id number]   MEDICAL RECORD NO.:  [id number]          PATIENT TYPE:  INP   LOCATION:  6526                         FACILITY:  [facility]   PHYSICIAN:  Everardo Beals. Juanda Chance, MD, FACCDATE OF BIRTH:  1945-04-13   DATE OF ADMISSION:  10/08/2007  DATE OF DISCHARGE:  10/09/2007                               DISCHARGE SUMMARY   PRIMARY CARDIOLOGIST:  Everardo Beals. Juanda Chance, MD, [facility].   PRIMARY CARE PROVIDE:  Donata Duff in [place], [place].   DISCHARGE DIAGNOSIS:  Unstable angina.   SECONDARY DIAGNOSES:  1. Coronary artery disease status post coronary artery bypass graft      with successful percutaneous coronary intervention and cutting      balloon angioplasty to the right coronary artery on this admission.  2. Hypertension.  3. Hyperlipidemia.  4. Degenerative joint disease of the lumbar spine.  5. Nephrolithiasis status post lithotripsy.  6. Remote tobacco abuse.   ALLERGIES:  No known drug allergies.   PROCEDURES:  Successful cutting balloon angioplasty of the proximal  right coronary artery secondary to in-stent restenosis.   HISTORY OF PRESENT ILLNESS:  A 65 year old married Caucasian male with  prior history of CAD status post CABG who recently suffered non-ST-  segment elevation myocardial infarction in August 2009 with drug-eluting  stent placement to the sequential vein graft to the obtuse marginal and  left posterolateral.  At that time, he was also noted to have 80% in-  stent restenosis in the right coronary artery; however, the patient  deferred further procedure at that point, and he was managed medically.  Unfortunately, he continued to have exertional chest tightness and was  seen in the office on October 01, 2007.  Decision was made to pursue  PCI of the RCA.   HOSPITAL COURSE:  The patient presented to the cath lab on October 08, 2007.  He underwent successful cutting balloon angioplasty of the  proximal right coronary  artery.  He tolerated this procedure well.  Postprocedure, he has been ambulating without difficulty or recurrent  symptoms.  He is being discharged home today in good condition.   DISCHARGE LABORATORY:  Hemoglobin 13.0, hematocrit 37.6, WBC 6.1,  platelets 197, and MCV 92.5.  Sodium 133, potassium 2.9, chloride 105,  CO2 23, BUN 18, creatinine 1.33, glucose 108, and calcium 8.3.   DISPOSITION:  The patient is being discharged home today in good  condition.   FOLLOWUP PLANS AND APPOINTMENTS:  We have arranged to follow up with Dr.  Juanda Chance on October 22, 2007, at 9:15 a.m.  He is asked to follow up with  Dr. Mora Appl as previously scheduled.   DISCHARGE MEDICATIONS:  1. Aspirin 325 mg daily.  2. Plavix 75 mg daily.  3. Metoprolol tartrate 50 mg b.i.d.  4. Lisinopril 10 mg daily.  5. Lovastatin 40 mg daily.  6. Lasix 20 mg daily p.r.n.  7. Nitroglycerin 0.4 mg sublingual p.r.n. chest pain.   OUTSTANDING LABORATORY STUDIES:  None.   DURATION DISCHARGE ENCOUNTER:  40 minutes including physician time.      Nicolasa Ducking, ANP  Bruce Elvera Lennox Juanda Chance, MD, [facility]  Electronically Signed    CB/MEDQ  D:  10/09/2007  T:  10/09/2007  Job:  [id number]   cc:   Donata Duff

## 2010-05-16 NOTE — Assessment & Plan Note (Signed)
[facility]                            CARDIOLOGY OFFICE NOTE   [name], [name]                         MRN:          [id number]  DATE:10/22/2007                            DOB:          1945/05/30    PRIMARY CARE PHYSICIAN:  Dr. [name] in [place], [place].   CLINICAL HISTORY:  [name] is a 65 year old and returned for followup  management of his coronary artery disease after his recent intervention.  He had bypass surgery in 1993 and he has had 3 overlapping drug-eluting  stents in the right coronary artery, the last of which was in July 2007.  He was hospitalized in August with a non-ST elevation myocardial  infarction and had stenting of a tight lesion in the vein graft, was  subsequently followed by Dr. [name] and he had no insurance, so he was  sent home and we brought him back later after he had insurance for  intervention on the in-stent restenotic lesion in the right coronary  which we treated with cutting balloon angioplasty.   He has done quite well since discharge with no recurrent chest pain or  cardiac symptoms.   PAST MEDICAL HISTORY:  Significant for arthritic problems with  degenerative disk disease in the neck and back.  He also has  hyperlipidemia and hypertension.   CURRENT MEDICATIONS:  1. Plavix.  2. Aspirin.  3. Lovastatin 40 mg daily.  4. Lisinopril 10 mg b.i.d.  5. Metoprolol 50 mg daily.   PHYSICAL EXAMINATION:  VITAL SIGNS:  On examination today, the blood  pressure was 146/76 and pulse 58 and regular.  There was no venous  distention.  The carotid pulses were full without bruits.  CHEST:  Clear.  CARDIAC:  Rhythm was regular.  No murmurs or gallops.  ABDOMEN:  Soft without organomegaly.  Left femoral artery site had a  small knot, but was well healed.  Peripheral pulses were full and has no  peripheral edema.   An EKG showed an old inferior infarction, nonischemic ST-T changes.   IMPRESSION:  1. Coronary artery disease, status post coronary bypass graft in 1993      and status post multiple percutaneous interventions, the last of      which was with recent cutting balloon angioplasty for in-stent      restenosis in the right coronary artery.  2. Good left ventricular function.  3. Hyperlipidemia.  4. Hypertension.  5. Degenerative joint disease of the lumbar and cervical spine.   RECOMMENDATIONS:  [name] is doing well from a cardiac standpoint.  I  think a simvastatin would be better for him than lovastatin and we will  make that change when he finish his current supply of lovastatin and set  him on simvastatin 40 with Dr. Mora Rice followup on that with laboratory  studies.   PLAN:  See him back in follow up in 6 months.     Douglas Elvera Lennox Juanda Chance, MD, [facility]  Electronically Signed    BRB/MedQ  DD: 10/22/2007  DT: 10/23/2007  Job #: [id number]   cc:   Douglas Needle  [name]

## 2010-05-16 NOTE — Assessment & Plan Note (Signed)
[facility]                         GASTROENTEROLOGY OFFICE NOTE   CISCO, KINDT                         MRN:          [id number]  DATE:08/21/2006                            DOB:          Jul 16, 1945    [name] is a very nice 65 year old gentleman with solid food  dysphagia.  He is followed by Dr. Charlies Constable for coronary artery  disease status post coronary artery bypass graft in 1993 with placement  of stents in the right coronary artery one year ago and overlapping drug-  eluting stent was put in for edge restenosis.  Last coronary angiogram  was done in July, 2007.  His solid food dysphagia has been present now  for several years but has become gradually more severe.  He denies any  gastroesophageal reflux, regurgitation of food or any food impaction.  He denies hoarseness or nocturnal cough.   MEDICATIONS:  1. Lisinopril 5 mg p.o. daily.  2. Lovastatin 80 mg p.o. daily.  3. Metoprolol 50 mg p.o. b.i.d.  4. Clopidogrel 75 mg p.o. daily.  5. Aspirin 81 mg p.o. daily.   PAST HISTORY:  Significant for coronary artery disease as mentioned in  History of Present Illness.   FAMILY HISTORY:  1. Heart disease in mother.  2. Lung cancer in brother and sister.  3. Kidney cancer in mother.   SOCIAL HISTORY:  1. He is married with 2 children.  2. Patient does not smoke.  3. He does not drink alcohol.   REVIEW OF SYSTEMS:  Positive for back pain and numbness in his feet.   PHYSICAL EXAM:  Blood pressure 124/78, pulse 78 and weight 167 pounds.  He was alert, oriented, in no distress.  LUNGS:  Clear to auscultation.  COR:  With quiet precordium, normal S1, normal S2.  ABDOMEN:  Soft, nontender.  I could not precipitate any abdominal pain.  EXTREMITIES:  Trace edema.  He had some ecchymosis on his anterior shins  and on his arms.   IMPRESSION:  A 65 year old white male with progressive solid food  dysphagia consistent with benign esophageal  stricture.  He denies any  dysphagia to liquids or any odynophagia.  There is no history of  gastroesophageal reflux disease.  His stricture may be due to Schatzki's  rings or possibly due to muscular ring or web.  His anticoagulated with  platelets and aspirin.  I have discussed his anticoagulation with Dr.  Charlies Constable who feels that patient ought to stay on his anticoagulants  to protect his drug-eluting stents.   PLAN:  Upper endoscopy with very careful esophageal dilation while  patient off of Plavix and aspirin.  At the time of endoscopy we will  also assess for possibility of Barrett's esophagus.  Patient will  continue all his other medications.  This was discussed with the patient  and he agrees to proceed.     Douglas Morton. Juanda Chance, MD  Electronically Signed    DMB/MedQ  DD: 08/21/2006  DT: 08/22/2006  Job #: [id number]   cc:   Everardo Beals. Juanda Chance, MD, [facility]

## 2010-05-16 NOTE — Assessment & Plan Note (Signed)
[facility]                         GASTROENTEROLOGY OFFICE NOTE   [name], [name]                         MRN:          [id number]  DATE:08/21/2006                            DOB:          [birth year]    GI CONSULTATION   INCOMPLETE DICTATION     Hedwig Morton. Juanda Chance, MD  Electronically Signed    DMB/MedQ  DD: 08/21/2006  DT: 08/22/2006  Job #: [id number]

## 2010-05-16 NOTE — Discharge Summary (Signed)
[name], [name]                ACCOUNT NO.:  [id number]   MEDICAL RECORD NO.:  [id number]          PATIENT TYPE:  INP   LOCATION:  2504                         FACILITY:  [facility]   PHYSICIAN:  Everardo Beals. Juanda Chance, MD, FACCDATE OF BIRTH:  1945/08/01   DATE OF ADMISSION:  03/09/2008  DATE OF DISCHARGE:  03/10/2008                               DISCHARGE SUMMARY   PRIMARY CARDIOLOGIST:  Everardo Beals. Juanda Chance, M.D.   PRIMARY CARE PHYSICIAN:  Donata Duff, M.D., [place], [place].   DISCHARGE DIAGNOSIS:  Unstable angina.   SECONDARY DIAGNOSES:  1. Coronary artery disease, status post prior coronary artery bypass      grafting.  2. Hypertension.  3. Hyperlipidemia.  4. Degenerative disc disease involving the neck and back.   ALLERGIES:  No known drug allergies.   PROCEDURES:  Left heart cardiac catheterization revealing native  multivessel disease with 2 of 3 patent grafts.  Previously placed stents  in the mid and distal right coronary artery have 80% stenoses and  underwent successful percutaneous coronary intervention with Cypher drug-  eluting stent x2.   HISTORY OF PRESENT ILLNESS:  This is a 65 year old male with prior  history of CAD who recently saw Dr. Juanda Chance in the clinic on March 05, 2008 with complaints of recurrent angina.  He was scheduled for a  cardiac catheterization.   HOSPITAL COURSE:  The patient presented to the catheterization lab on  March 09, 2008 and underwent left heart cardiac catheterization revealing  significant native multivessel disease with 2 of 3 patent grafts. He had  previously undergone stenting of the mid and distal right coronary  artery and was noted to have 80% in-stent restenosis within both sites.  He then underwent successful PCI and stenting of the distal stenosis and  placement of 2.5 x 18 mm Cypher drug-eluting stent. The mid stenosis was  treated with 2.75 x 33 mm Cypher drug-eluting stent.  The patient  tolerated the procedure well.   Post-procedure the patient has been  ambulating without recurrent symptoms or limitations.  He is being  discharged home today in good condition.   LABORATORY DATA:  Discharge labs include hemoglobin 11.2, hematocrit  32.3, white blood cells 5.9, platelet count 182,000.  Sodium 140,  potassium 4.7, chloride 107, CO2 28, BUN 9, creatinine 1.47.  Glucose  123.  Calcium 8.8.   DISPOSITION:  The patient will be discharged home today in good  condition.   FOLLOWUP PLANS AND APPOINTMENTS:  We will obtain a BMET in 1 week  secondary to mildly elevated creatinine of 1.47.  Patient is asked to  follow up with Dr. Juanda Chance on March 26, 2008 at 10:45 A.M.  Patient is to  follow up with Dr. Mora Appl as previously scheduled.   DISCHARGE MEDICATIONS:  1. Aspirin 325 mg p.o. daily.  2. Plavix 75 mg daily.  3. Lopressor 25 mg b.i.d.  4. Lisinopril 10 mg b.i.d.  5. Lovastatin 40 mg nightly.  6. TRA2P study drug daily.  7. Nitroglycerin 0.4 mg sublingual p.r.n. chest pain.   OUTSTANDING LABORATORY STUDIES:  None.  Duration of discharge encounter was 32 minutes including physician time.      Nicolasa Ducking, ANP      Bruce R. Juanda Chance, MD, [facility]  Electronically Signed    CB/MEDQ  D:  03/10/2008  T:  03/10/2008  Job:  [id number]   cc:   Donata Duff

## 2010-05-19 ENCOUNTER — Other Ambulatory Visit: Payer: Self-pay | Admitting: *Deleted

## 2010-05-19 MED ORDER — LISINOPRIL 20 MG PO TABS: 20.0000 mg | ORAL_TABLET | Freq: Every day | ORAL | Status: DC

## 2010-05-19 NOTE — Cardiovascular Report (Signed)
NAMEBARDIA, WANGERIN NO.:  [id number]   MEDICAL RECORD NO.:  [id number]          PATIENT TYPE:  INP   LOCATION:  6529                         FACILITY:  [facility]   PHYSICIAN:  Charlies Constable, M.D. [facility] DATE OF BIRTH:  May 30, 1945   DATE OF PROCEDURE:  07/19/2005  DATE OF DISCHARGE:                              CARDIAC CATHETERIZATION   CLINICAL HISTORY:  Mr. Gavigan is 65 years old and had bypass surgery in 1993.  In April of this year, he had a non-ST-elevation infarction and underwent  placement of tandem overlapping stents in the proximal to midright coronary  artery.  He also had disease in the vein graft and we elected to treat the  native vessel.  His LIMA graft to LAD and his vein graft to the marginal of  the posterolateral branch of the circumflex artery were patent.  He  developed recurrent symptoms of neck and arm pain and had a borderline  abnormal Cardiolite scan showing mild inferior ischemia.  He was brought  back today and for catheterization in the outpatient laboratory and was  found to have a 90% restenosis at the distal edge of the stent.  He was  brought upstairs for intervention.   PROCEDURE:  The procedure was performed by the femoral using arterial sheath  and a 6-French JR-4 guiding catheter with side holes.  The patient was given  Angiomax bolus and infusion.  He was given additional Plavix 300 mg a couple  of hours before the procedure.  We navigated a Prowater wire across the two  stents and across the lesion without much difficulty.  We direct stented  with a 2.5 x 12 mm Taxus stent deploying this with one inflation of 11  atmospheres for 30 seconds.  We postdilated with 2.75 x 15 mm Quantum  Maverick performing one inflation up to 15 atmospheres for 30 seconds.   Final diagnostic was then performed with the guiding catheter.  The patient  tolerated the procedure well and left the laboratory in satisfactory  condition.   RESULTS:  Initially  the stenosis was located at the distal edge of the stent  in the mid-to-distal vessel and was estimated at 90%.  Following stenting,  this improved to 0%.   CONCLUSION:  Successful percutaneous coronary intervention of the peristent  restenotic lesion using a Taxus drug-eluting stent with improvement in  percent narrowing from 90% to 0%.   DISPOSITION:  The patient returned to __________ for further observation.           ______________________________  Charlies Constable, M.D. [facility]     BB/MEDQ  D:  07/19/2005  T:  07/19/2005  Job:  [id number]   cc:   Charlies Constable, M.D. [facility]  [address]  [address]  [place]  [place] [address]   Cardiopulmonary Lab

## 2010-05-19 NOTE — H&P (Signed)
[name], [name] NO.:  [id number]   MEDICAL RECORD NO.:  [id number]          PATIENT TYPE:  INP   LOCATION:  4703                         FACILITY:  [facility]   PHYSICIAN:  Rod Holler, MD      DATE OF BIRTH:  03/17/1945   DATE OF ADMISSION:  04/16/2005  DATE OF DISCHARGE:                                HISTORY & PHYSICAL   CHIEF COMPLAINT:  Chest pain.   HISTORY OF PRESENT ILLNESS:  [name] is a 65 year old male with known  coronary artery disease, status post MI, status post CABG in 1993 with  saphenous vein graft to acute marginal, RCA, saphenous vein graft to OM1 and  OM2, LIMA to LAD and second diagonal who presented to an outside hospital  with complaints of chest pain.  Of note, the patient yesterday had some  chest tightness which resolved on its own today.  Today, while working, the  patient had tightness across his chest, associated shortness of breath,  weakness, nausea, and diaphoresis.  The discomfort lasted for about an hour,  and resolved with sublingual nitroglycerin at an outside hospital.  Currently, the patient has no complaints of chest discomfort, no shortness  of breath.  He has had no recent chest discomfort with exertion.  He has no  complaints of PND or orthopnea, no lower extremity swelling, no complaints  of palpitations.  He also has no syncope, no presyncope.  At the outside  hospital, the patient was given aspirin, Lopressor, sublingual nitroglycerin  and nitroglycerin paste.   PAST MEDICAL HISTORY:  1.  Coronary artery disease, status post inferior myocardial infarction,      status post CABG in 1993, last cardiac catheterization in February of      2001, showed 50% distal left main disease, 80% proximal LAD, 50% ostial      first diagonal, 78-80% mid second diagonal, 100% proximal left      circumflex, right dominant system with 50% ostial RCA, mid RCA severely      diseased, saphenous vein graft to acute marginal and RCA  patent,      saphenous vein graft to OM1 and OM2 patent, LIMA to LAD and second      diagonal patent.  2.  Hyperlipidemia.  3.  Degenerative disk disease.   MEDICATIONS:  1.  Aspirin.  2.  Lovastatin.   ALLERGIES:  No known drug allergies.   SOCIAL HISTORY:  The patient lives in [place], is a former smoker,  and works in [employer].  Strong family history of coronary  artery disease in his mother and his brother.   REVIEW OF SYSTEMS:  All systems reviewed in detail are negative except as  noted in the history of present illness.   PHYSICAL EXAMINATION:  VITAL SIGNS:  Temperature 98.9, blood pressure  146/72, heart rate 73, respiratory rate 20, oxygen saturation 96% on room  air, weight 180.9 pounds.  GENERAL:  A well-developed, well-nourished, male, alert and oriented x3, in  no acute distress.  HEENT:  Normocephalic and atraumatic.  Pupils equal, round, and reactive  to  light, extraocular movements intact, oropharynx clear.  NECK:  Supple, no adenopathy. No JVD and no carotid bruits.  CHEST:  Lungs clear to auscultation bilaterally with equal bilateral breath  sounds.  HEART:  Regular rhythm, normal rate, normal S1 and S2, no murmurs, rubs, or  gallops, 1+ dorsalis pedis pulses bilaterally, 2+ right femoral pulse.  ABDOMEN:  Soft, nontender, and nondistended.  Active bowel sounds.  No  hepatosplenomegaly.  EXTREMITIES:  No cyanosis, clubbing, or edema.  NEUROLOGY:  No focal deficits.  SKIN:  No rashes.  PSYCHIATRIC:  Normal affect.   EKG at outside hospital showed normal sinus rhythm, possible old inferior  infarct, no ST or T wave changes.   LABORATORY DATA:  None available.   IMPRESSION:  [name] is a 65 year old male with known coronary artery  disease, who presents with complaints concerning for unstable angina.   PLAN:  1.  Cardiovascular.  We will get serial cardiac enzymes to rule out for      myocardial infarction.  We will place the  patient on an aspirin daily,      Lipitor, Lopressor, and Lisinopril.  We will start him on a heparin      drip.  We will get a lipid panel in the morning.  We will get an EKG.      We will place the patient NPO after midnight.  2.  Pulmonary.  We will get a PA and lateral chest x-ray.  3.  Fluids, electrolytes, and nutrition.  Place the patient on a cardiac      diet, NPO after midnight.  4.  Renal.  We will get a comprehensive metabolic panel.  5.  Hematologic.  We will get a CBC, coagulation studies, type and screen.      We will hemoccult all stools on the heparin drip.           ______________________________  Rod Holler, MD     TRK/MEDQ  D:  04/16/2005  T:  04/16/2005  Job:  [id number]

## 2010-05-19 NOTE — Assessment & Plan Note (Signed)
[facility]                              CARDIOLOGY OFFICE NOTE   Douglas, Rice                         MRN:          [id number]  DATE:07/18/2005                            DOB:          [birth year]    PRIMARY CARE PHYSICIAN:  None.   CLINICAL HISTORY:  [name] is 65 years old and had bypass surgery in 1993.  In April of this year, he was admitted with a non-ST-elevation infarction  and underwent intervention on the native right coronary artery with  placement of tandem overlapping drug-eluting stents.  We elected to treat  the native vessel rather than the grafts, since this was more favorable.  At  that time, he was found to have a patent vein graft to the marginal and  posterolateral branch of the circumflex artery and patent LIMA to the LAD  and good LV function with ejection fraction of 60%.   Recently, he has had symptoms of left arm and back and neck pain which have  not been totally related to exertion.  He also has had easy fatigability.  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  We did a  Cardiolite scan on him and it showed borderline inferior ischemia and I saw  him with [name] a few weeks ago and we elected to follow him medically.  However, he has continued to have persistent symptoms that have interviewed  with his work.   PAST MEDICAL HISTORY:  Significant for hyperlipidemia and degenerative disk  disease.   CURRENT MEDICATIONS:  His current medications include lisinopril, aspirin,  lovastatin, metoprolol and clopidogrel.   PHYSICAL EXAMINATION:  VITAL SIGNS:  On examination today, the blood  pressure was 115/76 and the pulse 52 and regular.  NECK:  There was no venous distention.  The carotid pulses were full without  bruits.  CHEST:  Clear.  CARDIAC:  Rhythm was regular.  I could hear no murmurs or gallops.  ABDOMEN:  Soft without organomegaly.  EXTREMITIES:  Peripheral pulses were full and there was  no peripheral edema.   IMPRESSION:  1.  Arm and neck pain, which may be an anginal equivalent.  2.  Coronary artery disease, status post coronary artery bypass graft      surgery in 1993 and status post percutaneous coronary intervention with      tandem overlapping drug-eluting stents to the right coronary artery,      April 2007.  3.  Good left ventricular function.  4.  History of volume overload.  5.  Hyperlipidemia.  6.  Degenerative disk disease of the lumbar spine.   RECOMMENDATIONS:  [name] is still having disabling symptoms.  I am not  certain if these are related to his heart with his borderline scan and  persistent symptoms, but I think he should be evaluated further with  angiography.  We will plan to arrange for him to come in the hospital  tomorrow for an outpatient right and left heart catheterization and graft  study.  If it turns out that he does not have  significant source of  ischemia, then we will consider evaluation for a possible cervical spine  disease.  We will also do a right heart catheterization, since he has  history of fluid retention, to assess whether some of his symptoms might be  related to diastolic dysfunction.                               Bruce Elvera Lennox Juanda Chance, MD, [facility]    BRB/MedQ  DD:  07/18/2005  DT:  07/18/2005  Job #:  [id number]

## 2010-05-19 NOTE — Cardiovascular Report (Signed)
[place]  Patient:    [name], [name]                         MRN: [id number] Proc. Date: 03/02/99 Adm. Date:  [id number] Attending:  Colon Branch CC:         Veneda Melter, M.D. [facility]             Donata Duff, M.D., [place], [place]             Everardo Beals. Juanda Chance, M.D. [facility]                        Cardiac Catheterization  PROCEDURES PERFORMED: 1. Left heart catheterization. 2. Left ventriculogram. 3. Angiogram of saphenous vein bypass graft and LIMA graft.  DIAGNOSES: 1. Native three-vessel coronary artery disease. 2. Patent LIMA and saphenous vein grafts. 3. Normal left ventricular systolic function.  HISTORY:  [name] is a 65 year old white male who has history of coronary artery disease, and has suffered an inferior wall myocardial infarction in the past. e has undergone coronary artery bypass graft surgery in 1993, with placement of the LIMA to the LAD and second diagonal branch; saphenous vein graft to OM1 and OM2 of the left circumflex; and saphenous vein graft to the acute marginal and distal CA. The patient had done well; however, recently he had developed some substernal chest discomfort.  He was admitted to the hospital.  He subsequently ruled out or acute myocardial infarction and presents now for left heart catheterization.  TECHNIQUE:  After informed consent was obtained, the patient was brought to the  catheterization lab and both groins were sterilely prepped and draped. Lidocaine 1% was used to infiltrate the right groin.  A 6-French sheath was placed in the  right femoral artery using the modified Seldinger technique.  Six-French JL4 and JR4 catheters were then used to engage the left and right coronary arteries. Selective angiography was performed in various projections using manual injections of contrast.  A JR4 catheter was then used to engage the saphenous vein graft, nd an IMA catheter was introduced and used to  engage the LIMA graft to the LAD.  A  6-French pigtail catheter was advanced to the left ventricle.  A left ventriculogram was performed using power injections of contrast.  At the termination of the case catheters and sheath were removed.  Manual pressure was applied and adequate hemostasis was achieved.  The patient tolerated the procedure well and was transferred to the Floor in stable condition.  INITIAL FINDINGS: 1. Left Main Trunk:  Distal eccentric plaque of at least 50%. 2. Left Anterior Descending:  This is a medium-caliber vessel that provides two    diagonal branches in the proximal segment.  The proximal LAD is severely and    diffusely diseased, with narrowings of 80% extending to the first diagonal    branch.  The mid and distal LAD has mild disease and is seen to fill via a LIMA    graft.  The first diagonal branch has an ostial narrowing of 50%.  This is a    small-caliber vessel with extensive distribution down the anterolateral wall.    The second diagonal branch is a small-caliber that bifurcates in its mid    section.  There is a narrowing of 70-80% in its mid section prior to the    bifurcation. 3. Left Circumflex Artery:  A medium-caliber vessel that provides  several small  marginal branches.  The native left circumflex artery is occluded after    providing a trivial first marginal branch, which arises near the origin of the    left circumflex artery.  An acute marginal branch fills via a saphenous vein  graft and continues on to the distal RCA.  This vessel has mild disease of    40-50% in its proximal segment.  Two distal marginal branches are seen to fill    via sequential saphenous vein graft, and have mild, diffuse disease. 4. Right Coronary Artery:  Dominant.  This is a medium-caliber vessel that    provides a small posterior descending artery and a small posterior ventricular    branch in its terminal segment.  The ostium of the right coronary artery  has a    narrowing of at least 50-60%.  Damping of wave form is noted with engagement    with a 6-French JR4 catheter.  The mid section of the RCA is severely and    diffusely diseased, with narrowings of 70-80%.  The distal RCA fills    predominately via a saphenous vein graft.  There is a high-grade narrowing of    60-70% in the small posterior ventricular branch. 5. Grafts:    a. The vein graft to the acute marginal with continuation of the distal RCA s       patent, with mild irregularities.    b. The vein graft to OM1 with continuation to OM2 is patent, with mild       irregularities.    c. LIMA to LAD/D2 is patent. 6. Left Ventricle:  Normal end-systolic and end-diastolic dimensions.  Overall    left ventricular function is well preserved.  Ejection fraction greater than    55%.  No mitral regurgitation.  HEMODYNAMICS: 1. Left ventricular pressure:  106/10. 2. Aortic pressure:  106/60. 3. Left ventricular end-diastolic pressure:  20.  ASSESSMENT/PLAN:  [name] is a 65 year old gentleman with small-vessel coronary artery disease.  He has widely patent bypass grafts and normal left ventricular  function.  At this point further medical management will be pursued. DD:  03/02/99 TD:  03/03/99 Job: [id number]

## 2010-05-19 NOTE — Discharge Summary (Signed)
NAMEMAKSYM, PFIFFNER                ACCOUNT NO.:  [id number]   MEDICAL RECORD NO.:  [id number]          PATIENT TYPE:  INP   LOCATION:  6533                         FACILITY:  [facility]   PHYSICIAN:  Charlies Constable, M.D. [facility] DATE OF BIRTH:  March 22, 1945   DATE OF ADMISSION:  04/16/2005  DATE OF DISCHARGE:  04/20/2005                                 DISCHARGE SUMMARY   PROCEDURE:  1.  Cardiac catheterization.  2.  Coronary arteriogram.  3.  Left ventriculogram.  4.  Graft angiogram.  5.  LIMA arteriogram.  6.  PTCA and drug-eluting stent x2 to the RCA.   TIME AT DISCHARGE:  Less than 30 minutes.   PRIMARY DIAGNOSIS:  Unstable anginal pain with periprocedural non ST  elevation myocardial infarction.   SECONDARY DIAGNOSES:  1.  Status post aortocoronary bypass surgery in 1993 with saphenous vein      graft to acute marginal and right coronary artery. Saphenous vein graft      to first obtuse marginal, second obtuse marginal and saphenous vein      graft to left anterior descending.  2.  Preserved left ventricular function with an ejection fraction of 60% at      cath.  3.  Hyperlipidemia.  4.  Degenerative disk disease.  5.  Remote history of tobacco use.  6.  Family history of coronary artery disease.   ALLERGIES/INTOLERANCES:  None.   HOSPITAL COURSE:  Mr. Rauf is a 65 year old male with a history of coronary  artery disease. He had chest pain that resolved on its own prior to  admission but on the day of admission he had chest tightness, shortness of  breath, weakness and nausea. He went to [facility] and was transferred to [facility] for further  evaluation and treatment.   His initial cardiac markers were negative except for a minimally elevated  troponin of 0.5. It was felt that he needed cardiac catheterization to  further define his anatomy and this was performed on April 18, 2005. He was  enrolled in the Sepia trial.   The cardiac catheterization showed a significantly disease SVG to acute  marginal and RCA with TIMI 1 flow. The acute marginal itself had a 95%  stenosis after the anastomosis. The native RCA had a 90 and a 95% stenosis.  The LIMA to LAD was okay and the SVG to OM 1 and 2 was patent as well. Dr.  Juanda Chance felt that the best option was stenting of the native RCA and this was  performed with two overlapping Taxus stents reducing the stenosis to zero.   Mr. Symanski tolerated the procedure well and had no chest pain post procedure.  However, his cardiac enzymes the next day were elevated with a CK-MB of  118/8.7 and a troponin of 0.4. They were rechecked and his MB increased to  23.7 with a troponin of 1.73. It was felt that he had had a periprocedural  non ST segment elevation that night and he was held over for 24 hours and  seen by cardiac  and rehab.   Mr. Castiglia had no further episodes of chest pain and his EKG had no acute  changes as well. He had Plavix added to his medication regimen as well as  sublingual nitroglycerin. Compliance with Plavix is important. He is to  continue the Lipitor and aspirin he was taking prior to admission. He was  evaluated by Dr. Juanda Chance on April 20, 2005 and considered stable for  discharge with outpatient followup arranged.   DISCHARGE INSTRUCTIONS:  His activity level is to be per the cardiac cath  discharge sheet. He is not to drive for 3 days and not to lift for 2 weeks.  He is to increase his activity slowly per rehab guidelines. He is to stick  to a low fat diet. He is to followup with Dr. Regino Schultze physician extender on  May 7 at 9 a.m. He is to followup with primary care physician as needed.   DISCHARGE MEDICATIONS:  1.  Plavix 75 mg a day.  2.  Coated aspirin 325 mg a day.  3.  Metoprolol 50 mg b.i.d.  4.  Lisinopril 5 mg daily.  5.  Nitroglycerin sublingual p.r.n.      Theodore Demark, P.A. [facility]    ______________________________  Charlies Constable,  M.D. [facility]    RB/MEDQ  D:  04/20/2005  T:  04/21/2005  Job:  [id number]

## 2010-05-19 NOTE — Cardiovascular Report (Signed)
NAMEKEYTON, BHAT NO.:  [id number]   MEDICAL RECORD NO.:  [id number]          PATIENT TYPE:  INP   LOCATION:  2807                         FACILITY:  [facility]   PHYSICIAN:  Charlies Constable, M.D. [facility] DATE OF BIRTH:  1945/09/28   DATE OF PROCEDURE:  04/18/2005  DATE OF DISCHARGE:                              CARDIAC CATHETERIZATION   CLINICAL HISTORY:  [name] is 65 years old and had bypass surgery in 1993.  He was admitted with chest pain and slightly positive troponins.  He was on  the SEPIA trial and scheduled for angiography today.   PROCEDURE:  The procedure was performed by the right femoral artery using an  arterial sheath and 6-French preformed coronary catheters.  A femoral wall  arterial punch was performed and Omnipaque contrast was used.  A JR-4  catheter was used for injection of both vein grafts and the LIMA graft.  After completion of diagnostic study we made a decision to proceed with  intervention on the native right coronary artery.  Both the graft and the  native vessel were occluded and we thought treating the native was the best  option.   The patient was enrolled in the SEPIA trial and was blinded to either SEPIA  study drug or heparin and Integrilin.  He was given additional heparin to  prolong the ACT to greater than 200 seconds if he was on heparin, and this  was blinded.  We used a 6-French JR-4 guiding catheter with side holes and a  Prowater wire.  We crossed the long the lesion in the proximal and mid right  coronary artery with the wire without difficulty.  We predilated with a 2.25  x 20 mm Maverick performing four inflations up to 18 atmospheres for 30  seconds.  We did not get full expansion in a proximal lesion so we went in  with a 2.25 x 20 Quantum Maverick, performed one inflation up to 20  atmosphere for 30 seconds which fully expanded the lesion.  We thought it  would take two long stents to cover both lesions and we  initially went in  with a 2.5 x 28 mm Cypher stent.  We were unable to pass this down the  vessel.  We then added a buddy wire using a second Prowater wire and still  were unable to pass the Cypher stent down to cover the lesion.  We then  exchanged for a 2.5 x 24 mm Taxus and with some difficulty were able to  position this in the mid to distal vessel.  We employed this with one  inflation of 12 atmospheres for 30 seconds and a second inflation of 15  atmospheres for 30 seconds with the balloon pulled inside the distal edge.  We then deployed a second 2.5 x 24 mm Taxus stent overlapping the first  stent and covering the mid and proximal lesions.  We deployed this with one  inflation of 14 atmospheres for 30 seconds.  We then attempted to pass a  2.75 x 20 mm Quantum Maverick to post dilate but were  unable to advance this  into the stents.  We used a buddy wire and still were unable to advance this  into the stents.  We then switched out for a 2.75 x 18 mm POWERSAIL and with  this balloon we were able to pass it into the stents.  We performed three  inflations up to 20 atmospheres for 30 seconds.  Final diagnostic studies  were then performed through the guiding catheter.  The patient tolerated the  procedure well and left the laboratory in satisfactory condition.   RESULTS:  The left main coronary artery was free of significant disease.   The left anterior descending coronary artery gave rise to two diagonal  branches and a septal perforator and then had competing flow distally.  There was 80% stenosis in the proximal vessel before the second diagonal  branch.   The circumflex artery gave rise to a small ramus branch and then was totally  occluded.   The native right coronary artery was patent but had a long area of 90-95%  stenosis extending from the proximal vessel into the mid to distal vessel.  This vessel filled a posterior descending and two small posterolateral  branches.    The saphenous vein graft to the distal right coronary artery had a 90%, 95%,  and 80% stenosis with TIMI 1 flow distally.  The vein connected to a  marginal branch before these blockages but there was a 95% stenosis at the  proximal portion of this marginal branch.  This was a small vessel.   The saphenous vein graft to the marginal and posterolateral branch of the  circumflex artery was patent and functioned normally.   The LIMA graft to the LAD was patent and functioned normally and there was a  third diagonal branch that filled just after the anastomosis.   The left ventriculogram performed in the RAO projection showed good wall  motion with no areas of hypokinesis.  The estimated ejection fraction was  60%.   Following placement of tandem overlapping drug-eluting stents in the  proximal and mid right coronary artery, the stenoses improved from 95% to  0%.   The aortic pressure was 127/66 with a mean of 90 and left ventricular  pressure was 127/20.   CONCLUSION:  1.  Coronary artery disease status post prior coronary artery bypass graft      surgery in 1993.  2.  Severe native vessel disease with 80% stenosis in the proximal left      anterior descending coronary artery, total occlusion of the circumflex      artery, and a long segmental 95% stenosis in the proximal mid right      coronary artery.  3.  Patent sequential vein graft to the marginal and posterolateral branch      of the circumflex artery, patent left internal mammary artery graft to      the left anterior descending coronary artery, and 95% stenosis in the      vein graft to the distal right coronary artery with TIMI 1 flow and      diffuse disease.  4.  Normal left ventricular function with an estimated ejection fraction of      60%.  5.  Successful percutaneous coronary intervention of the lesion in the      proximal and mid right coronary artery with tandem overlapping drug-     eluting stents, with  improvement in percent of narrowing from 95% to 0%.   RECOMMENDATIONS:  The  patient tolerated the procedure well even though it  was a long and difficult procedure.  We will plan discharge tomorrow if the  patient remains stable.  The right femoral artery was closed with Angio-Seal  at the end of the procedure.           ______________________________  Charlies Constable, M.D. [facility]     BB/MEDQ  D:  04/18/2005  T:  04/19/2005  Job:  [id number]   cc:   Cardiopulmonary Lab

## 2010-05-19 NOTE — Discharge Summary (Signed)
[place]. [facility]  Patient:    [name], [name]                         MRN: [id number] Adm. Date:  [id number] Disc. Date: [id number] Attending:  Colon Branch Dictator:   Leonides Cave, P.A. CCEverardo Beals. Juanda Chance, M.D. [facility]             Dr. Donata Duff in [place], [place].                  Referring Physician Discharge [place]  DATE OF BIRTH:  10/10/1945  ADMITTING PHYSICIAN:  Noralyn Pick. Eden Emms, M.D.  DISCHARGE DIAGNOSES: 1. Coronary artery disease.  (Patient with known severe three-vessel coronary    artery disease, is status post coronary artery bypass graft surgery, and had    patent grafts on cardiac catheterization on March 02, 1999, but did have small    vessel coronary artery disease.  Medical management chosen course of action    for treatment of coronary artery disease.) 2. Hyperlipidemia. 3. PROVE IT study member.  BRIEF HISTORY:  This is a very pleasant 65 year old white male who has a known history of coronary artery disease, had coronary artery bypass graft surgery in  1993.  Had a LIMA to the LAD and diagonal, SVG to OM-1 and OM-2 sequentially, and SVG to AM and PD sequentially.  Before his CABG, he had an angioplasty in 1989,  after suffering a mild heart attack.  He presented to [facility] on March 01, 1999 with progressive substernal chest pain x 7 days.  Patient states his worst episode was the evening before admission.  Patient states there is associated shortness of breath and radiation to the left upper extremity. Patient went to [facility] on the same day of admission for evaluation, and initial EKG and cardiac enzymes were within normal limits there.  He was transferred to [facility] for further evaluation.  He was given aspirin, Lopressor, and Lovenox at [facility].  He was essentially pain-free at the time of his exam by the [facility] Cardiology team.  HOSPITAL COURSE:  The  patient was taken to the catheterization laboratory by Dr. Veneda Melter on March 02, 1999.  The results are left main 50% distal; LAD 80% proximal lesion and 50% ostial of the first diagonal; 70-80% mid second diagonal. Left circumflex was occluded after a trivial OM, OM-1 and OM-2 via the SVG; mild AM with mild disease.  The AM via the SVG with a 50% proximal lesion.  The RCA was  dominant, 60% ostial lesion, long 70-80% mid lesion, and a 70% PD branch lesion. SVG to AM to RCA patent, SVG to OM to OM-2 patent, and LIMA to LAD and D-2 was also patent.  Ejection fraction was greater than 55% and there was no mitral regurgitation seen.  The assessment was patient with a small-vessel CAD, patent SVG and LIMA grafts, medical management for coronary artery disease.  Patient continued to have chest pain the following day, but it was clear at that time his pain was musculoskeletal.  His pectoralis major muscles were exquisitely tender to palpation, but there was some radiation to the back, which was sort of vague, and somewhat worrisome for angina versus musculoskeletal symptoms.  The research team saw the patient and randomized him into the PROVE IT TIMI 22 trial, and he was qualified for this  trial.  This is an excellent trial for this gentleman, as he has known hyperlipidemia but said that he has not been taking is prescribed Lipitor for financial reasons.  His total cholesterol in the hospital was 196 and LDL of 147.  The patient was subsequently discharged home on the afternoon of March 03, 1999 on the following medications.  DISCHARGE MEDICATIONS: 1. Coated aspirin q.d. 2. Toprol XL 25 q.d. 3. PROVE IT study drug, which will either be high-dose Lipitor or high-dose    Pravachol. 4. He was also given a prescription for Voltaren 75 mg b.i.d. for his    musculoskeletal chest pain.  ACTIVITY:  He is instructed to undergo no heavy lifting, driving, or sexual activity for 48 hours  post catheterization.  DIET:  He is told to adhere to a low fat, low cholesterol diet.  INSTRUCTIONS:  He is told that if bleeding or swelling occurred at the catheterization site he should call the [facility] Cardiology office immediately,  number was provided.  FOLLOW-UP:  He will follow-up with Dr. Raylene Miyamoto physician assistant on March 15, which is a Thursday, at 10:30 a.m. and he will have follow-up with the research team about the PROVE IT study. DD:  03/03/99 TD:  03/03/99 Job: [id number]

## 2010-05-19 NOTE — Discharge Summary (Signed)
NAMEBRITAIN, SABER                ACCOUNT NO.:  [id number]   MEDICAL RECORD NO.:  [id number]          PATIENT TYPE:  INP   LOCATION:  6527                         FACILITY:  [facility]   PHYSICIAN:  Charlies Constable, M.D. [facility] DATE OF BIRTH:  25-Aug-1945   DATE OF ADMISSION:  07/19/2005  DATE OF DISCHARGE:  07/20/2005                                 DISCHARGE SUMMARY   PROCEDURES:  1.  Cardiac catheterization.  2.  Coronary arteriogram.  3.  Left ventriculogram.  4.  Left internal mammary artery arteriogram.  5.  Graft angiogram.  6.  Percutaneous transluminal coronary angioplasty and Taxus stent to one      vessel.   PRIMARY DIAGNOSIS:  Unstable anginal pain.   SECONDARY DIAGNOSES:  1.  Status post aortocoronary bypass surgery in 1993 with LIMA to LAD, SVG      to OM and PLA, SVG to AM and PD.  2.  Status post and overlapping drug-eluting stents to the RCA in April      2007.  3.  History of volume overload.  4.  Hyperlipidemia.  5.  DJD in his back.  6.  History of nephrolithiasis.   The time of discharge 33 minutes.   HOSPITAL COURSE:  [name] is a 65 year old male with known coronary artery  disease.  He had a non-ST segment elevation MI in April 2007 with tandem  drug-eluting stents placed to the RCA.  The distal limb of the SVG to acute  marginal and PDA was totaled.  His EF at that time was 60%.  He was having  exertional left arm, back and neck pain as well as increased fatigued.  He  was evaluated by Dr. Juanda Chance and scheduled for cardiac catheterization on  July 19, 2005.   The cardiac catheterization showed an 80-90% stenosis distal to the  previously placed RCA stent.  His other grafts were patent, and other  residual coronary artery disease was unchanged.  He had a Taxus stent placed  to the RCA for peri-stent restenosis.  The stenosis was reduced from 90% to  0.   The next day, his CK-MB was negative.  He has not had a recent lipid  profile, but he is to continue  his statin and get a lipid profile as an  outpatient.  He was evaluated by Dr. Juanda Chance and considered stable for  discharge on July 20, 2005 with outpatient follow-up arranged.   DISCHARGE INSTRUCTIONS:  His activity level is to be increased gradually.  He is to call our office for problems with the cath site.  He is to stick to  a low-fat diet.  He is not to drive for 2 days and not to lift anything  heavy for a week.   DISCHARGE MEDICATIONS:  1.  Plavix 75 mg q.d.  2.  Aspirin 325 mg q.d.  3.  Lovastatin 40 mg 2 tablets q.d.  4.  Metoprolol 50 mg b.i.d.  5.  Lisinopril as prior to admission  6.  Sublingual nitroglycerin p.r.n.   LABORATORY VALUES:  CK-MB post procedure 53/0.8.  BUN 10,  creatinine 1.5 at  discharge.  Blood sugar 108.   Per Dr. Juanda Chance, Mr. Meiring is to obtain a fasting lipid profile or obtained a  lipid profile at his next office visit.      Theodore Demark, P.A. [facility]    ______________________________  Charlies Constable, M.D. [facility]    RB/MEDQ  D:  07/20/2005  T:  07/20/2005  Job:  [id number]   cc:   Mechele Collin, M.D.  [place], [place]

## 2010-05-19 NOTE — Assessment & Plan Note (Signed)
[facility]                              CARDIOLOGY OFFICE NOTE   RIVAN, SIORDIA                         MRN:          [id number]  DATE:08/01/2005                            DOB:          1945/02/05   PRIMARY CARE PHYSICIAN:  None.   CLINICAL HISTORY:  Mr. Sermons is 65 years old, and had bypass surgery in  1993.  In April of this year he had a non-ST elevation infarction, underwent  an intervention of the right coronary artery with placement of tandem,  overlying, drug-eluting stents.  He had some symptoms of neck pain after  this, and we were not sure if it was angina, and we did a Cardiolite scan,  which was borderline.  Because of persistent symptoms we brought him into  the hospital for catheterization, and this showed a restenosis at the distal  edge of the distal stent.  He underwent placement of a new Taxus drug-  eluting stent with a good result.   He has done well since that time, has had no recurrence of his neck and arm  pain.   PAST MEDICAL HISTORY:  Significant for hyperlipidemia and degenerative disk  disease of the lumbar spine.   CURRENT MEDICATIONS:  Include lisinopril, aspirin, lovastatin, metoprolol  and clopidogrel.   PHYSICAL EXAMINATION:  VITAL SIGNS:  Blood pressure is 124/70, the pulse is  60 and regular.  NECK:  There was no venous distention.  The carotid pulses are full without  bruits.  CHEST:  Clear.  CARDIAC:  Rhythm was regular.  I can hear no murmurs or gallops.  ABDOMEN:  Soft without organomegaly.  EXTREMITIES:  Peripheral pulses are full, and there is no peripheral edema.  The right groin site had a small knot, but there was no widened pulsation.   IMPRESSION:  1.  Coronary artery disease, status post coronary bypass graft surgery in      1993, status post placement of tandem, overlapping, drug-eluting stents      April 2007 and placement of a third drug-eluting stent overlapping the      other 2 for  edge restenosis.  2.  Good left ventricular function.  3.  Hyperlipidemia.  4.  Degenerative disk disease of the lumbar spine.   RECOMMENDATIONS:  I think Mr. Veasey is doing well.  I encouraged him to  gradually increase his activity.  We will plan to continue his same  medications.  I will see him back in 5 months.                               Bruce Elvera Lennox Juanda Chance, MD, [facility]   BRB/MedQ  DD:  08/01/2005  DT:  08/02/2005  Job #:  [id number]

## 2010-05-19 NOTE — Cardiovascular Report (Signed)
[name], OTTING NO.:  [id number]   MEDICAL RECORD NO.:  [id number]          PATIENT TYPE:  OIB   LOCATION:  1962                         FACILITY:  [facility]   PHYSICIAN:  Charlies Constable, M.D. 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  He had  bypass surgery in 1993.  He was admitted with a non-ST-elevation infarction  in April 2007 and underwent placement of two tandem overlapping Taxus stents  in the proximal to mid right coronary artery.  Both the native vessel and  graft to this artery were blocked and we elected to treat the native vessel.  He has developed recurrent symptoms of neck and arm pain similar to what his  previous symptoms were like and he had a Cardiolite scan that showed  borderline inferior ischemia.  Because of persistent symptoms we arranged  for him to have catheterization performed today in the outpatient  laboratory.   PROCEDURE:  Right heart catheterization was performed percutaneously through  the right femoral vein using a venous sheath and Swan-Ganz thermodilution  catheter.  Left heart catheterization was performed percutaneously through  the right femoral artery using an arterial sheath and 5-French preformed  coronary catheters.  A femoral wall arterial punch was performed and  Omnipaque contrast was used.  We used the 3-D right catheter for injection  of the vein graft to the circumflex artery and the internal mammary artery.  There was slight damping with the 3-D catheter in the native right coronary,  so we switched to a 4-French JR-4 catheter.  No left ventriculogram was  performed to save contrast.  The patient tolerated the procedure well.   The patient's creatinine was 1.7.  He was given bicarbonate protocol before  and during the procedure.   RESULTS:   Hemodynamic data:  The right atrial pressure was 5 mean.  The  pulmonary artery pressure was 28/5 with a mean of 15.  The pulmonary artery  wedge pressure was 7 mean.  The aortic pressure was 104/53 with a mean of 73  and left ventricle pressure was 104/7.  Cardiac output and cardiac index by  Fick was 3.2/1.9 L/minute/sqm.   The left main was free of significant disease.   The left anterior descending artery gave rise to a diagonal branch and a  septal perforator and then there was competing flow distally.  There was 80%  stenosis before and encompassing the first diagonal branch.   The circumflex artery was completely occluded near its origin.   The right coronary artery was a dominant vessel that gave rise to a conus  branch, a right ventricle branch, a posterior descending branch, and two  small posterolateral branches.  There was 40% ostial stenosis.  There was  less than 10% stenosis at the two overlapping stents in the mid vessel.  There was 80-90% stenosis just distal to the stent.   The saphenous vein graft to the marginal  and posterolateral branch of the  circumflex artery was patent and functioned normally.   The saphenous vein graft to the acute marginal branch and posterior  descending branch of the circumflex artery was patent in its proximal limb  but occluded in its distal limb.  There was 80% stenosis in the acute  marginal branch just after the insertion site.   The LIMA graft to LAD was patent and functioned well.   No left ventriculogram was performed.   CONCLUSION:  1.  Coronary artery disease status post coronary bypass graft surgery in      1993 and status post percutaneous coronary intervention of the native      right coronary as described above.  2.  Severe native vessel disease with 80% stenosis in the proximal left      anterior descending artery , total occlusion of the proximal circumflex      artery, 40% ostial stenosis in the right coronary artery  with less than      10% stenosis at the stent sites in the mid right coronary, with 80-90%      stenosis just distal to the distal edge of the stent.  3.  Patent vein graft to the marginal and posterolateral branch of the      circumflex artery, patent left internal mammary artery graft to left      anterior descending artery, and occlusion of the distal limb of the      sequential vein graft to the acute marginal and posterior descending      branches of the right coronary with 80% stenosis in acute marginal      branch after the insertion site.  4.  Normal left ventricular function by noninvasive studies.   RECOMMENDATIONS:  The patient has developed persistent restenosis at the  distal edge of the overlapping drug-eluting stents in the mid right coronary  artery.  Will plan to bring the patient to the main laboratory today for  intervention.   ADDENDUM:  Right heart catheterization was done because of the patient's  symptoms of shortness of breath and history of some fluid retention related  to diastolic dysfunction.           ______________________________  Charlies Constable, M.D. [facility]     BB/MEDQ  D:  07/19/2005  T:  07/19/2005  Job:  [id number]   cc:   Cardiopulmonary Lab

## 2010-05-24 ENCOUNTER — Telehealth: Payer: Self-pay | Admitting: Cardiovascular Disease

## 2010-05-24 NOTE — Telephone Encounter (Signed)
I spoke with the pt and he complains of numbness on the bottom of left foot and numbness in right leg  from his knee up to hip.  The pt's symptoms started about 3 weeks ago and are getting worse.  The pt also complains of bilateral numbness in his hands and fingertips.  The pt does notice that his feet and hands get very pale when they are numb.  Also when the pt develops this numbness he develops SOB. The pt states that he cannot walk more than 150 feet due to fatigue.  The pt saw his PCP Dr Francee Piccolo a few weeks ago and was told everything looked okay.  The pt does have a history of low back fusion in 1999.  I made the pt aware that he should contact his PCP and let him know that his symptoms are getting worse. I made the pt aware that his symptoms could be related to nerves and muscle.  The pt also complains that his hips feel like they are out of joint.  The pt agreed to contact his PCP for further evaluation.  I made the pt aware that after he is evaluated by his PCP he may want to consider holding his statin for 10 days to see if any of his symptoms are related to Crestor. The pt said he would call our office before stopping his statin if he felt this was necessary.

## 2010-05-24 NOTE — Telephone Encounter (Signed)
Pt wife states pt is having sob pt fingers are really white pt is weak. Pt wife would like to talk to lauren.

## 2010-06-05 ENCOUNTER — Inpatient Hospital Stay (HOSPITAL_COMMUNITY)
Admission: EM | Admit: 2010-06-05 | Discharge: 2010-06-07 | DRG: 378 | Disposition: A | Discharge status: Home / Self Care | Payer: Medicare Other | Attending: Family Medicine | Admitting: Family Medicine

## 2010-06-05 ENCOUNTER — Emergency Department (HOSPITAL_COMMUNITY): Payer: Medicare Other

## 2010-06-05 DIAGNOSIS — Z7982 Long term (current) use of aspirin: Secondary | ICD-10-CM

## 2010-06-05 DIAGNOSIS — Z9861 Coronary angioplasty status: Secondary | ICD-10-CM

## 2010-06-05 DIAGNOSIS — I2510 Atherosclerotic heart disease of native coronary artery without angina pectoris: Secondary | ICD-10-CM | POA: Diagnosis present

## 2010-06-05 DIAGNOSIS — Z951 Presence of aortocoronary bypass graft: Secondary | ICD-10-CM

## 2010-06-05 DIAGNOSIS — I252 Old myocardial infarction: Secondary | ICD-10-CM

## 2010-06-05 DIAGNOSIS — R0789 Other chest pain: Secondary | ICD-10-CM | POA: Diagnosis present

## 2010-06-05 DIAGNOSIS — K2990 Gastroduodenitis, unspecified, without bleeding: Secondary | ICD-10-CM | POA: Diagnosis present

## 2010-06-05 DIAGNOSIS — E785 Hyperlipidemia, unspecified: Secondary | ICD-10-CM | POA: Diagnosis present

## 2010-06-05 DIAGNOSIS — I10 Essential (primary) hypertension: Secondary | ICD-10-CM | POA: Diagnosis present

## 2010-06-05 DIAGNOSIS — N289 Disorder of kidney and ureter, unspecified: Secondary | ICD-10-CM | POA: Diagnosis present

## 2010-06-05 DIAGNOSIS — K922 Gastrointestinal hemorrhage, unspecified: Principal | ICD-10-CM | POA: Diagnosis present

## 2010-06-05 DIAGNOSIS — I200 Unstable angina: Secondary | ICD-10-CM | POA: Diagnosis present

## 2010-06-05 DIAGNOSIS — I517 Cardiomegaly: Secondary | ICD-10-CM | POA: Diagnosis present

## 2010-06-05 DIAGNOSIS — K2970 Gastritis, unspecified, without bleeding: Secondary | ICD-10-CM | POA: Diagnosis present

## 2010-06-05 DIAGNOSIS — R1310 Dysphagia, unspecified: Secondary | ICD-10-CM | POA: Diagnosis present

## 2010-06-05 DIAGNOSIS — K222 Esophageal obstruction: Secondary | ICD-10-CM | POA: Diagnosis present

## 2010-06-05 DIAGNOSIS — IMO0002 Reserved for concepts with insufficient information to code with codable children: Secondary | ICD-10-CM | POA: Diagnosis present

## 2010-06-05 DIAGNOSIS — D509 Iron deficiency anemia, unspecified: Secondary | ICD-10-CM | POA: Diagnosis present

## 2010-06-05 LAB — COMPREHENSIVE METABOLIC PANEL
AST: 20 U/L (ref 0–37)
Albumin: 3 g/dL — ABNORMAL LOW (ref 3.5–5.2)
Alkaline Phosphatase: 72 U/L (ref 39–117)
CO2: 24 mEq/L (ref 19–32)
Chloride: 109 mEq/L (ref 96–112)
Creatinine, Ser: 1.63 mg/dL — ABNORMAL HIGH (ref 0.4–1.5)
GFR calc Af Amer: 52 mL/min — ABNORMAL LOW (ref >60)
GFR calc non Af Amer: 43 mL/min — ABNORMAL LOW (ref >60)
Potassium: 4 mEq/L (ref 3.5–5.1)
Total Bilirubin: 0.1 mg/dL — ABNORMAL LOW (ref 0.3–1.2)

## 2010-06-05 LAB — DIFFERENTIAL
Basophils Absolute: 0.1 10*3/uL (ref 0.0–0.1)
Basophils Relative: 1 % (ref 0–1)
Eosinophils Absolute: 0.2 10*3/uL (ref 0.0–0.7)
Lymphocytes Relative: 22 % (ref 12–46)
Monocytes Relative: 14 % — ABNORMAL HIGH (ref 3–12)
Neutro Abs: 3.4 10*3/uL (ref 1.7–7.7)
Neutrophils Relative %: 59 % (ref 43–77)

## 2010-06-05 LAB — CBC
HCT: 22.8 % — ABNORMAL LOW (ref 39.0–52.0)
Hemoglobin: 6.6 g/dL — CL (ref 13.0–17.0)
MCH: 21.5 pg — ABNORMAL LOW (ref 26.0–34.0)
MCHC: 28.9 g/dL — ABNORMAL LOW (ref 30.0–36.0)
MCV: 74.3 fL — ABNORMAL LOW (ref 78.0–100.0)
RBC: 3.07 MIL/uL — ABNORMAL LOW (ref 4.22–5.81)

## 2010-06-05 LAB — CK TOTAL AND CKMB (NOT AT ARMC)
CK, MB: 2.3 ng/mL (ref 0.3–4.0)
Relative Index: 0.4 (ref 0.0–2.5)

## 2010-06-05 LAB — POCT I-STAT, CHEM 8
BUN: 21 mg/dL (ref 6–23)
Creatinine, Ser: 1.8 mg/dL — ABNORMAL HIGH (ref 0.4–1.5)
Hemoglobin: 7.8 g/dL — ABNORMAL LOW (ref 13.0–17.0)
Potassium: 4.1 mEq/L (ref 3.5–5.1)
Sodium: 143 mEq/L (ref 135–145)

## 2010-06-05 LAB — TROPONIN I: Troponin I: <0.3 ng/mL (ref <0.30)

## 2010-06-05 LAB — CARDIAC PANEL(CRET KIN+CKTOT+MB+TROPI)
CK, MB: 2.1 ng/mL (ref 0.3–4.0)
Relative Index: 0.4 (ref 0.0–2.5)
Total CK: 475 U/L — ABNORMAL HIGH (ref 7–232)

## 2010-06-05 LAB — ABO/RH: ABO/RH(D): B POS

## 2010-06-05 LAB — FERRITIN: Ferritin: 6 ng/mL — ABNORMAL LOW (ref 22–322)

## 2010-06-06 DIAGNOSIS — D509 Iron deficiency anemia, unspecified: Secondary | ICD-10-CM

## 2010-06-06 DIAGNOSIS — R195 Other fecal abnormalities: Secondary | ICD-10-CM

## 2010-06-06 LAB — CBC
HCT: 23.4 % — ABNORMAL LOW (ref 39.0–52.0)
Hemoglobin: 7.2 g/dL — ABNORMAL LOW (ref 13.0–17.0)
RBC: 3.14 MIL/uL — ABNORMAL LOW (ref 4.22–5.81)
WBC: 6.4 10*3/uL (ref 4.0–10.5)

## 2010-06-06 LAB — BASIC METABOLIC PANEL
Calcium: 8.2 mg/dL — ABNORMAL LOW (ref 8.4–10.5)
Creatinine, Ser: 1.47 mg/dL (ref 0.4–1.5)
GFR calc Af Amer: 59 mL/min — ABNORMAL LOW (ref >60)
GFR calc non Af Amer: 48 mL/min — ABNORMAL LOW (ref >60)
Sodium: 141 mEq/L (ref 135–145)

## 2010-06-06 LAB — CARDIAC PANEL(CRET KIN+CKTOT+MB+TROPI)
CK, MB: 1.7 ng/mL (ref 0.3–4.0)
Total CK: 342 U/L — ABNORMAL HIGH (ref 7–232)

## 2010-06-07 DIAGNOSIS — K2900 Acute gastritis without bleeding: Secondary | ICD-10-CM

## 2010-06-07 DIAGNOSIS — K222 Esophageal obstruction: Secondary | ICD-10-CM

## 2010-06-07 LAB — CROSSMATCH
ABO/RH(D): B POS
Antibody Screen: NEGATIVE
Unit division: 0
Unit division: 0

## 2010-06-07 LAB — COMPREHENSIVE METABOLIC PANEL
AST: 16 U/L (ref 0–37)
Albumin: 2.8 g/dL — ABNORMAL LOW (ref 3.5–5.2)
Calcium: 8.4 mg/dL (ref 8.4–10.5)
Chloride: 109 mEq/L (ref 96–112)
Creatinine, Ser: 1.73 mg/dL — ABNORMAL HIGH (ref 0.4–1.5)
GFR calc Af Amer: 49 mL/min — ABNORMAL LOW (ref >60)
Total Bilirubin: 0.2 mg/dL — ABNORMAL LOW (ref 0.3–1.2)
Total Protein: 6.1 g/dL (ref 6.0–8.3)

## 2010-06-07 LAB — DIFFERENTIAL
Basophils Absolute: 0 10*3/uL (ref 0.0–0.1)
Eosinophils Absolute: 0.3 10*3/uL (ref 0.0–0.7)
Lymphocytes Relative: 19 % (ref 12–46)
Monocytes Absolute: 0.8 10*3/uL (ref 0.1–1.0)
Neutro Abs: 4.2 10*3/uL (ref 1.7–7.7)
Neutrophils Relative %: 65 % (ref 43–77)

## 2010-06-07 LAB — CBC
MCH: 23 pg — ABNORMAL LOW (ref 26.0–34.0)
Platelets: 259 10*3/uL (ref 150–400)
RBC: 3.78 MIL/uL — ABNORMAL LOW (ref 4.22–5.81)
RDW: 16.2 % — ABNORMAL HIGH (ref 11.5–15.5)

## 2010-06-08 ENCOUNTER — Telehealth: Payer: Self-pay

## 2010-06-08 DIAGNOSIS — D500 Iron deficiency anemia secondary to blood loss (chronic): Secondary | ICD-10-CM

## 2010-06-08 NOTE — Telephone Encounter (Signed)
Patient is scheduled for a capsule endoscopy for 06/20/10 8:00.  He will come for a pre-visit on 06/13/10 2:00.

## 2010-06-09 NOTE — Discharge Summary (Signed)
  NAMEOREY, MOURE NO.:  [id number]  MEDICAL RECORD NO.:  [id number]  LOCATION:  4714                         FACILITY:  [facility]  PHYSICIAN:  Standley Dakins, MD   DATE OF BIRTH:  11-Aug-1945  DATE OF ADMISSION:  06/05/2010 DATE OF DISCHARGE:  06/07/2010                              DISCHARGE SUMMARY   PRIMARY CARE PHYSICIAN:  Donata Duff, MD  DISCHARGE DIAGNOSES: 1. Atypical chest pain. 2. Coronary artery disease. 3. Angina. 4. Gastritis. 5. Anemia and iron-deficiency. 6. Hypertension. 7. Hyperlipidemia. 8. Degenerative disk disease of the neck and back. 9. History of multiple kidney stones.  DISCHARGE MEDICATIONS: 1. Aspirin 325 mg 1 p.o. daily. 2. Ranexa 500 mg b.i.d. 3. Amlodipine 10 mg daily. 4. Crestor 40 mg daily. 5. Metoprolol 50 mg twice daily. 6. Nitroglycerin 0.4 mg tabs sublingual q.5 minutes x3 p.r.n. chest     pain. 7. Plavix 75 mg p.o. daily. 8. Prilosec 20 mg p.o. daily. 9. Iron sulfate 325 mg 1 p.o. daily.  HOSPITAL COURSE:  Briefly, this patient is a 65 year old gentleman with a history of severe coronary artery disease, status post CABG, who presented to the emergency department with several days of atypical chest pain.  This was thought to be related to gastritis and the patient was discovered to have anemia requiring transfusion of up to 2 units of packed red blood cells.  The patient tolerated this very well.  The patient was seen in consultation by Gastroenterology, who did an upper endoscopy study and the results were consistent with gastritis, but no active source of bleeding noted.  They have recommended that the patient have capsule endoscopy with Dr. Juanda Chance, on an outpatient setting.  The patient reported significant improvement after transfusion of packed red blood cells.  He will be asked to continue his proton pump inhibitor therapy in addition to starting iron sulfate.  In addition, Ranexa was added to his  medication regimen.  I have recommended that the patient have close followup with Dr. Juanda Chance, his gastroenterologist and his cardiologist as well.  Also with primary care provider.  DISCHARGE CONDITION:  Stable.  DISPOSITION:  The patient will be discharged home with his family members.  ACTIVITY:  As tolerated.  DIET:  Cardiac diet recommended.  SPECIAL INSTRUCTIONS:  Start iron sulfate 1 tablet daily.  Return if symptoms recur or worsen or new changes develop.  The patient verbalized understanding.  I spent 35 minutes preparing this patient's discharge including reviewing his medical records, arranging followup and writing prescriptions and doing dictations for close followup.     Standley Dakins, MD     CJ/MEDQ  D:  06/07/2010  T:  06/07/2010  Job:  [id number]  cc:   Donata Duff, MD  Electronically Signed by Standley Dakins  on 06/09/2010 06:22:16 PM

## 2010-06-12 ENCOUNTER — Telehealth: Payer: Self-pay

## 2010-06-12 ENCOUNTER — Encounter: Payer: Self-pay | Admitting: *Deleted

## 2010-06-12 NOTE — Telephone Encounter (Signed)
Dr. Juanda Chance pt is scheduled for capsule endo per Dr. Marina Goodell on 06/20/10. When do you want a follow-up OV scheduled with the pt. Please advise.

## 2010-06-12 NOTE — Telephone Encounter (Signed)
Let's see what the capsule shows. Is not he supposed to hold his Iron before SBCE?

## 2010-06-12 NOTE — H&P (Signed)
[name], [name] NO.:  [id number]  MEDICAL RECORD NO.:  [id number]  LOCATION:  [facility]                         FACILITY:  [facility]  PHYSICIAN:  Lonia Blood, M.D.       DATE OF BIRTH:  10/06/1945  DATE OF ADMISSION:  06/05/2010 DATE OF DISCHARGE:                             HISTORY & PHYSICAL   PRIMARY CARE PHYSICIAN:  Donata Duff, [place] in [place].  CHIEF COMPLAINT:  Chest pain.  HISTORY OF PRESENT ILLNESS:  [name] is a 65 year old gentleman with history of severe coronary disease, status post coronary bypass grafting, who has had occlusion of the SVG to the OM2 and OM3 back in January 2012.  At that time, he had a rescue PCI to the SVG with drug- eluting stent with good results.  He is also status post drug-eluting stent to the RCA.  He was brought today to the emergency room by his wife after he has been complaining of about a couple of days of severe substernal chest pain.  The pain is constant and he says is not like his heart attack pain.  He has not noticed any melena, hematemesis.  He has not changed any of his medication recently.  He obviously is on both aspirin and Plavix for his regular dispense.  His wife said that she noticed that he was pale for a couple months and then he may have also a tinge of jaundice.  PAST MEDICAL HISTORY: 1. Coronary artery disease status post four-vessel coronary artery     bypass graft in 1993, now with disease grafts. 2. Hypertension. 3. Hyperlipidemia. 4. Degenerative disk disease of the neck and back. 5. Multiple kidney stones.  HOME MEDICATIONS:  Lisinopril, simvastatin, metoprolol, Plavix, aspirin, and Zocor.  SOCIAL HISTORY:  [place] in [place], [place].  Retired from Visual merchandiser.  Quit smoking in 1989.  No alcohol.  No drugs.  FAMILY HISTORY:  Mother died of coronary artery disease.  Father died at young age.  REVIEW OF SYSTEMS:  As per HPI.  All other systems  reviewed is negative.  PHYSICAL EXAMINATION:  VITAL SIGNS:  Upon admission, temperature 98.4, blood pressure 109/42, pulse 56, respiratory 18, saturating 99% on room air. GENERAL:  The patient is alert, oriented, in no acute distress. HEAD:  Normocephalic, atraumatic. EYES:  Pupils equal, round, react to admission.  Conjunctiva pale. Sclerae very mild icterus. THROAT:  Clear. NECK:  Supple.  No JVD. CHEST:  Clear to auscultation.  No wheezes, rhonchi, or crackles. HEART:  Regular rate and rhythm without murmurs, rubs, or gallops. ABDOMEN:  Soft, nontender, nondistended.  Bowel sounds are present. LOWER EXTREMITIES:  Without edema. SKIN:  Warm and dry without suspicious looking rashes. NEUROLOGIC:  Cranial nerves II through XII intact.  Strength 5/5 in all four extremities.  Sensation intact.  LABORATORY VALUES:  On admission, white blood cell count is 5.8, hemoglobin 6.6, platelet count is 268, MCV is 74.  CK 593, CK-MB 2.3, troponin-I less than 0.3.  ASSESSMENT AND PLAN:  This is a 65 year old gentleman with complaints of chest pain.  This is not typical angina.  His chest pain seems to  be related to esophagitis/gastritis.  He does not have any new EKG changes. Normal cardiac enzymes despite the chest pain being going on for a couple days.  He does have a microcytic anemia, which is new in nature. I suspect that he probably has chronic gastrointestinal losses in the setting of using aspirin and Plavix.  He is going to be admitted to the hospital due to his severe extensive coronary artery disease and severe anemia.  He will be transfused 2 units of packed red blood cells after anemia panel, LDH, and complete metabolic profile will be obtained.  The gastroenterologist with [facility] has been called to consider endoscopy and colonoscopy to make a comment on safety and continue Plavix and aspirin.  For tonight, I am going to hold the Plavix and aspirin with the goal of resuming the  both tomorrow after the procedures.     Lonia Blood, M.D.     SL/MEDQ  D:  06/05/2010  T:  06/05/2010  Job:  [id number]  cc:   Veverly Fells. Excell Seltzer, MD Donata Duff, MD Hedwig Morton. Juanda Chance, MD  Electronically Signed by Lonia Blood M.D. on 06/12/2010 09:50:00 AM

## 2010-06-12 NOTE — Telephone Encounter (Signed)
Yes, thanks for reminding me!

## 2010-06-12 NOTE — Telephone Encounter (Signed)
First available appointment unless results contain abnormalities which need to be discussed right away.

## 2010-06-12 NOTE — Telephone Encounter (Signed)
Notified pt of his CE teaching tomorrow and that he needs to hold his Iron. Pt stated understanding and we will see if the iron is causing his nausea.

## 2010-06-12 NOTE — Telephone Encounter (Addendum)
Spoke with pt's wife and I gave her pt's appt for his Capsule Endo teaching tomorrow and then his Capsule next week. His f/u os 07/24/10 unless the Capsule shows something- I will send him a reminder note with his appts. Wife stated the pt got sick yesterday am and vomited and then had diarrhea all day. She stated pt was started on Ranexa by the cardiologist and Ferrous sulfate; both listed nausea as a side effect. Wife thinks it's the iron, but stated he does well with IV iron. Please advise. Thanks.

## 2010-06-13 ENCOUNTER — Ambulatory Visit (INDEPENDENT_AMBULATORY_CARE_PROVIDER_SITE_OTHER): Payer: Medicare Other | Admitting: Internal Medicine

## 2010-06-13 DIAGNOSIS — D509 Iron deficiency anemia, unspecified: Secondary | ICD-10-CM

## 2010-06-15 NOTE — Progress Notes (Signed)
Patient here for capsule Endoscopy teaching .  All instructions reviewed and provided to the patient in witting.

## 2010-06-19 ENCOUNTER — Telehealth: Payer: Self-pay | Admitting: Internal Medicine

## 2010-06-19 NOTE — Telephone Encounter (Signed)
Left a message for patient to call me. 

## 2010-06-19 NOTE — Telephone Encounter (Signed)
Gave patient's wife Capsule endo instructions over the phone. Patient did not have his instructions.

## 2010-06-20 ENCOUNTER — Telehealth: Payer: Self-pay | Admitting: *Deleted

## 2010-06-20 ENCOUNTER — Ambulatory Visit (INDEPENDENT_AMBULATORY_CARE_PROVIDER_SITE_OTHER): Payer: Medicare Other

## 2010-06-20 DIAGNOSIS — D500 Iron deficiency anemia secondary to blood loss (chronic): Secondary | ICD-10-CM

## 2010-06-20 DIAGNOSIS — R933 Abnormal findings on diagnostic imaging of other parts of digestive tract: Secondary | ICD-10-CM

## 2010-06-20 DIAGNOSIS — K633 Ulcer of intestine: Secondary | ICD-10-CM

## 2010-06-20 DIAGNOSIS — D509 Iron deficiency anemia, unspecified: Secondary | ICD-10-CM

## 2010-06-20 DIAGNOSIS — K5520 Angiodysplasia of colon without hemorrhage: Secondary | ICD-10-CM

## 2010-06-20 NOTE — Telephone Encounter (Signed)
Pt here for Capsule Endoscopy. After Capsule was swallowed,leads were connected and computer connected. Pt lied on his side here x 30 minutes andwas sent home with instructions. Pt informed when he could drink- 1030am and when he could have a light lunch at 1230PM. Pt stated understanding.  Pill:     LOT 2011-12/17329S                                                       25  Pt back at 4:30pm and connections removed and computer cartridge placed in receptor for processing.

## 2010-06-21 ENCOUNTER — Other Ambulatory Visit: Payer: Self-pay | Admitting: *Deleted

## 2010-06-21 ENCOUNTER — Encounter: Payer: Self-pay | Admitting: Cardiovascular Disease

## 2010-06-21 MED ORDER — CLOPIDOGREL BISULFATE 75 MG PO TABS: 75.0000 mg | ORAL_TABLET | Freq: Every day | ORAL | Status: DC

## 2010-06-21 MED ORDER — METOPROLOL TARTRATE 50 MG PO TABS: 50.0000 mg | ORAL_TABLET | Freq: Two times a day (BID) | ORAL | Status: DC

## 2010-06-21 MED ORDER — OMEPRAZOLE MAGNESIUM 20 MG PO TBEC: 1.0000 mg | DELAYED_RELEASE_TABLET | Freq: Two times a day (BID) | ORAL | Status: DC

## 2010-06-29 ENCOUNTER — Telehealth: Payer: Self-pay

## 2010-06-29 NOTE — Telephone Encounter (Signed)
Patient is advised of the results of the capsule.  He is scheduled for colon 07/04/10.  He will come for a pre-visit tomorrow.  I have sent a letter to Dr Earmon Phoenix office about plavix instructions.

## 2010-06-29 NOTE — Telephone Encounter (Signed)
Error

## 2010-06-29 NOTE — Progress Notes (Signed)
Meryl Dare, MD,FACG.

## 2010-06-29 NOTE — Progress Notes (Signed)
Capsule Endo report  Ordering MD: [name], MD Reading MD: Claudette Head, MD  Reason for referral:  65 year old male recently hospitalized with chest pain, anemia - iron deficiency.  Patient on ASA and Plavix.  EGD 06/21/10 showed a distal stricture.  Last colon 2010.  Procedure info and findings: 1) Complete study, fair prep.  2 ) prolonged gastric retention to 4 hours.  3) Few very small AVMs scattered.  4) Possible cecal mass.abnormal cecum with ulceration/edema.  Summary and Recommendations:  1) Few very small AVM's 2) Ulceration and abnormal appearing cecum or ascending colon - cannot r/o mass.  Recommend colonoscopy

## 2010-06-29 NOTE — Progress Notes (Signed)
Thank You for the report. Sheri, please schedule pt for colonoscopy, DB

## 2010-06-30 ENCOUNTER — Telehealth: Payer: Self-pay

## 2010-06-30 MED ORDER — PEG-KCL-NACL-NASULF-NA ASC-C 100 G PO SOLR: ORAL | Status: DC

## 2010-06-30 NOTE — Telephone Encounter (Signed)
Dr Excell Seltzer called and stated he did not want the patient to come off of the Plavix at this time.  He had a drug eluting stent placed just 5 months ago.  He would like to discuss with Dr Juanda Chance when she returns.Is it  possible to perform the colon with the patient on Plavix? Per Dr Earmon Phoenix request I have advised the patient to take his plavix and explained Dr Earmon Phoenix response.  He will take today's dose now and continue as ordered.    Dr Juanda Chance when you get back please call Dr Excell Seltzer at [phone] (c).  The patient is still scheduled for Wed 07/04/10 incase you still want to proceed with him on Plavix.  The patient is aware we will contact him once Dr Juanda Chance and Excell Seltzer have talked.

## 2010-06-30 NOTE — Telephone Encounter (Signed)
Sherri from [facility]  Pt had a m2a capsule study revealing an ulcerated mass in the cecum.  They need clearance to stop Plavix today for a colonoscopy and possible biopsy on 07/04/10.  Dr. Excell Seltzer is aware  will contact Sherri at [phone] and call back. Mylo Red RN

## 2010-06-30 NOTE — Telephone Encounter (Signed)
Patient given instructions for colon

## 2010-07-01 NOTE — Telephone Encounter (Addendum)
It sounds like the patient is at high risk of colon cancer based on findings of capsule endoscopy. He was treated with overlapping DES in a saphenous vein graft 01/18/10. It may be best to get him out to at least 6 months before interrupting his plavix to reduce risk of stent thrombosis. He is approaching 6 months from treatment in mid-July. The other option would be to biopsy him on plavix but I suspect there would be a high-risk of significant bleed. Will d/w Dr Juanda Chance.

## 2010-07-02 NOTE — Telephone Encounter (Signed)
OK to keep appointment for colon on 07/04/2010, please stay on Plavix. It is safe to biopsy.

## 2010-07-02 NOTE — Telephone Encounter (Signed)
It is safe to do the colonoscopy on Plavix, it is safe to obtain biopsies. Keep his appointment, please.

## 2010-07-03 NOTE — Telephone Encounter (Signed)
Spoke with patient's wife and gave her the instructions for patient to stay on Plavix and to keep 07/04/10 appointment for colon. She will have patient call me for any questions.

## 2010-07-04 ENCOUNTER — Ambulatory Visit (AMBULATORY_SURGERY_CENTER): Payer: Medicare Other | Admitting: Internal Medicine

## 2010-07-04 ENCOUNTER — Encounter: Payer: Self-pay | Admitting: Internal Medicine

## 2010-07-04 ENCOUNTER — Other Ambulatory Visit (INDEPENDENT_AMBULATORY_CARE_PROVIDER_SITE_OTHER): Payer: Medicare Other

## 2010-07-04 VITALS — BP 116/68 | HR 56 | Temp 97.0°F | Resp 12 | Ht 66.0 in | Wt 180.0 lb

## 2010-07-04 DIAGNOSIS — K921 Melena: Secondary | ICD-10-CM

## 2010-07-04 DIAGNOSIS — D126 Benign neoplasm of colon, unspecified: Secondary | ICD-10-CM

## 2010-07-04 DIAGNOSIS — K5289 Other specified noninfective gastroenteritis and colitis: Secondary | ICD-10-CM

## 2010-07-04 DIAGNOSIS — D649 Anemia, unspecified: Secondary | ICD-10-CM

## 2010-07-04 DIAGNOSIS — K5730 Diverticulosis of large intestine without perforation or abscess without bleeding: Secondary | ICD-10-CM

## 2010-07-04 DIAGNOSIS — R195 Other fecal abnormalities: Secondary | ICD-10-CM

## 2010-07-04 DIAGNOSIS — D509 Iron deficiency anemia, unspecified: Secondary | ICD-10-CM

## 2010-07-04 DIAGNOSIS — D500 Iron deficiency anemia secondary to blood loss (chronic): Secondary | ICD-10-CM

## 2010-07-04 DIAGNOSIS — R933 Abnormal findings on diagnostic imaging of other parts of digestive tract: Secondary | ICD-10-CM

## 2010-07-04 LAB — CBC WITH DIFFERENTIAL/PLATELET
Basophils Absolute: 0 10*3/uL (ref 0.0–0.1)
HCT: 32.3 % — ABNORMAL LOW (ref 39.0–52.0)
Hemoglobin: 10.7 g/dL — ABNORMAL LOW (ref 13.0–17.0)
Lymphs Abs: 1.1 10*3/uL (ref 0.7–4.0)
Monocytes Relative: 8.8 % (ref 3.0–12.0)
Neutro Abs: 3.7 10*3/uL (ref 1.4–7.7)
RDW: 22.8 % — ABNORMAL HIGH (ref 11.5–14.6)

## 2010-07-04 MED ORDER — SODIUM CHLORIDE 0.9 % IV SOLN: 500.0000 mL | INTRAVENOUS | Status: DC

## 2010-07-04 NOTE — Patient Instructions (Signed)
Discharge instructions given with verbal understanding.  Handouts on polyps and diverticulosis given.  Resume previous medications. 

## 2010-07-06 ENCOUNTER — Telehealth: Payer: Self-pay | Admitting: *Deleted

## 2010-07-06 MED ORDER — IRON DEXTRAN 50 MG/ML IJ SOLN: INTRAMUSCULAR | Status: DC

## 2010-07-06 NOTE — Telephone Encounter (Signed)
Per Dr. Juanda Chance patient needs to be scheduled at [facility] for IV Infed. Orders need to be faxed to Dr. Donata Duff at [phone]. Infed 25 mg IV test dose over 15 minutes then  Observe for  1 hour. If no reaction, give Infed 1000 mg IV over 4-5 hours. Pre med- Tylenol 2 po and Benadryl 25 mg po. Faxed orders to Dr. Mora Appl.

## 2010-07-06 NOTE — Telephone Encounter (Signed)
No answer or identifier on message machine. No message left for patient.

## 2010-07-07 ENCOUNTER — Telehealth: Payer: Self-pay | Admitting: *Deleted

## 2010-07-07 NOTE — Telephone Encounter (Signed)
Spoke with Ida Rogue at  [facility] re: Infed infusion for patient. She states they can give the Infed with our orders. She does not need to have an MD from [place]. Faxed her the orders for Infed, endo report, capsule endo report and hospital d/c on patient. She will call patient and schedule IV Infed infusion for next week.

## 2010-07-10 ENCOUNTER — Encounter: Payer: Self-pay | Admitting: Internal Medicine

## 2010-07-13 ENCOUNTER — Encounter: Payer: Self-pay | Admitting: Internal Medicine

## 2010-07-24 ENCOUNTER — Encounter: Payer: Self-pay | Admitting: Internal Medicine

## 2010-07-24 ENCOUNTER — Other Ambulatory Visit (INDEPENDENT_AMBULATORY_CARE_PROVIDER_SITE_OTHER): Payer: Medicare Other

## 2010-07-24 ENCOUNTER — Ambulatory Visit (INDEPENDENT_AMBULATORY_CARE_PROVIDER_SITE_OTHER): Payer: Medicare Other | Admitting: Internal Medicine

## 2010-07-24 DIAGNOSIS — D689 Coagulation defect, unspecified: Secondary | ICD-10-CM

## 2010-07-24 DIAGNOSIS — D509 Iron deficiency anemia, unspecified: Secondary | ICD-10-CM

## 2010-07-24 DIAGNOSIS — R195 Other fecal abnormalities: Secondary | ICD-10-CM

## 2010-07-24 LAB — CBC WITH DIFFERENTIAL/PLATELET
Basophils Absolute: 0 10*3/uL (ref 0.0–0.1)
Eosinophils Absolute: 0.1 10*3/uL (ref 0.0–0.7)
HCT: 34 % — ABNORMAL LOW (ref 39.0–52.0)
Lymphs Abs: 1.8 10*3/uL (ref 0.7–4.0)
MCHC: 33 g/dL (ref 30.0–36.0)
MCV: 76.7 fl — ABNORMAL LOW (ref 78.0–100.0)
Monocytes Absolute: 1 10*3/uL (ref 0.1–1.0)
Platelets: 248 10*3/uL (ref 150.0–400.0)
RDW: 22.5 % — ABNORMAL HIGH (ref 11.5–14.6)

## 2010-07-24 NOTE — Progress Notes (Signed)
[name] November 02, 1945 MRN [id number]        History of Present Illness:  This is a 65 year old white male with the AV malformation of the small bowel on capsule endoscopy in June 2012 and an abnormal-appearing cecal pouchsuggestive of a tumor. But the colonoscopy  showed normal terminal ileum and large ileocecal valve . There were a few superficial erosions on the top of the valve. Biopsies showed  chronic active inflammation with multi nucleated cells but no granulomas. Patient denies any symptoms of Crohn's disease such as abdominal pain, diarrhea or rectal bleeding. He also  had a hyperplastic polyp . He has received iron transfusion at [facility] several weeks ago. We will be rechecking his blood count today. Upper endoscopy in June 2012 showed a distal esophageal stricture.   Past Medical History  Diagnosis Date  . Angina pectoris, crescendo     CABG 1993, PCI of SVG to OM Jan 2012 (Drug-eluting stent x 2)  . History of nephrolithiasis   . Stricture and stenosis of esophagus   . Coronary artery disease   . Degeneration of lumbar or lumbosacral intervertebral disc   . Hyperlipidemia   . Hypertension   . Anemia   . Blood transfusion   . GERD (gastroesophageal reflux disease)   . Chronic kidney disease     KIDNEY STONES  . Myocardial infarction   . Hyperplastic colon polyp    Past Surgical History  Procedure Date  . Coronary artery bypass graft 1993  . Lithotripsy   . Angioplasty   . Colonoscopy     reports that he quit smoking about 23 years ago. He has never used smokeless tobacco. He reports that he does not drink alcohol or use illicit drugs. family history includes Coronary artery disease in his mother; Heart disease in his brother, mother, and sister; Kidney cancer in his mother; and Lung cancer in his brother and sister.  There is no history of Colon cancer. No Known Allergies      Review of Systems: He denies dysphagia, odynophagia, chest pain shortness of  breath or blood in stools  The remainder of the 10  point ROS is negative except as outlined in H&P   Physical Exam: General appearance  Well developed in no distress Eyes- non icteric HEENT nontraumatic, normocephalic Mouth no lesions, tongue papillated, no cheilosis Neck supple without adenopathy, thyroid not enlarged, no carotid bruits, no JVD Lungs Clear to auscultation bilaterally Cor normal S1 normal S2, regular rhythm , no murmur,  quiet precordium Abdomen soft nontender abdomen with normoactive bowel sounds  Brown Hemoccult-positive stool Rectal: Extremities no pedal edema Skin no lesions Neurological alert and oriented x3 Psychological normal mood and  affect  Assessment and Plan:  Chronic low-grade GI blood loss from AV malformations. No inflammatory artery also colitis at the ileocecal valve of questionable clinical significance. I had a long discussion about the fact that he has ongoing low-grade blood loss which will have to be closely monitored to prevent severe anemia. We will recheck his blood count today and most likely every 4 weeks at [facility]. He  is likely to require iron transfusions several times a year. He will take iron supplements orally as well. He will have to continue on his Plavix and aspirin as well as Prilosec 20 mg daily   07/24/2010 [name]

## 2010-07-24 NOTE — Patient Instructions (Signed)
Your physician has requested that you go to the basement for the following lab work before leaving today: CBC  

## 2010-07-28 ENCOUNTER — Telehealth: Payer: Self-pay | Admitting: *Deleted

## 2010-07-28 NOTE — Telephone Encounter (Signed)
Left a message for patient to call

## 2010-07-28 NOTE — Telephone Encounter (Signed)
Message copied by Daphine Deutscher on Fri Jul 28, 2010  8:39 AM ------      Message from: Hart Carwin      Created: Tue Jul 25, 2010 10:18 PM       Please call pt  with improvement in his H/H, Continue Iron pills. Please arrange for him to have CBC in 1 month in [facility] and from there on monthly a send [place] the results.

## 2010-07-28 NOTE — Telephone Encounter (Signed)
Spoke with patient and gave him the results. He would like labs done at [facility] a message at the outpatient department there to call me.

## 2010-08-01 NOTE — Telephone Encounter (Signed)
Spoke with Prudy Feeler at Outpatient area in [facility]. Order faxed to [phone] for monthly labs to begin on 08/25/10 and results to be faxed to Dr. Juanda Chance. Patient may come at any time per Tanielle. Patient's wife notified of above.

## 2010-08-29 ENCOUNTER — Telehealth: Payer: Self-pay | Admitting: *Deleted

## 2010-08-29 NOTE — Telephone Encounter (Signed)
Message copied by Daphine Deutscher on Tue Aug 29, 2010  4:08 PM ------      Message from: Daphine Deutscher      Created: Tue Aug 01, 2010  3:43 PM       Did patient go to [facility] for CBC(monthly lab DB)

## 2010-08-29 NOTE — Telephone Encounter (Signed)
Patient states he forgot to get his labs on 08/25/10 but he will go tomorrow and get them

## 2010-09-21 ENCOUNTER — Encounter: Payer: Self-pay | Admitting: Cardiology

## 2010-09-27 ENCOUNTER — Telehealth: Payer: Self-pay | Admitting: *Deleted

## 2010-09-27 NOTE — Telephone Encounter (Signed)
Message copied by Daphine Deutscher on Wed Sep 27, 2010  8:44 AM ------      Message from: Daphine Deutscher      Created: Tue Sep 12, 2010  8:46 AM       Remind patient To get CBC at [facility]. Mem. 09/29/10(DB)

## 2010-09-27 NOTE — Telephone Encounter (Signed)
Spoke with patient and he states he had labs on Monday and they were to fax these to Dr Juanda Chance.

## 2010-09-29 LAB — CBC
HCT: 36.4 — ABNORMAL LOW
HCT: 38.1 — ABNORMAL LOW
HCT: 43.1
Hemoglobin: 12.6 — ABNORMAL LOW
Hemoglobin: 12.8 — ABNORMAL LOW
Hemoglobin: 14.7
MCHC: 33.5
MCHC: 34.1
MCV: 92.6
RDW: 12.7
RDW: 13.2
WBC: 6.1

## 2010-09-29 LAB — BASIC METABOLIC PANEL
GFR calc Af Amer: >60
GFR calc non Af Amer: 56 — ABNORMAL LOW
Potassium: 4.1
Sodium: 141

## 2010-09-29 LAB — POCT I-STAT, CHEM 8
BUN: 20
Calcium, Ion: 1.21
Chloride: 108
Creatinine, Ser: 1.5
Glucose, Bld: 106 — ABNORMAL HIGH
HCT: 43
Hemoglobin: 14.6
Potassium: 4.6
Sodium: 142
TCO2: 27

## 2010-09-29 LAB — POCT CARDIAC MARKERS
CKMB, poc: 1.5
CKMB, poc: <1 — ABNORMAL LOW
Myoglobin, poc: 72
Myoglobin, poc: 83.9
Troponin i, poc: <0.05
Troponin i, poc: <0.05

## 2010-09-29 LAB — DIFFERENTIAL
Basophils Absolute: 0
Basophils Relative: 0
Eosinophils Absolute: 0.1
Eosinophils Relative: 2
Monocytes Absolute: 0.5

## 2010-09-29 LAB — CARDIAC PANEL(CRET KIN+CKTOT+MB+TROPI)
CK, MB: 2.6
Total CK: 57
Troponin I: 0.12 — ABNORMAL HIGH

## 2010-09-29 LAB — COMPREHENSIVE METABOLIC PANEL
BUN: 16
Calcium: 8.5
Glucose, Bld: 163 — ABNORMAL HIGH
Sodium: 139
Total Protein: 5.5 — ABNORMAL LOW

## 2010-09-29 LAB — HEPARIN LEVEL (UNFRACTIONATED)
Heparin Unfractionated: 0.82 — ABNORMAL HIGH
Heparin Unfractionated: 0.86 — ABNORMAL HIGH
Heparin Unfractionated: 1.09 — ABNORMAL HIGH

## 2010-09-29 LAB — CK TOTAL AND CKMB (NOT AT ARMC)
CK, MB: 1.8
Relative Index: INVALID
Total CK: 63

## 2010-09-29 LAB — TROPONIN I: Troponin I: 0.06

## 2010-10-03 LAB — CBC
HCT: 37.6 — ABNORMAL LOW
Hemoglobin: 13
MCV: 92.5
Platelets: 197
WBC: 6.1

## 2010-10-03 LAB — BASIC METABOLIC PANEL
BUN: 18
CO2: 22
Chloride: 105
GFR calc non Af Amer: 50 — ABNORMAL LOW
Glucose, Bld: 108 — ABNORMAL HIGH
Glucose, Bld: 128 — ABNORMAL HIGH
Potassium: 3.9
Potassium: 4.3
Sodium: 133 — ABNORMAL LOW
Sodium: 135

## 2010-10-31 ENCOUNTER — Telehealth: Payer: Self-pay | Admitting: *Deleted

## 2010-10-31 NOTE — Telephone Encounter (Signed)
Called and left a message for patient to call me and let me know if he had his labs drawn at [facility] this month.

## 2010-11-01 NOTE — Telephone Encounter (Signed)
Left a message for patient to call me. 

## 2010-11-02 NOTE — Telephone Encounter (Signed)
Left a message for patient to call me. 

## 2010-11-03 ENCOUNTER — Telehealth: Payer: Self-pay | Admitting: *Deleted

## 2010-11-03 ENCOUNTER — Encounter: Payer: Self-pay | Admitting: *Deleted

## 2010-11-03 NOTE — Telephone Encounter (Signed)
error 

## 2010-11-03 NOTE — Telephone Encounter (Signed)
Unable to reach patient to discuss if he has had his monthly labs at [facility]. Letter mailed to patient to call me to discuss.

## 2010-11-04 NOTE — Telephone Encounter (Signed)
Please send him a letter asking to obtain his CBC by his primary doctor and send [place] results.

## 2010-11-08 NOTE — Telephone Encounter (Signed)
Left a message for patient to call me. 

## 2010-11-10 NOTE — Telephone Encounter (Signed)
Spoke with patient's wife and let her know that patient did not get his monthly lab work on October. She is going to speak with patient and get him to go have labs done again.

## 2010-11-27 NOTE — Telephone Encounter (Signed)
I have sent a letter to patient's home address asking him to contact us regarding labs since we have so far been unsuccessful with phone calls.

## 2011-06-14 ENCOUNTER — Other Ambulatory Visit: Payer: Self-pay | Admitting: Cardiovascular Disease

## 2011-06-29 ENCOUNTER — Other Ambulatory Visit: Payer: Self-pay | Admitting: *Deleted

## 2011-06-29 MED ORDER — NITROGLYCERIN 0.4 MG SL SUBL: 0.4000 mg | SUBLINGUAL_TABLET | SUBLINGUAL | Status: DC | PRN

## 2011-07-17 ENCOUNTER — Other Ambulatory Visit: Payer: Self-pay | Admitting: Cardiovascular Disease

## 2011-07-17 ENCOUNTER — Other Ambulatory Visit: Payer: Self-pay | Admitting: *Deleted

## 2011-07-17 DIAGNOSIS — I259 Chronic ischemic heart disease, unspecified: Secondary | ICD-10-CM

## 2011-07-17 MED ORDER — CLOPIDOGREL BISULFATE 75 MG PO TABS: 75.0000 mg | ORAL_TABLET | Freq: Every day | ORAL | Status: DC

## 2011-07-17 NOTE — Telephone Encounter (Signed)
Pt requesting Plavix refill. He is due for follow up appt with Dr. Excell Seltzer. I called pt's wife and scheduled appt with Dr. Excell Seltzer for September 6,2013 at 8:45. Wife reports pt is doing well.  Plavix refilled for 90 day supply.

## 2011-09-07 ENCOUNTER — Ambulatory Visit (INDEPENDENT_AMBULATORY_CARE_PROVIDER_SITE_OTHER): Payer: Medicare Other | Admitting: Cardiovascular Disease

## 2011-09-07 ENCOUNTER — Encounter: Payer: Self-pay | Admitting: Cardiovascular Disease

## 2011-09-07 VITALS — BP 153/78 | HR 64 | Ht 66.0 in | Wt 185.0 lb

## 2011-09-07 DIAGNOSIS — I25810 Atherosclerosis of coronary artery bypass graft(s) without angina pectoris: Secondary | ICD-10-CM

## 2011-09-07 DIAGNOSIS — E785 Hyperlipidemia, unspecified: Secondary | ICD-10-CM

## 2011-09-07 DIAGNOSIS — I259 Chronic ischemic heart disease, unspecified: Secondary | ICD-10-CM

## 2011-09-07 DIAGNOSIS — I10 Essential (primary) hypertension: Secondary | ICD-10-CM

## 2011-09-07 MED ORDER — ATORVASTATIN CALCIUM 40 MG PO TABS: 40.0000 mg | ORAL_TABLET | Freq: Every day | ORAL | Status: DC

## 2011-09-07 MED ORDER — OMEPRAZOLE MAGNESIUM 20 MG PO TBEC: 1.0000 mg | DELAYED_RELEASE_TABLET | Freq: Two times a day (BID) | ORAL | Status: DC

## 2011-09-07 MED ORDER — NIACIN ER (ANTIHYPERLIPIDEMIC) 750 MG PO TBCR: 750.0000 mg | EXTENDED_RELEASE_TABLET | Freq: Every day | ORAL | Status: DC

## 2011-09-07 MED ORDER — CLOPIDOGREL BISULFATE 75 MG PO TABS: 75.0000 mg | ORAL_TABLET | Freq: Every day | ORAL | Status: DC

## 2011-09-07 MED ORDER — NITROGLYCERIN 0.4 MG SL SUBL: 0.4000 mg | SUBLINGUAL_TABLET | SUBLINGUAL | Status: DC | PRN

## 2011-09-07 MED ORDER — LISINOPRIL 20 MG PO TABS: 20.0000 mg | ORAL_TABLET | Freq: Every day | ORAL | Status: DC

## 2011-09-07 MED ORDER — METOPROLOL TARTRATE 50 MG PO TABS: 50.0000 mg | ORAL_TABLET | Freq: Two times a day (BID) | ORAL | Status: DC

## 2011-09-07 NOTE — Assessment & Plan Note (Signed)
The patient appears stable. He remains on dual antiplatelet therapy with aspirin and Plavix. I would be in favor of him staying on these medications long-term if he is able to tolerate. It sounds like his GI bleeding has been under reasonable control. He remains on ferrous sulfate. He'll otherwise continue his current cardiac medications.

## 2011-09-07 NOTE — Assessment & Plan Note (Signed)
Blood pressure is elevated but he has run out of his medicines. He should resume lisinopril and metoprolol.

## 2011-09-07 NOTE — Assessment & Plan Note (Signed)
Last lipids from 2012 were reviewed. They have been at goal. He is on atorvastatin 40 mg and niacin 750 mg daily.

## 2011-09-07 NOTE — Progress Notes (Signed)
   HPI:  66 year old gentleman presenting for followup evaluation. The patient has coronary artery disease with history of CABG. He had a non-ST elevation infarction in 2012 and required stenting of the saphenous vein graft obtuse marginal. 2 drug-eluting stent platform for use. He presents today for followup evaluation. He's had issues related to chronic GI blood loss from arteriovenous malformations. He's followed by Dr. Juanda Chance.  All the patient has been doing well. He's only had one episode of angina in the past several months. He took nitroglycerin with relief. He's had no regular exertional symptoms. He specifically denies exertional chest pain or pressure. He denies edema, orthopnea, or PND. He's had no palpitations, lightheadedness, or syncope. He stays busy at home and he and his wife have adopted a 48-year-old boy.  Outpatient Encounter Prescriptions as of 09/07/2011  Medication Sig Dispense Refill  . aspirin 81 MG tablet Take 81 mg by mouth daily.       Marland Kitchen atorvastatin (LIPITOR) 40 MG tablet Take 40 mg by mouth daily.      . clopidogrel (PLAVIX) 75 MG tablet Take 1 tablet (75 mg total) by mouth daily.  90 tablet  3  . ferrous sulfate 325 (65 FE) MG tablet Take 325 mg by mouth as directed.      Marland Kitchen lisinopril (PRINIVIL,ZESTRIL) 20 MG tablet TAKE ONE TABLET BY MOUTH EVERY DAY  30 tablet  6  . metoprolol (LOPRESSOR) 50 MG tablet Take 1 tablet (50 mg total) by mouth 2 (two) times daily.  180 tablet  3  . niacin (NIASPAN) 750 MG CR tablet Take 750 mg by mouth at bedtime.      . nitroGLYCERIN (NITROSTAT) 0.4 MG SL tablet Place 1 tablet (0.4 mg total) under the tongue every 5 (five) minutes as needed.  25 tablet  3  . omeprazole (PRILOSEC OTC) 20 MG tablet Take 1 tablet (20 mg total) by mouth 2 (two) times daily.  180 tablet  3  . DISCONTD: rosuvastatin (CRESTOR) 20 MG tablet Take 20 mg by mouth daily.          No Known Allergies  Past Medical History  Diagnosis Date  . Angina pectoris, crescendo      CABG 1993, PCI of SVG to OM Jan 2012 (Drug-eluting stent x 2)  . History of nephrolithiasis   . Stricture and stenosis of esophagus   . Coronary artery disease   . Degeneration of lumbar or lumbosacral intervertebral disc   . Hyperlipidemia   . Hypertension   . Anemia   . Blood transfusion   . GERD (gastroesophageal reflux disease)   . Chronic kidney disease     KIDNEY STONES  . Myocardial infarction   . Hyperplastic colon polyp     ROS: Negative except as per HPI  BP 153/78  Pulse 64  Ht 5\' 6"  (1.676 m)  Wt 83.915 kg (185 lb)  BMI 29.86 kg/m2  PHYSICAL EXAM: Pt is alert and oriented, pleasant elderly male in NAD HEENT: normal Neck: JVP - normal, carotids 2+= without bruits Lungs: CTA bilaterally CV: RRR without murmur or gallop Abd: soft, NT, Positive BS, obese Ext: no C/C/E, distal pulses intact and equal Skin: warm/dry no rash  EKG:  Normal sinus rhythm 66 beats per minute, age indeterminate inferior infarct.  ASSESSMENT AND PLAN:

## 2011-09-07 NOTE — Patient Instructions (Addendum)
Your physician recommends that you continue on your current medications as directed. Please refer to the Current Medication list given to you today.  Your physician wants you to follow-up in: 1 year with Dr. Cooper.  You will receive a reminder letter in the mail two months in advance. If you don't receive a letter, please call our office to schedule the follow-up appointment.   

## 2012-04-01 ENCOUNTER — Encounter: Payer: Self-pay | Admitting: Cardiovascular Disease

## 2012-09-03 ENCOUNTER — Other Ambulatory Visit: Payer: Self-pay | Admitting: Cardiovascular Disease

## 2012-09-09 ENCOUNTER — Ambulatory Visit (INDEPENDENT_AMBULATORY_CARE_PROVIDER_SITE_OTHER): Payer: Medicare Other | Admitting: Cardiovascular Disease

## 2012-09-09 ENCOUNTER — Encounter: Payer: Self-pay | Admitting: Cardiovascular Disease

## 2012-09-09 VITALS — BP 146/82 | HR 52 | Ht 66.0 in | Wt 186.0 lb

## 2012-09-09 DIAGNOSIS — R609 Edema, unspecified: Secondary | ICD-10-CM

## 2012-09-09 DIAGNOSIS — I2510 Atherosclerotic heart disease of native coronary artery without angina pectoris: Secondary | ICD-10-CM

## 2012-09-09 MED ORDER — FUROSEMIDE 20 MG PO TABS: ORAL_TABLET | ORAL | Status: DC

## 2012-09-09 MED ORDER — PANTOPRAZOLE SODIUM 40 MG PO TBEC: 40.0000 mg | DELAYED_RELEASE_TABLET | Freq: Every day | ORAL | Status: DC

## 2012-09-09 NOTE — Progress Notes (Signed)
HPI:  67 year old gentleman presenting for followup evaluation. The patient has coronary artery disease with history of CABG. He underwent PCI of the saphenous vein graft obtuse marginal in 2012 when he presented with a non-ST segment elevation infarction. His lab work is been followed by his primary care physician. The patient has had some problems with chronic GI blood loss secondary to arteriovenous malformations. He's been maintained on oral iron supplementation.  The patient is doing well. He's had one episode of chest discomfort requiring nitroglycerin since I saw him last one year ago. He does not recall what he was doing. He stays physically active. He and his wife have adopted a 25-year-old who is now in kindergarten. He denies exertional chest pain or pressure, dyspnea, or edema. He had recent lab work done by his primary care physician and was told that everything was in good range.  Outpatient Encounter Prescriptions as of 09/09/2012  Medication Sig Dispense Refill  . aspirin 81 MG tablet Take 81 mg by mouth daily.       Marland Kitchen atorvastatin (LIPITOR) 40 MG tablet Take 1 tablet (40 mg total) by mouth daily.  90 tablet  3  . clopidogrel (PLAVIX) 75 MG tablet Take 1 tablet (75 mg total) by mouth daily.  90 tablet  3  . ferrous sulfate 325 (65 FE) MG tablet Take 325 mg by mouth as directed.      Marland Kitchen lisinopril (PRINIVIL,ZESTRIL) 20 MG tablet TAKE ONE TABLET BY MOUTH EVERY DAY  90 tablet  0  . metoprolol (LOPRESSOR) 50 MG tablet Take 1 tablet (50 mg total) by mouth 2 (two) times daily.  180 tablet  3  . niacin (NIASPAN) 750 MG CR tablet Take 1 tablet (750 mg total) by mouth at bedtime.  90 tablet  3  . nitroGLYCERIN (NITROSTAT) 0.4 MG SL tablet Place 1 tablet (0.4 mg total) under the tongue every 5 (five) minutes as needed.  25 tablet  11  . omeprazole (PRILOSEC OTC) 20 MG tablet Take 1 tablet (20 mg total) by mouth 2 (two) times daily.  180 tablet  3   No facility-administered encounter medications  on file as of 09/09/2012.    No Known Allergies  Past Medical History  Diagnosis Date  . Angina pectoris, crescendo     CABG 1993, PCI of SVG to OM Jan 2012 (Drug-eluting stent x 2)  . History of nephrolithiasis   . Stricture and stenosis of esophagus   . Coronary artery disease   . Degeneration of lumbar or lumbosacral intervertebral disc   . Hyperlipidemia   . Hypertension   . Anemia   . Blood transfusion   . GERD (gastroesophageal reflux disease)   . Chronic kidney disease     KIDNEY STONES  . Myocardial infarction   . Hyperplastic colon polyp     ROS: Positive for left foot pain, also positive for swelling that he experiences in his lateral chest and axillary region, otherwise negative except as per HPI  BP 146/82  Ht 5\' 6"  (1.676 m)  Wt 84.369 kg (186 lb)  BMI 30.04 kg/m2  SpO2 98%  PHYSICAL EXAM: Pt is alert and oriented, NAD HEENT: normal Neck: JVP - normal, carotids 2+= without bruits Lungs: CTA bilaterally CV: RRR without murmur or gallop Abd: soft, NT, Positive BS, no hepatomegaly Ext: no C/C/E, distal pulses intact and equal Skin: warm/dry no rash  EKG:  Sinus bradycardia 52 beats per minute, age indeterminate inferior infarct, nonspecific ST abnormality.  ASSESSMENT  AND PLAN: 1. Coronary artery disease status post CABG and PCI. The patient is stable without significant anginal symptoms. He will remain on his current medical therapy which includes aspirin and Plavix which I think he should continue long-term. He will followup in one year.  2. Hypertension. Blood pressure control is reasonable on a combination of lisinopril and metoprolol.  3. Hyperlipidemia. Lipids are followed by his primary care physician.  We asked the patient to discontinue omeprazole because of interaction with Plavix. He will be started on Protonix 40 mg daily. He was also written for furosemide 20 mg to be used as needed for swelling. He will followup in one year unless problems  arise in the interim.  Tonny Bollman 09/09/2012 9:10 AM

## 2012-09-09 NOTE — Patient Instructions (Signed)
Your physician has recommended you make the following change in your medication:  1) Stop prilosec. 2) Start protonix (pantoprazole) 40 mg one tablet by mouth once daily. 3) Start lasix (furosemide) 20 mg one tablet by mouth daily as needed for swelling.  Your physician wants you to follow-up in: 1 year with Dr. Excell Seltzer. You will receive a reminder letter in the mail two months in advance. If you don't receive a letter, please call our office to schedule the follow-up appointment.

## 2012-09-22 ENCOUNTER — Inpatient Hospital Stay (HOSPITAL_COMMUNITY)
Admission: EM | Admit: 2012-09-22 | Discharge: 2012-09-24 | DRG: 251 | Disposition: A | Discharge status: Home / Self Care | Payer: Medicare Other | Attending: Cardiology | Admitting: Cardiology

## 2012-09-22 ENCOUNTER — Encounter (HOSPITAL_COMMUNITY): Payer: Self-pay | Admitting: *Deleted

## 2012-09-22 ENCOUNTER — Telehealth: Payer: Self-pay

## 2012-09-22 ENCOUNTER — Emergency Department (HOSPITAL_COMMUNITY): Payer: Medicare Other

## 2012-09-22 DIAGNOSIS — K219 Gastro-esophageal reflux disease without esophagitis: Secondary | ICD-10-CM | POA: Diagnosis present

## 2012-09-22 DIAGNOSIS — Z8051 Family history of malignant neoplasm of kidney: Secondary | ICD-10-CM

## 2012-09-22 DIAGNOSIS — Z801 Family history of malignant neoplasm of trachea, bronchus and lung: Secondary | ICD-10-CM

## 2012-09-22 DIAGNOSIS — Z9861 Coronary angioplasty status: Secondary | ICD-10-CM

## 2012-09-22 DIAGNOSIS — I200 Unstable angina: Secondary | ICD-10-CM

## 2012-09-22 DIAGNOSIS — I10 Essential (primary) hypertension: Secondary | ICD-10-CM

## 2012-09-22 DIAGNOSIS — T82897A Other specified complication of cardiac prosthetic devices, implants and grafts, initial encounter: Secondary | ICD-10-CM | POA: Diagnosis present

## 2012-09-22 DIAGNOSIS — Z79899 Other long term (current) drug therapy: Secondary | ICD-10-CM

## 2012-09-22 DIAGNOSIS — Z7982 Long term (current) use of aspirin: Secondary | ICD-10-CM

## 2012-09-22 DIAGNOSIS — M5137 Other intervertebral disc degeneration, lumbosacral region: Secondary | ICD-10-CM | POA: Diagnosis present

## 2012-09-22 DIAGNOSIS — I2582 Chronic total occlusion of coronary artery: Secondary | ICD-10-CM | POA: Diagnosis present

## 2012-09-22 DIAGNOSIS — E785 Hyperlipidemia, unspecified: Secondary | ICD-10-CM | POA: Diagnosis present

## 2012-09-22 DIAGNOSIS — Y832 Surgical operation with anastomosis, bypass or graft as the cause of abnormal reaction of the patient, or of later complication, without mention of misadventure at the time of the procedure: Secondary | ICD-10-CM | POA: Diagnosis present

## 2012-09-22 DIAGNOSIS — I2510 Atherosclerotic heart disease of native coronary artery without angina pectoris: Secondary | ICD-10-CM | POA: Diagnosis present

## 2012-09-22 DIAGNOSIS — I25810 Atherosclerosis of coronary artery bypass graft(s) without angina pectoris: Principal | ICD-10-CM | POA: Diagnosis present

## 2012-09-22 DIAGNOSIS — Z7902 Long term (current) use of antithrombotics/antiplatelets: Secondary | ICD-10-CM

## 2012-09-22 DIAGNOSIS — Z8249 Family history of ischemic heart disease and other diseases of the circulatory system: Secondary | ICD-10-CM

## 2012-09-22 DIAGNOSIS — N183 Chronic kidney disease, stage 3 unspecified: Secondary | ICD-10-CM | POA: Diagnosis present

## 2012-09-22 DIAGNOSIS — Z87442 Personal history of urinary calculi: Secondary | ICD-10-CM

## 2012-09-22 DIAGNOSIS — M51379 Other intervertebral disc degeneration, lumbosacral region without mention of lumbar back pain or lower extremity pain: Secondary | ICD-10-CM | POA: Diagnosis present

## 2012-09-22 DIAGNOSIS — I129 Hypertensive chronic kidney disease with stage 1 through stage 4 chronic kidney disease, or unspecified chronic kidney disease: Secondary | ICD-10-CM | POA: Diagnosis present

## 2012-09-22 DIAGNOSIS — Z87891 Personal history of nicotine dependence: Secondary | ICD-10-CM

## 2012-09-22 DIAGNOSIS — I498 Other specified cardiac arrhythmias: Secondary | ICD-10-CM | POA: Diagnosis not present

## 2012-09-22 HISTORY — DX: Chronic kidney disease, stage 3 unspecified: N18.30

## 2012-09-22 HISTORY — DX: Chronic kidney disease, stage 3 (moderate): N18.3

## 2012-09-22 HISTORY — DX: Atherosclerotic heart disease of native coronary artery without angina pectoris: I25.10

## 2012-09-22 LAB — CBC WITH DIFFERENTIAL/PLATELET
Basophils Absolute: 0 10*3/uL (ref 0.0–0.1)
Eosinophils Absolute: 0.1 10*3/uL (ref 0.0–0.7)
Eosinophils Relative: 2 % (ref 0–5)
Lymphs Abs: 1.6 10*3/uL (ref 0.7–4.0)
MCH: 32.8 pg (ref 26.0–34.0)
MCV: 89.7 fL (ref 78.0–100.0)
Neutrophils Relative %: 61 % (ref 43–77)
Platelets: 137 10*3/uL — ABNORMAL LOW (ref 150–400)
RBC: 4.67 MIL/uL (ref 4.22–5.81)
RDW: 12.5 % (ref 11.5–15.5)
WBC: 6.2 10*3/uL (ref 4.0–10.5)

## 2012-09-22 LAB — BASIC METABOLIC PANEL
BUN: 16 mg/dL (ref 6–23)
CO2: 25 mEq/L (ref 19–32)
Calcium: 9.1 mg/dL (ref 8.4–10.5)
Creatinine, Ser: 1.4 mg/dL — ABNORMAL HIGH (ref 0.50–1.35)
GFR calc Af Amer: 59 mL/min — ABNORMAL LOW (ref >90)
GFR calc non Af Amer: 51 mL/min — ABNORMAL LOW (ref >90)
Glucose, Bld: 198 mg/dL — ABNORMAL HIGH (ref 70–99)
Sodium: 137 mEq/L (ref 135–145)

## 2012-09-22 LAB — POCT I-STAT TROPONIN I: Troponin i, poc: 0.01 ng/mL (ref 0.00–0.08)

## 2012-09-22 LAB — TROPONIN I: Troponin I: <0.3 ng/mL (ref <0.30)

## 2012-09-22 LAB — PRO B NATRIURETIC PEPTIDE: Pro B Natriuretic peptide (BNP): 701.5 pg/mL — ABNORMAL HIGH (ref 0–125)

## 2012-09-22 MED ORDER — CLOPIDOGREL BISULFATE 75 MG PO TABS
75.0000 mg | ORAL_TABLET | Freq: Every day | ORAL | Status: DC
Start: 2012-09-23 — End: 2012-09-24
  Administered 2012-09-23 – 2012-09-24 (×2): 75 mg via ORAL
  Filled 2012-09-22 (×2): qty 1

## 2012-09-22 MED ORDER — SODIUM CHLORIDE 0.9 % IJ SOLN: 3.0000 mL | INTRAMUSCULAR | Status: DC | PRN

## 2012-09-22 MED ORDER — FERROUS SULFATE 325 (65 FE) MG PO TABS
325.0000 mg | ORAL_TABLET | Freq: Every day | ORAL | Status: DC
Start: 2012-09-23 — End: 2012-09-24
  Administered 2012-09-24: 325 mg via ORAL
  Filled 2012-09-22 (×3): qty 1

## 2012-09-22 MED ORDER — SODIUM CHLORIDE 0.9 % IV SOLN
1.0000 mL/kg/h | INTRAVENOUS | Status: DC
  Administered 2012-09-23: 1 mL/kg/h via INTRAVENOUS

## 2012-09-22 MED ORDER — NIACIN ER (ANTIHYPERLIPIDEMIC) 750 MG PO TBCR
750.0000 mg | EXTENDED_RELEASE_TABLET | Freq: Every day | ORAL | Status: DC
Start: 2012-09-22 — End: 2012-09-22

## 2012-09-22 MED ORDER — SODIUM CHLORIDE 0.9 % IJ SOLN: 3.0000 mL | Freq: Two times a day (BID) | INTRAMUSCULAR | Status: DC

## 2012-09-22 MED ORDER — ASPIRIN 81 MG PO TABS: 81.0000 mg | ORAL_TABLET | Freq: Every day | ORAL | Status: DC

## 2012-09-22 MED ORDER — PANTOPRAZOLE SODIUM 40 MG PO TBEC
40.0000 mg | DELAYED_RELEASE_TABLET | Freq: Every day | ORAL | Status: DC
  Administered 2012-09-23: 16:00:00 40 mg via ORAL
  Filled 2012-09-22: qty 1

## 2012-09-22 MED ORDER — ASPIRIN 81 MG PO CHEW
324.0000 mg | CHEWABLE_TABLET | ORAL | Status: AC
  Administered 2012-09-23: 324 mg via ORAL
  Filled 2012-09-22: qty 4

## 2012-09-22 MED ORDER — ACETAMINOPHEN 325 MG PO TABS: 650.0000 mg | ORAL_TABLET | ORAL | Status: DC | PRN

## 2012-09-22 MED ORDER — ASPIRIN 81 MG PO CHEW
324.0000 mg | CHEWABLE_TABLET | Freq: Once | ORAL | Status: AC
  Administered 2012-09-22: 324 mg via ORAL
  Filled 2012-09-22: qty 4

## 2012-09-22 MED ORDER — ASPIRIN 81 MG PO CHEW
81.0000 mg | CHEWABLE_TABLET | Freq: Every day | ORAL | Status: DC
  Administered 2012-09-24: 81 mg via ORAL
  Filled 2012-09-22: qty 1

## 2012-09-22 MED ORDER — NITROGLYCERIN 0.4 MG SL SUBL
0.4000 mg | SUBLINGUAL_TABLET | SUBLINGUAL | Status: DC | PRN
  Administered 2012-09-22: 0.4 mg via SUBLINGUAL

## 2012-09-22 MED ORDER — ATORVASTATIN CALCIUM 40 MG PO TABS
40.0000 mg | ORAL_TABLET | Freq: Every day | ORAL | Status: DC
  Administered 2012-09-22 – 2012-09-24 (×3): 40 mg via ORAL
  Filled 2012-09-22 (×4): qty 1

## 2012-09-22 MED ORDER — ONDANSETRON HCL 4 MG/2ML IJ SOLN: 4.0000 mg | Freq: Four times a day (QID) | INTRAMUSCULAR | Status: DC | PRN

## 2012-09-22 MED ORDER — METOPROLOL TARTRATE 50 MG PO TABS
50.0000 mg | ORAL_TABLET | Freq: Two times a day (BID) | ORAL | Status: DC
  Administered 2012-09-22 – 2012-09-24 (×4): 50 mg via ORAL
  Filled 2012-09-22 (×2): qty 2
  Filled 2012-09-22 (×4): qty 1
  Filled 2012-09-22: qty 2
  Filled 2012-09-22: qty 1

## 2012-09-22 MED ORDER — HEPARIN BOLUS VIA INFUSION
4000.0000 [IU] | Freq: Once | INTRAVENOUS | Status: AC
  Administered 2012-09-22: 4000 [IU] via INTRAVENOUS
  Filled 2012-09-22: qty 4000

## 2012-09-22 MED ORDER — NIACIN ER 500 MG PO CPCR
750.0000 mg | ORAL_CAPSULE | Freq: Every day | ORAL | Status: DC
  Administered 2012-09-22 – 2012-09-23 (×2): 750 mg via ORAL
  Filled 2012-09-22 (×5): qty 1

## 2012-09-22 MED ORDER — NITROGLYCERIN 0.4 MG SL SUBL: 0.4000 mg | SUBLINGUAL_TABLET | SUBLINGUAL | Status: DC | PRN

## 2012-09-22 MED ORDER — SODIUM CHLORIDE 0.9 % IV SOLN: 250.0000 mL | INTRAVENOUS | Status: DC | PRN

## 2012-09-22 MED ORDER — HEPARIN (PORCINE) IN NACL 100-0.45 UNIT/ML-% IJ SOLN
1100.0000 [IU]/h | INTRAMUSCULAR | Status: DC
  Administered 2012-09-22: 1100 [IU]/h via INTRAVENOUS
  Filled 2012-09-22: qty 250

## 2012-09-22 NOTE — Telephone Encounter (Signed)
Patient's wife called to inform us that this patient was going to the ER at the local hospital in [place]. He has been having chest pain all weekend, it got better, then went for a walk this morning and started having worse pain, she is awaiting EMS now. Spoke with the wife until EMS got there to add emotional support.

## 2012-09-22 NOTE — ED Notes (Signed)
Pt with hx of CABG to ED c/o chest pain since Friday.  Relieved by nitro and asa, though pt has taken neither today.  Pt originally tx himself for acid reflux, but the pain is now travelling from center of chest to both arms.  C/o sob and dizziness on ambulation.

## 2012-09-22 NOTE — ED Provider Notes (Signed)
CSN: [id number]     Arrival date & time 09/22/12  1152 History   First MD Initiated Contact with Patient 09/22/12 1300     Chief Complaint  Patient presents with  . Chest Pain   (Consider location/radiation/quality/duration/timing/severity/associated sxs/prior Treatment) Patient is a 67 y.o. male presenting with chest pain.  Chest Pain Pain location:  Substernal area Pain quality: burning   Pain radiates to:  L arm and R arm Pain severity:  Moderate Onset quality:  Gradual Duration:  2 days Timing:  Constant Progression:  Worsening Chronicity:  Recurrent Context comment:  Exertional Relieved by:  Nitroglycerin and rest Worsened by:  Exertion Associated symptoms: dizziness and shortness of breath   Associated symptoms: no abdominal pain, no fever, no nausea and not vomiting     Past Medical History  Diagnosis Date  . Angina pectoris, crescendo     CABG 1993, PCI of SVG to OM Jan 2012 (Drug-eluting stent x 2)  . History of nephrolithiasis   . Stricture and stenosis of esophagus   . Coronary artery disease   . Degeneration of lumbar or lumbosacral intervertebral disc   . Hyperlipidemia   . Hypertension   . Anemia   . Blood transfusion   . GERD (gastroesophageal reflux disease)   . Chronic kidney disease     KIDNEY STONES  . Myocardial infarction   . Hyperplastic colon polyp    Past Surgical History  Procedure Laterality Date  . Coronary artery bypass graft  1993  . Lithotripsy    . Angioplasty    . Colonoscopy     Family History  Problem Relation Age of Onset  . Coronary artery disease Mother     deceased.status post CABG  . Heart disease Mother   . Lung cancer Brother   . Lung cancer Sister   . Heart disease Brother   . Heart disease Sister   . Kidney cancer Mother   . Colon cancer Neg Hx    History  Substance Use Topics  . Smoking status: Former Smoker    Quit date: 01/06/1987  . Smokeless tobacco: Never Used  . Alcohol Use: No    Review of  Systems  Constitutional: Negative for fever.  HENT: Negative for congestion.   Respiratory: Positive for shortness of breath.   Cardiovascular: Positive for chest pain.  Gastrointestinal: Negative for nausea, vomiting, abdominal pain and diarrhea.  Neurological: Positive for dizziness.  All other systems reviewed and are negative.    Allergies  Review of patient's allergies indicates no known allergies.  Home Medications   Current Outpatient Rx  Name  Route  Sig  Dispense  Refill  . aspirin 81 MG tablet   Oral   Take 81 mg by mouth daily.          Marland Kitchen atorvastatin (LIPITOR) 40 MG tablet   Oral   Take 1 tablet (40 mg total) by mouth daily.   90 tablet   3   . clopidogrel (PLAVIX) 75 MG tablet   Oral   Take 1 tablet (75 mg total) by mouth daily.   90 tablet   3   . ferrous sulfate 325 (65 FE) MG tablet   Oral   Take 325 mg by mouth as directed.         . furosemide (LASIX) 20 MG tablet      Take one tablet by mouth daily as needed for swelling   30 tablet   3   . lisinopril (PRINIVIL,ZESTRIL) 20  MG tablet      TAKE ONE TABLET BY MOUTH EVERY DAY   90 tablet   0   . metoprolol (LOPRESSOR) 50 MG tablet   Oral   Take 1 tablet (50 mg total) by mouth 2 (two) times daily.   180 tablet   3   . niacin (NIASPAN) 750 MG CR tablet   Oral   Take 1 tablet (750 mg total) by mouth at bedtime.   90 tablet   3   . nitroGLYCERIN (NITROSTAT) 0.4 MG SL tablet   Sublingual   Place 1 tablet (0.4 mg total) under the tongue every 5 (five) minutes as needed.   25 tablet   11   . pantoprazole (PROTONIX) 40 MG tablet   Oral   Take 1 tablet (40 mg total) by mouth daily.   30 tablet   11    BP 144/87  Pulse 57  Temp(Src) 98.4 F (36.9 C) (Oral)  Resp 16  Ht 5\' 6"  (1.676 m)  Wt 186 lb 3.2 oz (84.46 kg)  BMI 30.07 kg/m2  SpO2 96% Physical Exam  Nursing note and vitals reviewed. Constitutional: He is oriented to person, place, and time. He appears well-developed  and well-nourished. No distress.  HENT:  Head: Normocephalic and atraumatic.  Mouth/Throat: Oropharynx is clear and moist.  Eyes: Conjunctivae are normal. Pupils are equal, round, and reactive to light. No scleral icterus.  Neck: Neck supple.  Cardiovascular: Normal rate, regular rhythm, normal heart sounds and intact distal pulses.   No murmur heard. Pulmonary/Chest: Effort normal and breath sounds normal. No stridor. No respiratory distress. He has no wheezes. He has no rales.  Abdominal: Soft. He exhibits no distension. There is no tenderness.  Musculoskeletal: Normal range of motion. He exhibits no edema.  Neurological: He is alert and oriented to person, place, and time.  Skin: Skin is warm and dry. No rash noted.  Psychiatric: He has a normal mood and affect. His behavior is normal.    ED Course  Procedures (including critical care time) Labs Review Labs Reviewed  PRO B NATRIURETIC PEPTIDE - Abnormal; Notable for the following:    Pro B Natriuretic peptide (BNP) 701.5 (*)    All other components within normal limits  BASIC METABOLIC PANEL - Abnormal; Notable for the following:    Glucose, Bld 198 (*)    Creatinine, Ser 1.40 (*)    GFR calc non Af Amer 51 (*)    GFR calc Af Amer 59 (*)    All other components within normal limits  CBC WITH DIFFERENTIAL - Abnormal; Notable for the following:    MCHC 36.5 (*)    Platelets 137 (*)    All other components within normal limits  POCT I-STAT TROPONIN I   Imaging Review Dg Chest 2 View  09/22/2012   *RADIOLOGY REPORT*  Clinical Data: Chest pain  CHEST - 2 VIEW  Comparison: 06/05/2010  Findings: Changes of median sternotomy for CABG.  Heart size, mediastinal contours, and hilar contours are normal. Coronary artery stent noted.  Pulmonary vascularity is normal.  The lungs are well expanded and clear and the trachea is midline.  The bones are unremarkable.  IMPRESSION: No acute cardiopulmonary disease.  Prior CABG and coronary artery  stent placement.   Original Report Authenticated By: Britta Mccreedy, M.D.   EKG - sinus rhythm, rate 62, LAD, nonspecific ST changes.  MDM   1. Unstable angina    Chest pain in setting of known coronary disease.  Initial ED workup reassuring.  Cardiology consulted.  Aspirin and nitro given.      Candyce Churn, MD 09/23/12 [phone]

## 2012-09-22 NOTE — H&P (Signed)
CARDIOLOGY ADMISSION NOTE  Patient ID: [name] MRN: [id number] DOB/AGE: 03-11-1945 67 y.o.  Admit date: 09/22/2012 Primary Physician   Benjiman Core, MD Primary Cardiologist   Dr.  Excell Seltzer Chief Complaint    Chest pain  HPI:  The patient presents for evaluation of chest pain. He has a history of coronary artery disease as described below. On Friday he began to have some burning chest discomfort. This was with activity. He took 2 aspirin. He lost his sublingual nitroglycerin. His pain eventually went away. However, he had this briefly and less severely on Saturday. He had some on Sunday. Today he walked down to the chicken which is a slight grade about 50 yards. When he came back up he had chest discomfort and was burning. Moderately severe. There was right arm discomfort. He was not nauseated but he did have shortness of breath.  He finally came to the emergency room. He said he has not had any of this discomfort at rest. He is not describing PND or orthopnea. Is not describing palpitations, presyncope or syncope. He says this is the same discomfort he has had previously.  Past Medical History  Diagnosis Date  . Angina pectoris, crescendo     CABG 1993, PCI of SVG to OM Jan 2012 (Drug-eluting stent x 2)  . History of nephrolithiasis   . Stricture and stenosis of esophagus   . Coronary artery disease   . Degeneration of lumbar or lumbosacral intervertebral disc   . Hyperlipidemia   . Hypertension   . Anemia   . Blood transfusion   . GERD (gastroesophageal reflux disease)   . Chronic kidney disease     KIDNEY STONES  . Myocardial infarction   . Hyperplastic colon polyp     Past Surgical History  Procedure Laterality Date  . Coronary artery bypass graft  1993  . Lithotripsy    . Angioplasty    . Colonoscopy      No Known Allergies No current facility-administered medications on file prior to encounter.   Current Outpatient Prescriptions on File Prior to Encounter    Medication Sig Dispense Refill  . aspirin 81 MG tablet Take 81 mg by mouth daily.       Marland Kitchen atorvastatin (LIPITOR) 40 MG tablet Take 1 tablet (40 mg total) by mouth daily.  90 tablet  3  . clopidogrel (PLAVIX) 75 MG tablet Take 1 tablet (75 mg total) by mouth daily.  90 tablet  3  . ferrous sulfate 325 (65 FE) MG tablet Take 325 mg by mouth as directed.      . metoprolol (LOPRESSOR) 50 MG tablet Take 1 tablet (50 mg total) by mouth 2 (two) times daily.  180 tablet  3  . niacin (NIASPAN) 750 MG CR tablet Take 1 tablet (750 mg total) by mouth at bedtime.  90 tablet  3  . pantoprazole (PROTONIX) 40 MG tablet Take 1 tablet (40 mg total) by mouth daily.  30 tablet  11   History   Social History  . Marital Status: Married    Spouse Name: N/A    Number of Children: N/A  . Years of Education: N/A   Occupational History  . retired     Arts administrator   Social History Main Topics  . Smoking status: Former Smoker    Quit date: 01/06/1987  . Smokeless tobacco: Never Used  . Alcohol Use: No  . Drug Use: No  . Sexual Activity: Not on file   Other Topics  Concern  . Not on file   Social History Narrative  . No narrative on file    Family History  Problem Relation Age of Onset  . Coronary artery disease Mother     deceased.status post CABG  . Heart disease Mother   . Lung cancer Brother   . Lung cancer Sister   . Heart disease Brother   . Heart disease Sister   . Kidney cancer Mother   . Colon cancer Neg Hx     ROS:  As stated in the HPI and negative for all other systems.  Physical Exam: Blood pressure 145/83, pulse 56, temperature 98.4 F (36.9 C), temperature source Oral, resp. rate 15, height 5\' 6"  (1.676 m), weight 186 lb 3.2 oz (84.46 kg), SpO2 98.00%.  GENERAL:  Well appearing HEENT:  Pupils equal round and reactive, fundi not visualized, oral mucosa unremarkable NECK:  No jugular venous distention, waveform within normal limits, carotid upstroke brisk and symmetric, no  bruits, no thyromegaly LYMPHATICS:  No cervical, inguinal adenopathy LUNGS:  Clear to auscultation bilaterally BACK:  No CVA tenderness CHEST:  Well healed sternotomy scar. HEART:  PMI not displaced or sustained,S1 and S2 within normal limits, no S3, no S4, no clicks, no rubs, no murmurs ABD:  Flat, positive bowel sounds normal in frequency in pitch, no bruits, no rebound, no guarding, no midline pulsatile mass, no hepatomegaly, no splenomegaly EXT:  2 plus pulses throughout, no edema, no cyanosis no clubbing SKIN:  No rashes no nodules NEURO:  Cranial nerves II through XII grossly intact, motor grossly intact throughout PSYCH:  Cognitively intact, oriented to person place and time  Labs: Lab Results  Component Value Date   BUN 16 09/22/2012   Lab Results  Component Value Date   CREATININE 1.40* 09/22/2012   Lab Results  Component Value Date   NA 137 09/22/2012   K 4.0 09/22/2012   CL 104 09/22/2012   CO2 25 09/22/2012    Lab Results  Component Value Date   WBC 6.2 09/22/2012   HGB 15.3 09/22/2012   HCT 41.9 09/22/2012   MCV 89.7 09/22/2012   PLT 137* 09/22/2012    Radiology:  CXR:  No acute cardiopulmonary disease. Prior CABG and coronary artery  stent placement.   EKG:  NSR:  Rate 72.  Axis within normal limits, intervals within normal limits, poor anterior R wave progression.    ASSESSMENT AND PLAN:    UNSTABLE ANGINA:  The patient has his classic unstable angina. He will need to be heparinized. He is on the board for cardiac catheterization tomorrow.  The patient understands that risks included but are not limited to stroke (1 in 1000), death (1 in 1000), kidney failure [usually temporary] (1 in 500), bleeding (1 in 200), allergic reaction [possibly serious] (1 in 200).  The patient understands and agrees to proceed.    DYSLIPIDEMIA:  An outpatient and continue the meds as listed. We should review the indication for niacin given recently.  HTN:  The blood pressure is  controlled. We will continue the meds as listed.   SignedRollene Rotunda 09/22/2012, 4:21 PM

## 2012-09-22 NOTE — Consult Note (Signed)
ANTICOAGULATION CONSULT NOTE - Initial Consult  Pharmacy Consult for Heparin Indication: chest pain/ACS  No Known Allergies  Patient Measurements: Height: 5\' 6"  (167.6 cm) Weight: 186 lb 3.2 oz (84.46 kg) IBW/kg (Calculated) : 63.8 Heparin Dosing Weight: 81kg  Vital Signs: Temp: 98.4 F (36.9 C) (09/22 1202) Temp src: Oral (09/22 1202) BP: 155/70 mmHg (09/22 1700) Pulse Rate: 53 (09/22 1700)  Labs:  Recent Labs  09/22/12 1216  HGB 15.3  HCT 41.9  PLT 137*  CREATININE 1.40*    Estimated Creatinine Clearance: 52.9 ml/min (by C-G formula based on Cr of 1.4).   Medical History: Past Medical History  Diagnosis Date  . Angina pectoris, crescendo     CABG 1993, PCI of SVG to OM Jan 2012 (Drug-eluting stent x 2)  . History of nephrolithiasis   . Stricture and stenosis of esophagus   . Degeneration of lumbar or lumbosacral intervertebral disc   . Hyperlipidemia   . Hypertension   . Anemia   . Blood transfusion   . GERD (gastroesophageal reflux disease)   . Hyperplastic colon polyp     Medications:  No anticoagulants pta  Assessment: 67yom with history of CAD s/p CABG (1993) presents to the ED with chest pain. First troponin is negative. He will begin IV heparin with plans for cath tomorrow.  Goal of Therapy:  Heparin level 0.3-0.7 units/ml Monitor platelets by anticoagulation protocol: Yes   Plan:  1) Heparin bolus 4000 units x 1 2) Heparin drip at 1100 units/hr 3) 6 hour heparin level 4) Daily heparin level and CBC  Fredrik Rigger 09/22/2012,6:47 PM

## 2012-09-22 NOTE — ED Notes (Signed)
Please call Wife Hermelinda Dellen) once pt gets a room @ [phone] (home) [phone] (cell)

## 2012-09-23 ENCOUNTER — Encounter (HOSPITAL_COMMUNITY)
Admission: EM | Disposition: A | Discharge status: Home / Self Care | Payer: Medicare Other | Source: Home / Self Care | Attending: Cardiology

## 2012-09-23 DIAGNOSIS — I25810 Atherosclerosis of coronary artery bypass graft(s) without angina pectoris: Secondary | ICD-10-CM

## 2012-09-23 HISTORY — PX: LEFT HEART CATHETERIZATION WITH CORONARY/GRAFT ANGIOGRAM: SHX5450

## 2012-09-23 HISTORY — PX: PERCUTANEOUS CORONARY INTERVENTION-BALLOON ONLY: SHX6014

## 2012-09-23 LAB — BASIC METABOLIC PANEL
BUN: 15 mg/dL (ref 6–23)
CO2: 25 mEq/L (ref 19–32)
Calcium: 8.7 mg/dL (ref 8.4–10.5)
GFR calc Af Amer: 55 mL/min — ABNORMAL LOW (ref >90)
GFR calc non Af Amer: 47 mL/min — ABNORMAL LOW (ref >90)
Glucose, Bld: 135 mg/dL — ABNORMAL HIGH (ref 70–99)
Potassium: 3.7 mEq/L (ref 3.5–5.1)
Sodium: 141 mEq/L (ref 135–145)

## 2012-09-23 LAB — CBC
HCT: 39.6 % (ref 39.0–52.0)
MCH: 32 pg (ref 26.0–34.0)
MCHC: 35.4 g/dL (ref 30.0–36.0)
MCV: 90.4 fL (ref 78.0–100.0)
Platelets: 120 10*3/uL — ABNORMAL LOW (ref 150–400)
RBC: 4.38 MIL/uL (ref 4.22–5.81)

## 2012-09-23 LAB — LIPID PANEL
HDL: 32 mg/dL — ABNORMAL LOW (ref >39)
LDL Cholesterol: 80 mg/dL (ref 0–99)
Total CHOL/HDL Ratio: 4.2 RATIO
Triglycerides: 111 mg/dL (ref <150)
VLDL: 22 mg/dL (ref 0–40)

## 2012-09-23 LAB — HEPARIN LEVEL (UNFRACTIONATED): Heparin Unfractionated: 0.67 IU/mL (ref 0.30–0.70)

## 2012-09-23 LAB — POCT ACTIVATED CLOTTING TIME
Activated Clotting Time: 160 seconds
Activated Clotting Time: 595 seconds

## 2012-09-23 LAB — TROPONIN I
Troponin I: <0.3 ng/mL (ref <0.30)
Troponin I: <0.3 ng/mL (ref <0.30)

## 2012-09-23 LAB — PROTIME-INR: Prothrombin Time: 13.9 seconds (ref 11.6–15.2)

## 2012-09-23 SURGERY — LEFT HEART CATHETERIZATION WITH CORONARY/GRAFT ANGIOGRAM
Anesthesia: LOCAL

## 2012-09-23 MED ORDER — LIDOCAINE HCL (PF) 1 % IJ SOLN
INTRAMUSCULAR | Status: AC
  Filled 2012-09-23: qty 30

## 2012-09-23 MED ORDER — SODIUM CHLORIDE 0.9 % IV SOLN
1.0000 mL/kg/h | INTRAVENOUS | Status: AC
  Administered 2012-09-23 (×2): 1 mL/kg/h via INTRAVENOUS

## 2012-09-23 MED ORDER — MIDAZOLAM HCL 2 MG/2ML IJ SOLN
INTRAMUSCULAR | Status: AC
  Filled 2012-09-23: qty 2

## 2012-09-23 MED ORDER — FENTANYL CITRATE 0.05 MG/ML IJ SOLN
INTRAMUSCULAR | Status: AC
  Filled 2012-09-23: qty 2

## 2012-09-23 MED ORDER — HEPARIN (PORCINE) IN NACL 2-0.9 UNIT/ML-% IJ SOLN
INTRAMUSCULAR | Status: AC
  Filled 2012-09-23: qty 1500

## 2012-09-23 MED ORDER — BIVALIRUDIN 250 MG IV SOLR
INTRAVENOUS | Status: AC
  Filled 2012-09-23: qty 250

## 2012-09-23 MED ORDER — ALUM & MAG HYDROXIDE-SIMETH 200-200-20 MG/5ML PO SUSP
30.0000 mL | ORAL | Status: DC | PRN
  Administered 2012-09-23 – 2012-09-24 (×2): 30 mL via ORAL
  Filled 2012-09-23 (×2): qty 30

## 2012-09-23 MED ORDER — NITROGLYCERIN 0.2 MG/ML ON CALL CATH LAB
INTRAVENOUS | Status: AC
  Filled 2012-09-23: qty 1

## 2012-09-23 NOTE — CV Procedure (Signed)
    Cardiac Cath Note  [name] [id number] 01-12-1945  Procedure: Left  Heart Cardiac Catheterization Note Indications: Unstable angina / left arm pain  Procedure Details Consent: Obtained Time Out: Verified patient identification, verified procedure, site/side was marked, verified correct patient position, special equipment/implants available, Radiology Safety Procedures followed,  medications/allergies/relevent history reviewed, required imaging and test results available.  Performed   Medications: Fentanyl: 50 mg IV  Versed:  2 mg IV  The right femoral artery was easily canulated using a modified Seldinger technique.  Hemodynamics:   LV pressure: 145/9 Aortic pressure: 141/65  Angiography   Left Main: moderate in size, mild  diffuse disease  Left anterior Descending: severely diseased diffuse disease and the occluded after giving off several small and severely diseased diags.    Left Circumflex: occluded  Right Coronary Artery: moderate and dominant.  The long stent has minimal luminal instent restenosis  LV Gram: not performed ( to save contrast) ,  AV crossed for pressures only    Large graft.  The proximal stent ( 3.0 x 23 Promus)  has a tight, eccentric 95% stenosis.  The distal SVG stent ( 3.5 x 23 Promus) is minimal restenosis.  SVG to  Acute marginal: chronically occluded.   LIMA to LAD:  Normal graft.  Anastomosis to the LAD is normal.  The diagonal  fills in a retrograde fashion.  The LAD provides collaterals to the LCx system.  Complications: No apparent complications Patient did tolerate procedure well.  Contrast used: 40 cc   Conclusions:   1. Severe native CAD. 2. Patent LIMA to LAD 3. Occluded SVG to Acute marginal  4. The SVG to OM has a tight stenosis in the proximal stent.  This lesion has been re-dilated in the past.  Will discuss with Dr. [place].  He may need restenting of this focal area of restenosis.   [name], [name]., MD,  [facility] 09/23/2012, 8:20 AM Office - [phone] Pager [phone]

## 2012-09-23 NOTE — Consult Note (Signed)
ANTICOAGULATION CONSULT NOTE   Pharmacy Consult for Heparin Indication: chest pain/ACS  No Known Allergies  Patient Measurements: Height: 5\' 6"  (167.6 cm) Weight: 186 lb 9.6 oz (84.641 kg) IBW/kg (Calculated) : 63.8 Heparin Dosing Weight: 81kg  Vital Signs: Temp: 98 F (36.7 C) (09/22 2108) Temp src: Oral (09/22 2108) BP: 190/95 mmHg (09/22 2108) Pulse Rate: 60 (09/22 2214)  Labs:  Recent Labs  09/22/12 1216 09/22/12 2202 09/23/12 0150  HGB 15.3  --   --   HCT 41.9  --   --   PLT 137*  --   --   HEPARINUNFRC  --   --  0.67  CREATININE 1.40*  --   --   TROPONINI  --  <0.30 <0.30    Estimated Creatinine Clearance: 52.9 ml/min (by C-G formula based on Cr of 1.4).   Medical History: Past Medical History  Diagnosis Date  . Angina pectoris, crescendo     CABG 1993, PCI of SVG to OM Jan 2012 (Drug-eluting stent x 2)  . History of nephrolithiasis   . Stricture and stenosis of esophagus   . Degeneration of lumbar or lumbosacral intervertebral disc   . Hyperlipidemia   . Hypertension   . Anemia   . Blood transfusion   . GERD (gastroesophageal reflux disease)   . Hyperplastic colon polyp     Medications:  No anticoagulants pta  Assessment: 67yom with history of CAD s/p CABG (1993) presents to the ED with chest pain. Troponin is negative X 2. He will begin IV heparin with plans for cath tomorrow. Initial heparin level is therapeutic.  Goal of Therapy:  Heparin level 0.3-0.7 units/ml Monitor platelets by anticoagulation protocol: Yes   Plan:  1) Cont heparin drip at 1100 units/hr 2)  F/u after cath   Neville Pauls [place] 09/23/2012,2:54 AM

## 2012-09-23 NOTE — CV Procedure (Signed)
   CARDIAC CATH NOTE  Name: [name] MRN: [id number] DOB: 12-Jan-1945  Procedure: PTCA  of the SVG to the OM  Indication: 67 yo WM with history of CAD s/p CABG in 1993. He is s/p extensive stenting of the RCA in 2007. He is s/p Stenting of the SVG to the OM in 2009 with DES (Promus) stents. In 01/2010 he had restenosis and had repeat stenting with Ion DES. He presents now with unstable angina class IV. Diagnostic cardiac cath showed patent LIMA to the LAD and patent native RCA. There was a focal 95% stenosis in the proximal SVG to the OM at the prior stent site.  Procedural Details: A 6 Fr sheath was exchanged  into the right femoral artery for the diagnostic sheath.  Weight-based bivalirudin was given for anticoagulation. Once a therapeutic ACT was achieved, a 6 French LCB guide catheter was inserted.  A filter coronary guidewire was used to cross the lesion and the filter deployed in the mid graft.  The lesion was then dilated with a 3.5 mm balloon.   Following PCI, there was 0% residual stenosis and TIMI-3 flow. The filter was retrieved without difficulty. Final angiography confirmed an excellent result. The patient tolerated the procedure well. There were no immediate procedural complications. Femoral hemostasis was achieved with Manual compression. The patient was transferred to the post catheterization recovery area for further monitoring.  Lesion Data: Vessel: SVG to OM Percent stenosis (pre): 95% TIMI-flow (pre):  2 Balloon angioplasty only Percent stenosis (post): 0% TIMI-flow (post): 3  Conclusions: Successful PTCA of the SVG to the OM for in stent restenosis.  Recommendations: continue dual antiplatelet therapy indefinitely.  Theron Arista [facility] 09/23/2012, 10:01 AM

## 2012-09-23 NOTE — Interval H&P Note (Signed)
History and Physical Interval Note:  09/23/2012 7:39 AM  [name]  has presented today for surgery, with the diagnosis of Chest pain  The various methods of treatment have been discussed with the patient and family. After consideration of risks, benefits and other options for treatment, the patient has consented to  Procedure(s): LEFT HEART CATHETERIZATION WITH CORONARY/GRAFT ANGIOGRAM (N/A) as a surgical intervention .  The patient's history has been reviewed, patient examined, no change in status, stable for surgery.  I have reviewed the patient's chart and labs.  Questions were answered to the patient's satisfaction.     Elyn Aquas.

## 2012-09-24 ENCOUNTER — Encounter (HOSPITAL_COMMUNITY): Payer: Self-pay | Admitting: Nurse Practitioner

## 2012-09-24 DIAGNOSIS — E785 Hyperlipidemia, unspecified: Secondary | ICD-10-CM

## 2012-09-24 DIAGNOSIS — I10 Essential (primary) hypertension: Secondary | ICD-10-CM

## 2012-09-24 DIAGNOSIS — I25810 Atherosclerosis of coronary artery bypass graft(s) without angina pectoris: Principal | ICD-10-CM

## 2012-09-24 DIAGNOSIS — N183 Chronic kidney disease, stage 3 unspecified: Secondary | ICD-10-CM

## 2012-09-24 LAB — BASIC METABOLIC PANEL
CO2: 25 mEq/L (ref 19–32)
Chloride: 107 mEq/L (ref 96–112)
Creatinine, Ser: 1.43 mg/dL — ABNORMAL HIGH (ref 0.50–1.35)
GFR calc Af Amer: 57 mL/min — ABNORMAL LOW (ref >90)
GFR calc non Af Amer: 50 mL/min — ABNORMAL LOW (ref >90)
Potassium: 3.8 mEq/L (ref 3.5–5.1)

## 2012-09-24 LAB — CBC
MCHC: 36.5 g/dL — ABNORMAL HIGH (ref 30.0–36.0)
Platelets: 121 10*3/uL — ABNORMAL LOW (ref 150–400)
RBC: 4.36 MIL/uL (ref 4.22–5.81)
RDW: 12.5 % (ref 11.5–15.5)
WBC: 5.6 10*3/uL (ref 4.0–10.5)

## 2012-09-24 MED ORDER — AMLODIPINE BESYLATE 2.5 MG PO TABS: 2.5000 mg | ORAL_TABLET | Freq: Every day | ORAL | Status: DC

## 2012-09-24 MED ORDER — AMLODIPINE BESYLATE 2.5 MG PO TABS
2.5000 mg | ORAL_TABLET | Freq: Every day | ORAL | Status: DC
  Administered 2012-09-24: 2.5 mg via ORAL
  Filled 2012-09-24 (×2): qty 1

## 2012-09-24 MED FILL — Sodium Chloride IV Soln 0.9%: INTRAVENOUS | Qty: 50 | Status: AC

## 2012-09-24 NOTE — Progress Notes (Signed)
[phone] Came to see pt but did not walk due to elevated BP. 189/75 lying and retook at 203/76. 61 SR. RN and PA aware of elevated BP and meds have been adjusted. RN to walk with pt later when BP stable. Education completed with pt who voiced understanding. Discussed watching sodium intake with elevated BP. Gave heart healthy diet. Discussed CRP2 but pt declined. Stated he had 66 yr old son and he is busy with him. Stated he gets lots of ex with playing with son. Gave written ex ed for pt to follow and discussed importance of walking. Luetta Nutting RNBSN

## 2012-09-24 NOTE — Discharge Summary (Signed)
Patient seen and examined and history reviewed. Agree with above findings and plan. See my earlier rounding note.  Theron Arista [facility] 09/24/2012 8:21 AM

## 2012-09-24 NOTE — Progress Notes (Signed)
   TELEMETRY: Reviewed telemetry pt in sinus brady: Filed Vitals:   09/23/12 2112 09/24/12 0015 09/24/12 0644 09/24/12 0651  BP: 159/72 152/65 188/86 188/86  Pulse: 66 60 58 64  Temp: 98.1 F (36.7 C)  97.6 F (36.4 C)   TempSrc: Oral Oral Oral   Resp: 17 15 18    Height:      Weight:  188 lb 15 oz (85.7 kg)    SpO2: 95% 95% 99%     Intake/Output Summary (Last 24 hours) at 09/24/12 0730 Last data filed at 09/24/12 4098  Gross per 24 hour  Intake 1509.75 ml  Output      0 ml  Net 1509.75 ml    SUBJECTIVE: " I feel great". No chest pain or SOB. No groin pain.  LABS: Basic Metabolic Panel:  Recent Labs  11/91/47 1216 09/23/12 0505  NA 137 141  K 4.0 3.7  CL 104 107  CO2 25 25  GLUCOSE 198* 135*  BUN 16 15  CREATININE 1.40* 1.49*  CALCIUM 9.1 8.7   CBC:  Recent Labs  09/22/12 1216 09/23/12 0505 09/24/12 0605  WBC 6.2 6.3 5.6  NEUTROABS 3.8  --   --   HGB 15.3 14.0 14.4  HCT 41.9 39.6 39.5  MCV 89.7 90.4 90.6  PLT 137* 120* 121*   Cardiac Enzymes:  Recent Labs  09/22/12 2202 09/23/12 0150 09/23/12 1407  TROPONINI <0.30 <0.30 <0.30   BNP: 701.5 Fasting Lipid Panel:  Recent Labs  09/23/12 0505  CHOL 134  HDL 32*  LDLCALC 80  TRIG 829  CHOLHDL 4.2    Radiology/Studies:  Dg Chest 2 View  09/22/2012   *RADIOLOGY REPORT*  Clinical Data: Chest pain  CHEST - 2 VIEW  Comparison: 06/05/2010  Findings: Changes of median sternotomy for CABG.  Heart size, mediastinal contours, and hilar contours are normal. Coronary artery stent noted.  Pulmonary vascularity is normal.  The lungs are well expanded and clear and the trachea is midline.  The bones are unremarkable.  IMPRESSION: No acute cardiopulmonary disease.  Prior CABG and coronary artery stent placement.   Original Report Authenticated By: Britta Mccreedy, M.D.   Ecg: sinus bradycardia. Nonspecific T wave abnormality  PHYSICAL EXAM General: Well developed, well nourished, in no acute  distress. Head: Normal Neck: Negative for carotid bruits. JVD not elevated. Lungs: Clear bilaterally to auscultation without wheezes, rales, or rhonchi. Breathing is unlabored. Heart: RRR S1 S2 without murmurs, rubs, or gallops.  Abdomen: Soft, non-tender, non-distended with normoactive bowel sounds. No hepatomegaly. No rebound/guarding. No obvious abdominal masses. Extremities: No clubbing, cyanosis or edema.  Distal pedal pulses are 2+ and equal bilaterally. No groin hematoma. Neuro: Alert and oriented X 3. Moves all extremities spontaneously. Psych:  Responds to questions appropriately with a normal affect.  ASSESSMENT AND PLAN: 1. Unstable angina. S/p PTCA of SVG to OM for instent restenosis. Continue ASA and Plavix. Stable for DC today. Follow up with Dr. Excell Seltzer in 2 weeks. 2. HTN- BP significantly elevated. Prinivil held for cath procedure. Will resume if renal function OK. Review of recent office records indicate suboptimal BP control. Will add amlodipine 2.5 mg daily. Continue current metoprolol dose. 3. CKD stage 3. BMET pending. 4. Hyperlipidemia. Recommend stopping Niaspan and continuing lipitor only. 5. GERD-continue Protonix.  Active Problems:   Unstable angina    Signed, Jakiyah Stepney [place] MD,FACC 09/24/2012 7:38 AM

## 2012-09-24 NOTE — Progress Notes (Signed)
Nutrition Brief Note  Malnutrition Screening Tool result is inaccurate.  Please consult if nutrition needs are identified.  Taziyah Iannuzzi RD, LDN Pager #[phone] After Hours pager #[phone]   

## 2012-09-24 NOTE — Discharge Summary (Signed)
Patient ID: [name],  MRN: [id number], DOB/AGE: 07/25/1945 67 y.o.  Admit date: 09/22/2012 Discharge date: 09/24/2012  Primary Care Provider: [facility] Primary Cardiologist: Judie Petit. Excell Seltzer, MD   Discharge Diagnoses Principal Problem:   Unstable angina  **s/p PTCA of the proximal VG->OM secondary to in-stent restenosis.  Active Problems:   CAD, AUTOLOGOUS BYPASS GRAFT   HYPERTENSION   HYPERLIPIDEMIA   CKD III  Allergies No Known Allergies  Procedures  Cardiac Catheterization and Percutaneous Coronary Intervention 9.23.2014  Hemodynamics:    LV pressure: 145/9 Aortic pressure: 141/65  Angiography    Left Main: moderate in size, mild  diffuse disease Left anterior Descending: severely diseased diffuse disease and the occluded after giving off several small and severely diseased diags.    Left Circumflex: occluded Right Coronary Artery: moderate and dominant.  The long stent has minimal luminal instent restenosis  LV Gram: not performed ( to save contrast) ,  AV crossed for pressures only    VG to OM:  Large graft.  The proximal stent ( 3.0 x 23 Promus)  has a tight, eccentric 95% stenosis.  The distal SVG stent ( 3.5 x 23 Promus) is minimal restenosis. SVG to  Acute marginal: chronically occluded.  LIMA to LAD:  Normal graft.  Anastomosis to the LAD is normal.  The diagonal  fills in a retrograde fashion.  The LAD provides collaterals to the LCx system. _____________   History of Present Illness  67 y/o male with prior h/o CAD s/p CABG with subsequent stenting of the VG->OM in 2012.  He was in his usual state of health until 3 days prior to admission when he began to experience intermittent exertional chest burning relieved with asa and rest.  He had intermittent symptoms over the subsequent 2 days and on the day of admission, symptoms became associated with nausea and right arm pain.  He presented to the [facility] ED for evaluation, where ECG was non-acute and initial  troponin was normal.  He was admitted for further evaluation.  Hospital Course  Pt ruled out for MI.  He had no further chest pain.  Given symptoms and history of CAD, decision was made to pursue diagnostic catheterization.  This was performed on 9/23, revealing known, severe native multivessel disease.  Severe in-stent restenosis was noted within the previously placed stent in the proximal vein graft to the obtuse marginal.  Films were reviewed by the interventional team and this area was then successfully treated with balloon angioplasty.  Patient tolerated procedure well and post-procedure has been ambulating without recurrent symptoms or limitations.  He will be discharged home today in good condition.  Of note, he has been hypertensive and we have added amlodipine to his current regimen.  His home dose of ACEI therapy was on hold in the setting of CKD, however this will be resumed at discharge as well.  Discharge Vitals Blood pressure 189/75, pulse 58, temperature 98.1 F (36.7 C), temperature source Oral, resp. rate 18, height 5\' 6"  (1.676 m), weight 188 lb 15 oz (85.7 kg), SpO2 99.00%.  Filed Weights   09/22/12 1202 09/22/12 2108 09/24/12 0015  Weight: 186 lb 3.2 oz (84.46 kg) 186 lb 9.6 oz (84.641 kg) 188 lb 15 oz (85.7 kg)   Labs  CBC  Recent Labs  09/22/12 1216 09/23/12 0505 09/24/12 0605  WBC 6.2 6.3 5.6  NEUTROABS 3.8  --   --   HGB 15.3 14.0 14.4  HCT 41.9 39.6 39.5  MCV 89.7 90.4 90.6  PLT 137* 120* 121*   Basic Metabolic Panel  Recent Labs  09/23/12 0505 09/24/12 0605  NA 141 139  K 3.7 3.8  CL 107 107  CO2 25 25  GLUCOSE 135* 148*  BUN 15 14  CREATININE 1.49* 1.43*  CALCIUM 8.7 8.6   Cardiac Enzymes  Recent Labs  09/22/12 2202 09/23/12 0150 09/23/12 1407  TROPONINI <0.30 <0.30 <0.30   Fasting Lipid Panel  Recent Labs  09/23/12 0505  CHOL 134  HDL 32*  LDLCALC 80  TRIG 161  CHOLHDL 4.2   Disposition  Pt is being discharged home today in  good condition.  Follow-up Plans & Appointments      Follow-up Information   Follow up with Tereso Newcomer, PA-C. (we will arrange and contact you.)    Specialty:  Physician Assistant   Contact information:   [address] [place] [address] [phone]       Follow up with Benjiman Core, MD. (as scheduled.)    Specialty:  Family Medicine     Discharge Medications    Medication List    STOP taking these medications       niacin 750 MG CR tablet  Commonly known as:  NIASPAN      TAKE these medications       amLODipine 2.5 MG tablet  Commonly known as:  NORVASC  Take 1 tablet (2.5 mg total) by mouth daily.     aspirin 81 MG tablet  Take 81 mg by mouth daily.     atorvastatin 40 MG tablet  Commonly known as:  LIPITOR  Take 1 tablet (40 mg total) by mouth daily.     clopidogrel 75 MG tablet  Commonly known as:  PLAVIX  Take 1 tablet (75 mg total) by mouth daily.     ferrous sulfate 325 (65 FE) MG tablet  Take 325 mg by mouth as directed.     furosemide 20 MG tablet  Commonly known as:  LASIX  Take 20 mg by mouth daily as needed (for swelling).     lisinopril 20 MG tablet  Commonly known as:  PRINIVIL,ZESTRIL  Take 20 mg by mouth daily.     metoprolol 50 MG tablet  Commonly known as:  LOPRESSOR  Take 1 tablet (50 mg total) by mouth 2 (two) times daily.     nitroGLYCERIN 0.4 MG SL tablet  Commonly known as:  NITROSTAT  Place 0.4 mg under the tongue every 5 (five) minutes as needed for chest pain.     pantoprazole 40 MG tablet  Commonly known as:  PROTONIX  Take 1 tablet (40 mg total) by mouth daily.       Outstanding Labs/Studies  F/u bmet at return office appt.  Duration of Discharge Encounter   Greater than 30 minutes including physician time.  Signed, Nicolasa Ducking NP 09/24/2012, 8:16 AM

## 2012-10-01 ENCOUNTER — Other Ambulatory Visit: Payer: Self-pay

## 2012-10-01 MED ORDER — ATORVASTATIN CALCIUM 40 MG PO TABS: 40.0000 mg | ORAL_TABLET | Freq: Every day | ORAL | Status: DC

## 2012-10-14 ENCOUNTER — Other Ambulatory Visit: Payer: Self-pay

## 2012-10-14 MED ORDER — NITROGLYCERIN 0.4 MG SL SUBL: 0.4000 mg | SUBLINGUAL_TABLET | SUBLINGUAL | Status: DC | PRN

## 2012-10-14 MED ORDER — METOPROLOL TARTRATE 50 MG PO TABS: 50.0000 mg | ORAL_TABLET | Freq: Two times a day (BID) | ORAL | Status: DC

## 2012-11-14 ENCOUNTER — Other Ambulatory Visit: Payer: Self-pay

## 2012-11-14 DIAGNOSIS — I259 Chronic ischemic heart disease, unspecified: Secondary | ICD-10-CM

## 2012-11-14 MED ORDER — CLOPIDOGREL BISULFATE 75 MG PO TABS: 75.0000 mg | ORAL_TABLET | Freq: Every day | ORAL | Status: DC

## 2012-12-10 ENCOUNTER — Other Ambulatory Visit: Payer: Self-pay | Admitting: Cardiovascular Disease

## 2013-04-15 ENCOUNTER — Telehealth: Payer: Self-pay | Admitting: Cardiovascular Disease

## 2013-04-15 NOTE — Telephone Encounter (Signed)
I spoke with the pt's wife and she said the pt saw a urologist Dr Greggory Stallion with [facility] in [place] [phone]).  The pt has multiple kidney stones and Dr Greggory Stallion would like to perform lithotripsy on the pt in [place]. She said that Dr Christoper Allegra office will be contacting us for clearance.  At this time she does not want the pt to have this procedure in [place] because if the pt has a heart problem Dr Burt Knack will not be available.  I made her aware that she can contact [facility] in [place] to see if they will see the pt. I made her aware that either way the pt would have to be cleared by Dr Burt Knack for procedure and that we would have to advise about plavix.  At this time she plans to try and arrange follow-up in [place].

## 2013-04-15 NOTE — Telephone Encounter (Signed)
New message     Pt needs to have lithroptsy (breaking up kidney stones).  Do you know a doctor in [place] who do this?  His doctor want him to go [facility] to have this done. Also, will Dr Burt Knack clear him to have this procedure.

## 2013-04-22 ENCOUNTER — Telehealth: Payer: Self-pay | Admitting: Cardiovascular Disease

## 2013-04-22 NOTE — Telephone Encounter (Signed)
New message     Faxing clearance for lithotripsy and stop aspirin and plavix.  Want nurse to know form is coming.

## 2013-04-24 NOTE — Telephone Encounter (Signed)
Per Dr Burt Knack the pt will need to be seen in the office for evaluation.  Appt scheduled on 04/28/13.  Pt's wife aware and I left a message at [facility].

## 2013-04-28 ENCOUNTER — Encounter: Payer: Self-pay | Admitting: Cardiovascular Disease

## 2013-04-28 ENCOUNTER — Ambulatory Visit (INDEPENDENT_AMBULATORY_CARE_PROVIDER_SITE_OTHER): Payer: Medicare Other | Admitting: Cardiovascular Disease

## 2013-04-28 VITALS — BP 142/78 | HR 62 | Ht 66.0 in | Wt 185.1 lb

## 2013-04-28 DIAGNOSIS — I25810 Atherosclerosis of coronary artery bypass graft(s) without angina pectoris: Secondary | ICD-10-CM

## 2013-04-28 DIAGNOSIS — E785 Hyperlipidemia, unspecified: Secondary | ICD-10-CM

## 2013-04-28 DIAGNOSIS — I10 Essential (primary) hypertension: Secondary | ICD-10-CM

## 2013-04-28 NOTE — Patient Instructions (Signed)
Your physician wants you to follow-up in: September 2015 with Dr Burt Knack.  You will receive a reminder letter in the mail two months in advance. If you don't receive a letter, please call our office to schedule the follow-up appointment.  Your physician recommends that you continue on your current medications as directed. Please refer to the Current Medication list given to you today.

## 2013-04-28 NOTE — Progress Notes (Signed)
HPI:   68 year old gentleman presenting for followup cardiac evaluation. The patient has fairly extensive CAD. He underwent multivessel CABG in 1993. He's undergone PCI of the vein graft obtuse marginal initially in 2009 and again in 2012 and then most recently in September 2014. He's also undergone multiple PCI procedures in the native RCA. His LIMA graft has remained widely patent. The patient has developed recurrent problems with kidney stones and has been tentatively scheduled for lithotripsy. This would require holding his aspirin and Plavix for a period of 5 days before the surgery. He presents today for preoperative assessment.  He denies any recent episodes of chest pain or pressure. He denies dyspnea, orthopnea, PND, or palpitations. He's had no bleeding problems and has remained on aspirin and Plavix. He reports no recent symptoms like those of his previous acute coronary syndrome episodes.  Outpatient Encounter Prescriptions as of 04/28/2013  Medication Sig  . amLODipine (NORVASC) 2.5 MG tablet Take 1 tablet (2.5 mg total) by mouth daily.  Marland Kitchen aspirin 81 MG tablet Take 81 mg by mouth daily.   Marland Kitchen atorvastatin (LIPITOR) 40 MG tablet Take 1 tablet (40 mg total) by mouth daily.  . clopidogrel (PLAVIX) 75 MG tablet Take 1 tablet (75 mg total) by mouth daily.  . ferrous sulfate 325 (65 FE) MG tablet Take 325 mg by mouth as directed.  . furosemide (LASIX) 20 MG tablet Take 20 mg by mouth daily as needed (for swelling).  Marland Kitchen lisinopril (PRINIVIL,ZESTRIL) 20 MG tablet TAKE ONE TABLET BY MOUTH ONCE DAILY  . metoprolol (LOPRESSOR) 50 MG tablet Take 1 tablet (50 mg total) by mouth 2 (two) times daily.  . nitroGLYCERIN (NITROSTAT) 0.4 MG SL tablet Place 1 tablet (0.4 mg total) under the tongue every 5 (five) minutes as needed for chest pain.  . pantoprazole (PROTONIX) 40 MG tablet Take 1 tablet (40 mg total) by mouth daily.    No Known Allergies  Past Medical History  Diagnosis Date  . CAD  (coronary artery disease)     a. CABG 1993;  b. PCI of SVG to OM Jan 2012 (Drug-eluting stent x 2);  c. 09/2012 Cath/PCI: LM nl, LAD sev dzs, LCX 100, RCA patent stent, VG->AM 100, LIMA->LAD nl, VG->OM 95p ISR (PTCA only), patent distal stent.  . History of nephrolithiasis   . Stricture and stenosis of esophagus   . Degeneration of lumbar or lumbosacral intervertebral disc   . Hyperlipidemia   . Hypertension   . Anemia   . Blood transfusion   . GERD (gastroesophageal reflux disease)   . Hyperplastic colon polyp   . CKD (chronic kidney disease), stage III     ROS: Positive for left neck pain, otherwise negative except as per HPI  BP 142/78  Pulse 62  Ht 5\' 6"  (1.676 m)  Wt 185 lb 1.9 oz (83.97 kg)  BMI 29.89 kg/m2  PHYSICAL EXAM: Pt is alert and oriented, NAD HEENT: normal Neck: JVP - normal, carotids 2+= without bruits Lungs: CTA bilaterally CV: RRR without murmur or gallop Abd: soft, NT, Positive BS, no hepatomegaly Ext: no C/C/E, distal pulses intact and equal Skin: warm/dry no rash  EKG:  Normal sinus rhythm 62 beats per minute, nonspecific ST and T wave abnormality.  PCI note 09/23/2012: Indication: 68 yo WM with history of CAD s/p CABG in 1993. He is s/p extensive stenting of the RCA in 2007. He is s/p Stenting of the SVG to the OM in 2009 with DES (Promus) stents. In  01/2010 he had restenosis and had repeat stenting with Ion DES. He presents now with unstable angina class IV. Diagnostic cardiac cath showed patent LIMA to the LAD and patent native RCA. There was a focal 95% stenosis in the proximal SVG to the OM at the prior stent site.  Procedural Details: A 6 Fr sheath was exchanged into the right femoral artery for the diagnostic sheath. Weight-based bivalirudin was given for anticoagulation. Once a therapeutic ACT was achieved, a 6 French LCB guide catheter was inserted. A filter coronary guidewire was used to cross the lesion and the filter deployed in the mid graft. The  lesion was then dilated with a 3.5 mm balloon. Following PCI, there was 0% residual stenosis and TIMI-3 flow. The filter was retrieved without difficulty. Final angiography confirmed an excellent result. The patient tolerated the procedure well. There were no immediate procedural complications. Femoral hemostasis was achieved with Manual compression. The patient was transferred to the post catheterization recovery area for further monitoring.  Lesion Data:  Vessel: SVG to OM  Percent stenosis (pre): 95%  TIMI-flow (pre): 2  Balloon angioplasty only  Percent stenosis (post): 0%  TIMI-flow (post): 3  Conclusions: Successful PTCA of the SVG to the OM for in stent restenosis.  Recommendations: continue dual antiplatelet therapy indefinitely.   ASSESSMENT AND PLAN: 1. Coronary artery disease, extensive. This involves both native vessel and bypass graft disease. He has had multiple episodes of acute coronary syndrome with most recent PCI in September 2014. I think the patient's risk of temporarily holding aspirin and Plavix within 12 months of his last PCI is significant. He is currently having no pain related to his large kidney stones. He feels like he can wait for surgery. When he is out to 12 months from last PCI it would be reasonable to proceed. However, considering his history there will always be some risk of holding ASA and plavix. May be reasonable to consider bridging options when he moves forward with lithotripsy. Case discussed with Dr Diona Fanti. Will arrange follow-up in September when he is out to 12 months from his most recent PCI.  2. HTN - BP controlled on current Rx.  3. Hyperlipidemia: on atorvastatin. Lipids from September 2014 as below:  Lipid Panel     Component Value Date/Time   CHOL 134 09/23/2012 0505   TRIG 111 09/23/2012 0505   HDL 32* 09/23/2012 0505   CHOLHDL 4.2 09/23/2012 0505   VLDL 22 09/23/2012 0505   LDLCALC 80 09/23/2012 0505   I will see him back in September  unless problems arise in the interim. thx  Sherren Mocha 04/28/2013 2:45 PM

## 2013-06-04 ENCOUNTER — Ambulatory Visit (INDEPENDENT_AMBULATORY_CARE_PROVIDER_SITE_OTHER): Payer: Medicare Other

## 2013-06-04 VITALS — BP 159/67 | HR 68 | Resp 18

## 2013-06-04 DIAGNOSIS — M7730 Calcaneal spur, unspecified foot: Secondary | ICD-10-CM

## 2013-06-04 DIAGNOSIS — I25810 Atherosclerosis of coronary artery bypass graft(s) without angina pectoris: Secondary | ICD-10-CM

## 2013-06-04 DIAGNOSIS — M722 Plantar fascial fibromatosis: Secondary | ICD-10-CM

## 2013-06-04 DIAGNOSIS — M79609 Pain in unspecified limb: Secondary | ICD-10-CM

## 2013-06-04 MED ORDER — TRIAMCINOLONE ACETONIDE 10 MG/ML IJ SUSP: 10.0000 mg | Freq: Once | INTRAMUSCULAR | Status: DC

## 2013-06-04 NOTE — Patient Instructions (Signed)

## 2013-06-04 NOTE — Progress Notes (Signed)
   Subjective:    Patient ID: [name], male    DOB: Jul 20, 1945, 68 y.o.   MRN: [id number]  HPI  PT STATED LT FOOT BACK AND BOTTOM OF THE HEEL I ACHING FOR 4 WEEKS. THE FOOT GETTING WORSE ESPECIALLY FIRST THING IN THE MORNING. THE HEEL GET AGGRAVATED BY WALKING AND STANDING, BUT TRIED NO TREATMENT.    Review of Systems  Constitutional: Positive for appetite change.  Musculoskeletal: Positive for gait problem.  All other systems reviewed and are negative.      Objective:   Physical Exam C82-year-old white male well-developed well-nourished returns 3 presents this time with left heel pain several month history of increasing pain pain on first up in the morning or getting out for a period of rest patient is tried no significant treatment does work relatively good shoes however does have to walk up and down hills as his house on help does work to shop at times continues to have pain in every step in the morning. Okay objective findings as follows vascular status is intact although somewhat weakened patient is pulses DP and PT plus one hour 4 bilateral thready at best to not strong bounding pulses temperature is warm to cool turgor somewhat diminished there is no significant edema no varicosities noted no rubor pallor noted this time. Capillary refill timed 3-4 seconds all digits neurologically epicritic and proprioceptive sensations intact and symmetric bilateral there is normal plantar response and DTRs noted dermatologically skin color pigment normal hair growth absent diminished distally nail somewhat criptotic orthopedic exam there is pain on palpation medial band of the plantar fascia from mid arch to inferior calcaneal tubercle on the left right foot is asymptomatic x-rays reveal relatively high arch there is mild to moderate inferior calcaneal spurring thickening plantar fascial structures on the left heel in the arch and medial calcaneal tubercle area. No fractures cyst or tumors are noted.  X-rays also reveal several vascular clips and the ankle and lower leg area. Patient is status post coronary bypass surgery has had significant CAD currently on aspirin and Plavix therapies.       Assessment & Plan:  Assessment this time is plantar fasciitis/heel spur syndrome right left foot plan at this time injection tender with Kenalog 20 mg Xylocaine plain infiltrated the inferior left heel fascial strapping is applied maintain a good stable walking shoe may be candidate for orthoses in the future based on progress and improvement with taping and steroid injection. As far as pain medications recommend plain Tylenol only do his cardiovascular history avoid NSAID therapies also this time recommended ice pack to the heel every evening or couple times a day whenever possible. Recheck in 2 weeks for followup and possible discussion of orthotic therapies as needed plantar fasciitis booklet is also dispensed at this time for him to review next  [name]

## 2013-06-10 ENCOUNTER — Other Ambulatory Visit: Payer: Self-pay | Admitting: *Deleted

## 2013-06-10 MED ORDER — AMLODIPINE BESYLATE 2.5 MG PO TABS: 2.5000 mg | ORAL_TABLET | Freq: Every day | ORAL | Status: DC

## 2013-06-18 ENCOUNTER — Ambulatory Visit (INDEPENDENT_AMBULATORY_CARE_PROVIDER_SITE_OTHER): Payer: Medicare Other

## 2013-06-18 VITALS — BP 116/64 | HR 66 | Resp 18

## 2013-06-18 DIAGNOSIS — M7730 Calcaneal spur, unspecified foot: Secondary | ICD-10-CM

## 2013-06-18 DIAGNOSIS — M722 Plantar fascial fibromatosis: Secondary | ICD-10-CM

## 2013-06-18 DIAGNOSIS — M79609 Pain in unspecified limb: Secondary | ICD-10-CM

## 2013-06-18 NOTE — Progress Notes (Signed)
   Subjective:    Patient ID: [name], male    DOB: 12/05/1945, 68 y.o.   MRN: [id number]  HPI I AM DOING A LITTLE BETTER THAN I WAS BUT IT DOESN'T HURT DOING THE DAY IT HURTS WHEN I SIT AND TRY TO GET BACK UP AND FEELS LIKE A TOOTHACHE AND THE TAPE DID HELP     Review of Systems no new findings or systemic changes to     Objective:   Physical Exam Lower extremity objective findings unchanged patient presents at this time continues to have some pain inferior heel area not as severe as it had been initially the taping definitely had improvement all was in place also did the steroid injection patient again has intact neurovascular status pedal pulses palpable rectus foot type otherwise noted would benefit from orthoses based on and benefits of fascial strapping at this time we'll dispensed 1 pair of power step OTC orthotics with break in wearing instructions for use patient understands these are non-covered by Medicare and ABN form is reviewed and signed by the patient.       Assessment & Plan:  Assessment plantar fasciitis/heel spur syndrome left foot dispensed power step orthotics with break in in wearing instructions written instructions given ABN form signed recheck in one to 2 months for adjustments and orthotic check if needed may consider prescription orthoses if OTC orthotic not adequate we'll reevaluate in the future next  [name]

## 2013-06-18 NOTE — Patient Instructions (Signed)

## 2013-08-24 ENCOUNTER — Other Ambulatory Visit: Payer: Self-pay | Admitting: Cardiovascular Disease

## 2013-08-25 ENCOUNTER — Telehealth: Payer: Self-pay | Admitting: Cardiovascular Disease

## 2013-08-25 NOTE — Telephone Encounter (Signed)
I spoke with [name] and Dr Diona Fanti would like to schedule the pt for a Percutaneous Nephrolithotomy due to kidney stones.  The pt will need general anesthesia (2 1/2 hours), overnight stay. The pt will need to hold ASA and Plavix for procedure. Dr Burt Knack discussed this issue at 04/28/13 office visit.  We can fax response to [phone].  I will forward this message to Dr Burt Knack for review.

## 2013-08-25 NOTE — Telephone Encounter (Signed)
Pt now approaching 12 months out from most recent PCI. He is ok to proceed without cardiac testing. Hold ASA and plavix x 5 days if ok with Dr Diona Fanti. I do not think he needs anticoagulation bridging for the surgery, but would like him to resume ASA/plavix following the procedure as soon as it is safe from a post-op perspective. thx  Sherren Mocha 08/25/2013 5:29 PM

## 2013-08-25 NOTE — Telephone Encounter (Signed)
°  Celita from [facility] calling to get surgical clearance. Please call and advise.

## 2013-08-25 NOTE — Telephone Encounter (Signed)
Left message on machine for [name] to contact the office.

## 2013-08-26 ENCOUNTER — Other Ambulatory Visit: Payer: Self-pay | Admitting: Urology

## 2013-08-26 DIAGNOSIS — N200 Calculus of kidney: Secondary | ICD-10-CM

## 2013-08-26 NOTE — Telephone Encounter (Signed)
Dr Cooper's response faxed to [phone].

## 2013-09-02 ENCOUNTER — Ambulatory Visit: Payer: Medicare Other

## 2013-09-03 ENCOUNTER — Ambulatory Visit (INDEPENDENT_AMBULATORY_CARE_PROVIDER_SITE_OTHER): Payer: Medicare Other

## 2013-09-03 ENCOUNTER — Encounter (HOSPITAL_COMMUNITY): Payer: Self-pay | Admitting: Pharmacy Technician

## 2013-09-03 VITALS — BP 133/67 | HR 52 | Resp 18

## 2013-09-03 DIAGNOSIS — M722 Plantar fascial fibromatosis: Secondary | ICD-10-CM

## 2013-09-03 DIAGNOSIS — M7732 Calcaneal spur, left foot: Secondary | ICD-10-CM

## 2013-09-03 DIAGNOSIS — M79672 Pain in left foot: Secondary | ICD-10-CM

## 2013-09-03 DIAGNOSIS — M7730 Calcaneal spur, unspecified foot: Secondary | ICD-10-CM

## 2013-09-03 DIAGNOSIS — M79609 Pain in unspecified limb: Secondary | ICD-10-CM

## 2013-09-03 DIAGNOSIS — I25810 Atherosclerosis of coronary artery bypass graft(s) without angina pectoris: Secondary | ICD-10-CM

## 2013-09-03 MED ORDER — TRIAMCINOLONE ACETONIDE 10 MG/ML IJ SUSP: 10.0000 mg | Freq: Once | INTRAMUSCULAR | Status: DC

## 2013-09-03 NOTE — Progress Notes (Signed)
   Subjective:    Patient ID: [name], male    DOB: 03-Aug-1945, 68 y.o.   MRN: [id number]  HPI my left heel has been hurting for about two weeks and hurts on the back toward the outside    Review of Systems no new findings or systemic changes noted     Objective:   Physical Exam Neurovascular status is intact pedal pulses palpable the medial heel and medial arch. Improve his wearing the orthotics as instructed also using some over-the-counter pain medicines are Tylenol or Advil occasionally. Are having pain in the posterior and lateral heel lateral calcaneal tubercle area on direct palpation. As such this time the injection tender with Kenalog 20 mg Xylocaine plain is recommended and infiltrated to the lateral calcaneal area lateral band plantar fascia insertion. Patient how the injection well is advised in warm compress ice pack and maintain orthoses at this time.       Assessment & Plan:  Assessment is improving plantar fasciitis/heel spur syndrome second injection given at this time this time and the lateral heel patient provided indicated felt that in her fracture a right [name] been having is pain in the medial arch is considerably improved recheck in one to 2 months if still needed for orthotic check adjustments in the future as needed maintain warm compress ice pack as recommended  [name]

## 2013-09-03 NOTE — Patient Instructions (Addendum)
ICE INSTRUCTIONS  Apply ice or cold pack to the affected area at least 3 times a day for 10-15 minutes each time.  You should also use ice after prolonged activity or vigorous exercise.  Do not apply ice longer than 20 minutes at one time.  Always keep a cloth between your skin and the ice pack to prevent burns.  Being consistent and following these instructions will help control your symptoms.  We suggest you purchase a gel ice pack because they are reusable and do bit leak.  Some of them are designed to wrap around the area.  Use the method that works best for you.  Here are some other suggestions for icing.   Use a frozen bag of peas or corn-inexpensive and molds well to your body, usually stays frozen for 10 to 20 minutes.  Wet a towel with cold water and squeeze out the excess until it's damp.  Place in a bag in the freezer for 20 minutes. Then remove and use.  Alternate applying hot towel or warm compress for 5-10 minutes then ice pack for 10 minutes repeat hot cold hot and cold 2 or 3 times every night

## 2013-09-14 ENCOUNTER — Inpatient Hospital Stay (HOSPITAL_COMMUNITY)
Admission: RE | Admit: 2013-09-14 | Discharge: 2013-09-14 | Disposition: A | Discharge status: Home / Self Care | Payer: Medicare Other | Source: Ambulatory Visit

## 2013-09-14 NOTE — Patient Instructions (Signed)
[name]  09/14/2013   Your procedure is scheduled on:  09/17/2013    Report to [facility].  Follow the Signs to [place] at    [place].  Go to Radiology at 0730am and then to Short Stay.   Call this number if you have problems the morning of surgery: [phone]   Remember:   Do not eat food or drink liquids after midnight.   Take these medicines the morning of surgery with A SIP OF WATER:    Do not wear jewelry,   Do not wear lotions, powders, or perfumes., deodorant.     Men may shave face and neck.  Do not bring valuables to the hospital.  Contacts, dentures or bridgework may not be worn into surgery.  Leave suitcase in the car. After surgery it may be brought to your room.  For patients admitted to the hospital, checkout time is 11:00 AM the day of  discharge.          Please read over the following fact sheets that you were given: [facility] - Preparing for Surgery Before surgery, you can play an important role.  Because skin is not sterile, your skin needs to be as free of germs as possible.  You can reduce the number of germs on your skin by washing with CHG (chlorahexidine gluconate) soap before surgery.  CHG is an antiseptic cleaner which kills germs and bonds with the skin to continue killing germs even after washing. Please DO NOT use if you have an allergy to CHG or antibacterial soaps.  If your skin becomes reddened/irritated stop using the CHG and inform your nurse when you arrive at Short Stay. Do not shave (including legs and underarms) for at least 48 hours prior to the first CHG shower.  You may shave your face/neck. Please follow these instructions carefully:  1.  Shower with CHG Soap the night before surgery and the  morning of Surgery.  2.  If you choose to wash your hair, wash your hair first as usual with your  normal  shampoo.  3.  After you shampoo, rinse your hair and body thoroughly to remove the  shampoo.                            4.  Use CHG as you would any other liquid soap.  You can apply chg directly  to the skin and wash                       Gently with a scrungie or clean washcloth.  5.  Apply the CHG Soap to your body ONLY FROM THE NECK DOWN.   Do not use on face/ open                           Wound or open sores. Avoid contact with eyes, ears mouth and genitals (private parts).                       Wash face,  Genitals (private parts) with your normal soap.             6.  Wash thoroughly, paying special attention to the area where your surgery  will be performed.  7.  Thoroughly rinse your body with warm water from the neck down.  8.  DO NOT shower/wash with your normal soap after using and rinsing off  the CHG Soap.                9.  Pat yourself dry with a clean towel.            10.  Wear clean pajamas.            11.  Place clean sheets on your bed the night of your first shower and do not  sleep with pets. Day of Surgery : Do not apply any lotions/deodorants the morning of surgery.  Please wear clean clothes to the hospital/surgery center.  FAILURE TO FOLLOW THESE INSTRUCTIONS MAY RESULT IN THE CANCELLATION OF YOUR SURGERY PATIENT SIGNATURE_________________________________  NURSE SIGNATURE__________________________________  ________________________________________________________________________ , coughing and deep breathing exercises, leg exercises

## 2013-09-15 ENCOUNTER — Other Ambulatory Visit: Payer: Self-pay | Admitting: Radiology

## 2013-09-15 NOTE — H&P (Signed)
Urology History and Physical Exam  CC: Right sided kidney stones  HPI: 68 year old male presents forright PCNL. His history is as follows:  This 68 year old male was self referred for treatment of kidney stones. He has a long-standing history of urolithiasis. He resides in [place], [place], and has seen a Dr. Janey Genta down there. He has bilateral renal calculi, right more symptomatic than left, and it was recommended that he have lithotripsy. Because his cardiologist is here in [place], he would like to be seen and treated here rather than an [facility]. He has had intermittent right flank pain over the past 10 days or so. He had a CT scan performed on 03/31/2013. This revealed bilateral renal stones-upper pole stone collection on the right approximately 11 mm, upper pole stone collection in the left approximately 15 mm. Skin to stone distance was under 12 cm. I have no information on Hounsfield units  In the past, he has had lithotripsy, ureteroscopy and ureteral stents.  He has coronary artery disease, early quiescent at the present time. He had CABG 4 by Dr. Redmond Pulling in 1993, has had multiple cardiac stents. He is followed by Dr. Sherren Mocha. He has been  on Plavix and aspirin.  He has been cleared to come off of these.   PMH: Past Medical History  Diagnosis Date  . CAD (coronary artery disease)     a. CABG 1993;  b. PCI of SVG to OM Jan 2012 (Drug-eluting stent x 2);  c. 09/2012 Cath/PCI: LM nl, LAD sev dzs, LCX 100, RCA patent stent, VG->AM 100, LIMA->LAD nl, VG->OM 95p ISR (PTCA only), patent distal stent.  . History of nephrolithiasis   . Stricture and stenosis of esophagus   . Degeneration of lumbar or lumbosacral intervertebral disc   . Hyperlipidemia   . Hypertension   . Anemia   . Blood transfusion   . GERD (gastroesophageal reflux disease)   . Hyperplastic colon polyp   . CKD (chronic kidney disease), stage III     PSH: Past Surgical History  Procedure  Laterality Date  . Coronary artery bypass graft  1993  . Lithotripsy    . Angioplasty    . Colonoscopy    . Lumbar back      Allergies: No Known Allergies  Medications: No prescriptions prior to admission     Social History: History   Social History  . Marital Status: Married    Spouse Name: N/A    Number of Children: 2  . Years of Education: N/A   Occupational History  . Retired     Chemical engineer   Social History Main Topics  . Smoking status: Former Smoker    Quit date: 01/06/1987  . Smokeless tobacco: Never Used  . Alcohol Use: No  . Drug Use: No  . Sexual Activity: Not on file   Other Topics Concern  . Not on file   Social History Narrative  . No narrative on file    Family History: Family History  Problem Relation Age of Onset  . Coronary artery disease Mother 29    deceased.status post CABG  . Lung cancer Brother   . Lung cancer Sister   . CAD Other 40    5 brothers  . CAD Other 59    4 sisters  . Kidney cancer Mother   . Colon cancer Neg Hx     Review of Systems: Positive: Right flank pain Negative:  A further 10 point review of systems  was negative except what is listed in the HPI.                  Physical Exam: @VITALS2 @ General: No acute distress.  Awake. Head:  Normocephalic.  Atraumatic. ENT:  EOMI.  Mucous membranes moist Neck:  Supple.  No lymphadenopathy. CV:  S1 present. S2 present. Regular rate. Pulmonary: Equal effort bilaterally.  Clear to auscultation bilaterally. Abdomen: Soft.  Nontender to palpation. Skin:  Normal turgor.  No visible rash. Extremity: No gross deformity of bilateral upper extremities.  No gross deformity of                             lower extremities. Neurologic: Alert. Appropriate mood.    Studies:  No results found for this basename: HGB, WBC, PLT,  in the last 72 hours  No results found for this basename: NA, K, CL, CO2, BUN, CREATININE, CALCIUM, MAGNESIUM, GFRNONAA, GFRAA,  in the last 72  hours   No results found for this basename: PT, INR, APTT,  in the last 72 hours   No components found with this basename: ABG,     Assessment:  Right renal calculi--largest stone > 20 mm  Plan: Right PCNL

## 2013-09-16 ENCOUNTER — Encounter (HOSPITAL_COMMUNITY)
Admission: RE | Admit: 2013-09-16 | Discharge: 2013-09-16 | Disposition: A | Discharge status: Home / Self Care | Payer: Medicare Other | Source: Ambulatory Visit | Attending: Urology | Admitting: Urology

## 2013-09-16 ENCOUNTER — Encounter (HOSPITAL_COMMUNITY): Payer: Self-pay

## 2013-09-16 DIAGNOSIS — Z79899 Other long term (current) drug therapy: Secondary | ICD-10-CM | POA: Diagnosis not present

## 2013-09-16 DIAGNOSIS — Z7982 Long term (current) use of aspirin: Secondary | ICD-10-CM | POA: Diagnosis not present

## 2013-09-16 DIAGNOSIS — Z87891 Personal history of nicotine dependence: Secondary | ICD-10-CM | POA: Diagnosis not present

## 2013-09-16 DIAGNOSIS — K219 Gastro-esophageal reflux disease without esophagitis: Secondary | ICD-10-CM | POA: Diagnosis not present

## 2013-09-16 DIAGNOSIS — Z86010 Personal history of colonic polyps: Secondary | ICD-10-CM | POA: Diagnosis not present

## 2013-09-16 DIAGNOSIS — I129 Hypertensive chronic kidney disease with stage 1 through stage 4 chronic kidney disease, or unspecified chronic kidney disease: Secondary | ICD-10-CM | POA: Diagnosis not present

## 2013-09-16 DIAGNOSIS — E785 Hyperlipidemia, unspecified: Secondary | ICD-10-CM | POA: Diagnosis not present

## 2013-09-16 DIAGNOSIS — I252 Old myocardial infarction: Secondary | ICD-10-CM | POA: Diagnosis not present

## 2013-09-16 DIAGNOSIS — N200 Calculus of kidney: Secondary | ICD-10-CM | POA: Diagnosis not present

## 2013-09-16 DIAGNOSIS — Z951 Presence of aortocoronary bypass graft: Secondary | ICD-10-CM | POA: Diagnosis not present

## 2013-09-16 DIAGNOSIS — I2510 Atherosclerotic heart disease of native coronary artery without angina pectoris: Secondary | ICD-10-CM | POA: Diagnosis not present

## 2013-09-16 DIAGNOSIS — N183 Chronic kidney disease, stage 3 unspecified: Secondary | ICD-10-CM | POA: Diagnosis not present

## 2013-09-16 HISTORY — DX: Personal history of other diseases of the digestive system: Z87.19

## 2013-09-16 LAB — CBC WITH DIFFERENTIAL/PLATELET
Basophils Absolute: 0 10*3/uL (ref 0.0–0.1)
Basophils Relative: 0 % (ref 0–1)
EOS ABS: 0.1 10*3/uL (ref 0.0–0.7)
EOS PCT: 2 % (ref 0–5)
HEMATOCRIT: 44.5 % (ref 39.0–52.0)
Hemoglobin: 15.3 g/dL (ref 13.0–17.0)
Lymphocytes Relative: 28 % (ref 12–46)
Lymphs Abs: 1.8 10*3/uL (ref 0.7–4.0)
MCH: 31.4 pg (ref 26.0–34.0)
MCHC: 34.4 g/dL (ref 30.0–36.0)
MCV: 91.4 fL (ref 78.0–100.0)
MONOS PCT: 10 % (ref 3–12)
Monocytes Absolute: 0.6 10*3/uL (ref 0.1–1.0)
Neutro Abs: 3.7 10*3/uL (ref 1.7–7.7)
Neutrophils Relative %: 60 % (ref 43–77)
PLATELETS: 165 10*3/uL (ref 150–400)
RBC: 4.87 MIL/uL (ref 4.22–5.81)
RDW: 12.8 % (ref 11.5–15.5)
WBC: 6.2 10*3/uL (ref 4.0–10.5)

## 2013-09-16 LAB — BASIC METABOLIC PANEL
Anion gap: 9 (ref 5–15)
BUN: 15 mg/dL (ref 6–23)
CO2: 26 meq/L (ref 19–32)
Calcium: 9.5 mg/dL (ref 8.4–10.5)
Chloride: 105 mEq/L (ref 96–112)
Creatinine, Ser: 1.37 mg/dL — ABNORMAL HIGH (ref 0.50–1.35)
GFR calc Af Amer: 60 mL/min — ABNORMAL LOW (ref >90)
GFR, EST NON AFRICAN AMERICAN: 52 mL/min — AB (ref >90)
Glucose, Bld: 130 mg/dL — ABNORMAL HIGH (ref 70–99)
Potassium: 4.5 mEq/L (ref 3.7–5.3)
Sodium: 140 mEq/L (ref 137–147)

## 2013-09-16 LAB — TYPE AND SCREEN
ABO/RH(D): B POS
ANTIBODY SCREEN: NEGATIVE

## 2013-09-16 LAB — PROTIME-INR
INR: 0.99 (ref 0.00–1.49)
PROTHROMBIN TIME: 13.1 s (ref 11.6–15.2)

## 2013-09-16 LAB — ABO/RH: ABO/RH(D): B POS

## 2013-09-16 LAB — APTT: aPTT: 28 seconds (ref 24–37)

## 2013-09-16 NOTE — Patient Instructions (Addendum)
[place]  09/16/2013   Your procedure is scheduled on:  09/17/2013  Report to [facility] Main Entrance and follow signs to Radiology at 0730am.  Then Radiology will take you to Short Stay where the nurses will get you ready.  Call this number if you have problems the morning of surgery [phone] or Presurgical Testing [phone].   Remember:  Do not eat food or drink liquids :After Midnight.     Take these medicines the morning of surgery with A SIP OF WATER: Amlodipine;Metoprolol;Pantoprazole                               You may not have any metal on your body including hair pins and piercings  Do not wear jewelry,lotions, powders, or deodorant.  . Men may shave face and neck.               Do not bring valuables to the hospital. [place].  Contacts, dentures or bridgework may not be worn into surgery.  Leave suitcase in the car. After surgery it may be brought to your room.  For patients admitted to the hospital, checkout time is 11:00 AM the day of discharge.     Special Instructions: review fact sheets for  Blood Transfusion fact sheet  Remember: Type/Screen "Blue armsbands"- may not be removed once applied(would result in being retested AM of surgery, if removed). ________________________________________________________________________  [facility] - Preparing for Surgery Before surgery, you can play an important role.  Because skin is not sterile, your skin needs to be as free of germs as possible.  You can reduce the number of germs on your skin by washing with CHG (chlorahexidine gluconate) soap before surgery.  CHG is an antiseptic cleaner which kills germs and bonds with the skin to continue killing germs even after washing. Please DO NOT use if you have an allergy to CHG or antibacterial soaps.  If your skin becomes reddened/irritated stop using the CHG and inform your nurse when you arrive at Short Stay. Do not shave  (including legs and underarms) for at least 48 hours prior to the first CHG shower.  You may shave your face/neck. Please follow these instructions carefully:  1.  Shower with CHG Soap the night before surgery and the  morning of Surgery.  2.  If you choose to wash your hair, wash your hair first as usual with your  normal  shampoo.  3.  After you shampoo, rinse your hair and body thoroughly to remove the  shampoo.                           4.  Use CHG as you would any other liquid soap.  You can apply chg directly  to the skin and wash                       Gently with a scrungie or clean washcloth.  5.  Apply the CHG Soap to your body ONLY FROM THE NECK DOWN.   Do not use on face/ open                           Wound or open sores. Avoid contact with eyes, ears mouth and genitals (private parts).  Wash face,  Genitals (private parts) with your normal soap.             6.  Wash thoroughly, paying special attention to the area where your surgery  will be performed.  7.  Thoroughly rinse your body with warm water from the neck down.  8.  DO NOT shower/wash with your normal soap after using and rinsing off  the CHG Soap.                9.  Pat yourself dry with a clean towel.            10.  Wear clean pajamas.            11.  Place clean sheets on your bed the night of your first shower and do not  sleep with pets. Day of Surgery : Do not apply any lotions/deodorants the morning of surgery.  Please wear clean clothes to the hospital/surgery center.  FAILURE TO FOLLOW THESE INSTRUCTIONS MAY RESULT IN THE CANCELLATION OF YOUR SURGERY PATIENT SIGNATURE_________________________________  NURSE SIGNATURE__________________________________  ________________________________________________________________________  WHAT IS A BLOOD TRANSFUSION? Blood Transfusion Information  A transfusion is the replacement of blood or some of its parts. Blood is made up of multiple cells which  provide different functions.  Red blood cells carry oxygen and are used for blood loss replacement.  White blood cells fight against infection.  Platelets control bleeding.  Plasma helps clot blood.  Other blood products are available for specialized needs, such as hemophilia or other clotting disorders. BEFORE THE TRANSFUSION  Who gives blood for transfusions?   Healthy volunteers who are fully evaluated to make sure their blood is safe. This is blood bank blood. Transfusion therapy is the safest it has ever been in the practice of medicine. Before blood is taken from a donor, a complete history is taken to make sure that person has no history of diseases nor engages in risky social behavior (examples are intravenous drug use or sexual activity with multiple partners). The donor's travel history is screened to minimize risk of transmitting infections, such as malaria. The donated blood is tested for signs of infectious diseases, such as HIV and hepatitis. The blood is then tested to be sure it is compatible with you in order to minimize the chance of a transfusion reaction. If you or a relative donates blood, this is often done in anticipation of surgery and is not appropriate for emergency situations. It takes many days to process the donated blood. RISKS AND COMPLICATIONS Although transfusion therapy is very safe and saves many lives, the main dangers of transfusion include:   Getting an infectious disease.  Developing a transfusion reaction. This is an allergic reaction to something in the blood you were given. Every precaution is taken to prevent this. The decision to have a blood transfusion has been considered carefully by your caregiver before blood is given. Blood is not given unless the benefits outweigh the risks. AFTER THE TRANSFUSION  Right after receiving a blood transfusion, you will usually feel much better and more energetic. This is especially true if your red blood cells  have gotten low (anemic). The transfusion raises the level of the red blood cells which carry oxygen, and this usually causes an energy increase.  The nurse administering the transfusion will monitor you carefully for complications. HOME CARE INSTRUCTIONS  No special instructions are needed after a transfusion. You may find your energy is better. Speak with your caregiver about any  limitations on activity for underlying diseases you may have. SEEK MEDICAL CARE IF:   Your condition is not improving after your transfusion.  You develop redness or irritation at the intravenous (IV) site. SEEK IMMEDIATE MEDICAL CARE IF:  Any of the following symptoms occur over the next 12 hours:  Shaking chills.  You have a temperature by mouth above 102 F (38.9 C), not controlled by medicine.  Chest, back, or muscle pain.  People around you feel you are not acting correctly or are confused.  Shortness of breath or difficulty breathing.  Dizziness and fainting.  You get a rash or develop hives.  You have a decrease in urine output.  Your urine turns a dark color or changes to pink, red, or brown. Any of the following symptoms occur over the next 10 days:  You have a temperature by mouth above 102 F (38.9 C), not controlled by medicine.  Shortness of breath.  Weakness after normal activity.  The white part of the eye turns yellow (jaundice).  You have a decrease in the amount of urine or are urinating less often.  Your urine turns a dark color or changes to pink, red, or brown. Document Released: 12/16/1999 Document Revised: 03/12/2011 Document Reviewed: 08/04/2007 [facility] Patient Information 2014 [place], [place].  _______________________________________________________________________

## 2013-09-16 NOTE — Pre-Procedure Instructions (Signed)
09-16-13 1300 labs viewable in Epic,note BMP.

## 2013-09-16 NOTE — Progress Notes (Signed)
CXR per [facility] 09/22/2012 Heart Cath 09/23/2012 per EPIC EKG per [facility] 04/28/2013 LOV Dr Burt Knack 04/28/2013

## 2013-09-17 ENCOUNTER — Encounter (HOSPITAL_COMMUNITY): Payer: Self-pay | Admitting: *Deleted

## 2013-09-17 ENCOUNTER — Ambulatory Visit (HOSPITAL_COMMUNITY)
Admission: RE | Admit: 2013-09-17 | Discharge: 2013-09-17 | Disposition: A | Discharge status: Home / Self Care | Payer: Medicare Other | Source: Ambulatory Visit | Attending: Urology | Admitting: Urology

## 2013-09-17 ENCOUNTER — Observation Stay (HOSPITAL_COMMUNITY)
Admission: RE | Admit: 2013-09-17 | Discharge: 2013-09-19 | Disposition: A | Discharge status: Home / Self Care | Payer: Medicare Other | Source: Ambulatory Visit | Attending: Urology | Admitting: Urology

## 2013-09-17 ENCOUNTER — Encounter (HOSPITAL_COMMUNITY): Payer: Medicare Other | Admitting: Certified Registered Nurse Anesthetist

## 2013-09-17 ENCOUNTER — Observation Stay (HOSPITAL_COMMUNITY): Payer: Medicare Other

## 2013-09-17 ENCOUNTER — Ambulatory Visit (HOSPITAL_COMMUNITY): Payer: Medicare Other | Admitting: Certified Registered Nurse Anesthetist

## 2013-09-17 ENCOUNTER — Encounter (HOSPITAL_COMMUNITY)
Admission: RE | Disposition: A | Discharge status: Home / Self Care | Payer: Self-pay | Source: Ambulatory Visit | Attending: Urology

## 2013-09-17 ENCOUNTER — Ambulatory Visit (HOSPITAL_COMMUNITY): Payer: Medicare Other

## 2013-09-17 VITALS — BP 145/70 | HR 52 | Temp 98.1°F | Resp 11

## 2013-09-17 DIAGNOSIS — M7732 Calcaneal spur, left foot: Secondary | ICD-10-CM

## 2013-09-17 DIAGNOSIS — Z860100 Personal history of colon polyps, unspecified: Secondary | ICD-10-CM | POA: Insufficient documentation

## 2013-09-17 DIAGNOSIS — N183 Chronic kidney disease, stage 3 unspecified: Secondary | ICD-10-CM | POA: Diagnosis not present

## 2013-09-17 DIAGNOSIS — Z7982 Long term (current) use of aspirin: Secondary | ICD-10-CM | POA: Insufficient documentation

## 2013-09-17 DIAGNOSIS — N200 Calculus of kidney: Secondary | ICD-10-CM

## 2013-09-17 DIAGNOSIS — E785 Hyperlipidemia, unspecified: Secondary | ICD-10-CM | POA: Diagnosis not present

## 2013-09-17 DIAGNOSIS — Z79899 Other long term (current) drug therapy: Secondary | ICD-10-CM | POA: Insufficient documentation

## 2013-09-17 DIAGNOSIS — M722 Plantar fascial fibromatosis: Secondary | ICD-10-CM

## 2013-09-17 DIAGNOSIS — Z951 Presence of aortocoronary bypass graft: Secondary | ICD-10-CM | POA: Insufficient documentation

## 2013-09-17 DIAGNOSIS — K219 Gastro-esophageal reflux disease without esophagitis: Secondary | ICD-10-CM | POA: Insufficient documentation

## 2013-09-17 DIAGNOSIS — I129 Hypertensive chronic kidney disease with stage 1 through stage 4 chronic kidney disease, or unspecified chronic kidney disease: Secondary | ICD-10-CM | POA: Diagnosis not present

## 2013-09-17 DIAGNOSIS — Z87891 Personal history of nicotine dependence: Secondary | ICD-10-CM | POA: Insufficient documentation

## 2013-09-17 DIAGNOSIS — I2510 Atherosclerotic heart disease of native coronary artery without angina pectoris: Secondary | ICD-10-CM | POA: Insufficient documentation

## 2013-09-17 DIAGNOSIS — I252 Old myocardial infarction: Secondary | ICD-10-CM | POA: Insufficient documentation

## 2013-09-17 HISTORY — PX: HOLMIUM LASER APPLICATION: SHX5852

## 2013-09-17 HISTORY — PX: NEPHROLITHOTOMY: SHX5134

## 2013-09-17 LAB — HEMOGLOBIN AND HEMATOCRIT, BLOOD
HCT: 40.4 % (ref 39.0–52.0)
Hemoglobin: 13.8 g/dL (ref 13.0–17.0)

## 2013-09-17 SURGERY — NEPHROLITHOTOMY PERCUTANEOUS
Anesthesia: General | Laterality: Right

## 2013-09-17 MED ORDER — ONDANSETRON HCL 4 MG/2ML IJ SOLN
INTRAMUSCULAR | Status: AC
  Filled 2013-09-17: qty 2

## 2013-09-17 MED ORDER — IOHEXOL 300 MG/ML  SOLN
INTRAMUSCULAR | Status: DC | PRN
  Administered 2013-09-17: 10 mL

## 2013-09-17 MED ORDER — PROMETHAZINE HCL 25 MG/ML IJ SOLN: 6.2500 mg | INTRAMUSCULAR | Status: DC | PRN

## 2013-09-17 MED ORDER — 0.9 % SODIUM CHLORIDE (POUR BTL) OPTIME
TOPICAL | Status: DC | PRN
  Administered 2013-09-17: 1000 mL

## 2013-09-17 MED ORDER — EPHEDRINE SULFATE 50 MG/ML IJ SOLN
INTRAMUSCULAR | Status: DC | PRN
  Administered 2013-09-17 (×2): 10 mg via INTRAVENOUS

## 2013-09-17 MED ORDER — NEOSTIGMINE METHYLSULFATE 10 MG/10ML IV SOLN
INTRAVENOUS | Status: AC
  Filled 2013-09-17: qty 1

## 2013-09-17 MED ORDER — LIDOCAINE HCL (CARDIAC) 20 MG/ML IV SOLN
INTRAVENOUS | Status: DC | PRN
  Administered 2013-09-17: 80 mg via INTRAVENOUS

## 2013-09-17 MED ORDER — PROPOFOL 10 MG/ML IV BOLUS
INTRAVENOUS | Status: AC
  Filled 2013-09-17: qty 20

## 2013-09-17 MED ORDER — MIDAZOLAM HCL 5 MG/5ML IJ SOLN
INTRAMUSCULAR | Status: DC | PRN
  Administered 2013-09-17: 1 mg via INTRAVENOUS

## 2013-09-17 MED ORDER — HYDROMORPHONE HCL 1 MG/ML IJ SOLN
INTRAMUSCULAR | Status: AC
  Filled 2013-09-17: qty 1

## 2013-09-17 MED ORDER — NEOSTIGMINE METHYLSULFATE 10 MG/10ML IV SOLN
INTRAVENOUS | Status: DC | PRN
  Administered 2013-09-17: 4 mg via INTRAVENOUS

## 2013-09-17 MED ORDER — MIDAZOLAM HCL 2 MG/2ML IJ SOLN
INTRAMUSCULAR | Status: AC
  Filled 2013-09-17: qty 6

## 2013-09-17 MED ORDER — SODIUM CHLORIDE 0.45 % IV SOLN
INTRAVENOUS | Status: DC
  Administered 2013-09-17 – 2013-09-18 (×2): via INTRAVENOUS

## 2013-09-17 MED ORDER — METOPROLOL TARTRATE 50 MG PO TABS
50.0000 mg | ORAL_TABLET | Freq: Two times a day (BID) | ORAL | Status: DC
  Administered 2013-09-17 – 2013-09-19 (×4): 50 mg via ORAL
  Filled 2013-09-17 (×5): qty 1

## 2013-09-17 MED ORDER — OXYCODONE HCL 5 MG PO TABS: 5.0000 mg | ORAL_TABLET | Freq: Once | ORAL | Status: DC | PRN

## 2013-09-17 MED ORDER — FENTANYL CITRATE 0.05 MG/ML IJ SOLN
INTRAMUSCULAR | Status: AC
  Filled 2013-09-17: qty 6

## 2013-09-17 MED ORDER — LACTATED RINGERS IV SOLN
INTRAVENOUS | Status: DC
  Administered 2013-09-17: 14:00:00 via INTRAVENOUS
  Administered 2013-09-17: 1000 mL via INTRAVENOUS

## 2013-09-17 MED ORDER — IOHEXOL 300 MG/ML  SOLN
25.0000 mL | Freq: Once | INTRAMUSCULAR | Status: AC | PRN
  Administered 2013-09-17: 25 mL

## 2013-09-17 MED ORDER — AMLODIPINE BESYLATE 2.5 MG PO TABS
2.5000 mg | ORAL_TABLET | Freq: Every morning | ORAL | Status: DC
  Administered 2013-09-18 – 2013-09-19 (×2): 2.5 mg via ORAL
  Filled 2013-09-17 (×2): qty 1

## 2013-09-17 MED ORDER — GLYCOPYRROLATE 0.2 MG/ML IJ SOLN
INTRAMUSCULAR | Status: AC
  Filled 2013-09-17: qty 3

## 2013-09-17 MED ORDER — SODIUM CHLORIDE 0.9 % IV SOLN
INTRAVENOUS | Status: DC
Start: 2013-09-17 — End: 2013-09-17
  Administered 2013-09-17: 09:00:00 via INTRAVENOUS

## 2013-09-17 MED ORDER — SUCCINYLCHOLINE CHLORIDE 20 MG/ML IJ SOLN
INTRAMUSCULAR | Status: DC | PRN
  Administered 2013-09-17: 100 mg via INTRAVENOUS

## 2013-09-17 MED ORDER — LIDOCAINE HCL (CARDIAC) 20 MG/ML IV SOLN
INTRAVENOUS | Status: AC
  Filled 2013-09-17: qty 5

## 2013-09-17 MED ORDER — CISATRACURIUM BESYLATE 20 MG/10ML IV SOLN
INTRAVENOUS | Status: AC
  Filled 2013-09-17: qty 10

## 2013-09-17 MED ORDER — DEXAMETHASONE SODIUM PHOSPHATE 10 MG/ML IJ SOLN
INTRAMUSCULAR | Status: AC
  Filled 2013-09-17: qty 1

## 2013-09-17 MED ORDER — MIDAZOLAM HCL 2 MG/2ML IJ SOLN
INTRAMUSCULAR | Status: AC | PRN
  Administered 2013-09-17: 0.5 mg via INTRAVENOUS
  Administered 2013-09-17: 1 mg via INTRAVENOUS
  Administered 2013-09-17: 0.5 mg via INTRAVENOUS

## 2013-09-17 MED ORDER — CIPROFLOXACIN IN D5W 400 MG/200ML IV SOLN
400.0000 mg | Freq: Once | INTRAVENOUS | Status: AC
  Administered 2013-09-17: 400 mg via INTRAVENOUS
  Filled 2013-09-17: qty 200

## 2013-09-17 MED ORDER — MEPERIDINE HCL 50 MG/ML IJ SOLN: 6.2500 mg | INTRAMUSCULAR | Status: DC | PRN

## 2013-09-17 MED ORDER — HYDROMORPHONE HCL 1 MG/ML IJ SOLN
0.2500 mg | INTRAMUSCULAR | Status: DC | PRN
Start: 2013-09-17 — End: 2013-09-17
  Administered 2013-09-17 (×4): 0.5 mg via INTRAVENOUS

## 2013-09-17 MED ORDER — OXYCODONE HCL 5 MG PO TABS: 5.0000 mg | ORAL_TABLET | ORAL | Status: DC | PRN

## 2013-09-17 MED ORDER — DEXAMETHASONE SODIUM PHOSPHATE 10 MG/ML IJ SOLN
INTRAMUSCULAR | Status: DC | PRN
  Administered 2013-09-17: 10 mg via INTRAVENOUS

## 2013-09-17 MED ORDER — FENTANYL CITRATE 0.05 MG/ML IJ SOLN
INTRAMUSCULAR | Status: DC | PRN
  Administered 2013-09-17 (×4): 50 ug via INTRAVENOUS

## 2013-09-17 MED ORDER — CIPROFLOXACIN IN D5W 400 MG/200ML IV SOLN
INTRAVENOUS | Status: AC
  Filled 2013-09-17: qty 200

## 2013-09-17 MED ORDER — HYDROMORPHONE HCL 1 MG/ML IJ SOLN: 0.5000 mg | INTRAMUSCULAR | Status: DC | PRN

## 2013-09-17 MED ORDER — FENTANYL CITRATE 0.05 MG/ML IJ SOLN
INTRAMUSCULAR | Status: AC
  Filled 2013-09-17: qty 5

## 2013-09-17 MED ORDER — FENTANYL CITRATE 0.05 MG/ML IJ SOLN
50.0000 ug | INTRAMUSCULAR | Status: DC | PRN
  Administered 2013-09-17: 50 ug via INTRAVENOUS

## 2013-09-17 MED ORDER — NITROGLYCERIN 0.4 MG SL SUBL: 0.4000 mg | SUBLINGUAL_TABLET | SUBLINGUAL | Status: DC | PRN

## 2013-09-17 MED ORDER — CISATRACURIUM BESYLATE (PF) 10 MG/5ML IV SOLN
INTRAVENOUS | Status: DC | PRN
  Administered 2013-09-17: 6 mg via INTRAVENOUS
  Administered 2013-09-17: 2 mg via INTRAVENOUS

## 2013-09-17 MED ORDER — PROPOFOL 10 MG/ML IV BOLUS
INTRAVENOUS | Status: DC | PRN
  Administered 2013-09-17: 130 mg via INTRAVENOUS

## 2013-09-17 MED ORDER — DOCUSATE SODIUM 100 MG PO CAPS
100.0000 mg | ORAL_CAPSULE | Freq: Two times a day (BID) | ORAL | Status: DC
  Administered 2013-09-17 – 2013-09-19 (×4): 100 mg via ORAL
  Filled 2013-09-17 (×5): qty 1

## 2013-09-17 MED ORDER — SODIUM CHLORIDE 0.9 % IR SOLN
Status: DC | PRN
  Administered 2013-09-17: 9000 mL

## 2013-09-17 MED ORDER — FENTANYL CITRATE 0.05 MG/ML IJ SOLN
INTRAMUSCULAR | Status: AC | PRN
  Administered 2013-09-17 (×2): 25 ug via INTRAVENOUS
  Administered 2013-09-17: 50 ug via INTRAVENOUS

## 2013-09-17 MED ORDER — FENTANYL CITRATE 0.05 MG/ML IJ SOLN
INTRAMUSCULAR | Status: AC
  Filled 2013-09-17: qty 2

## 2013-09-17 MED ORDER — MIDAZOLAM HCL 2 MG/2ML IJ SOLN
INTRAMUSCULAR | Status: AC
  Filled 2013-09-17: qty 2

## 2013-09-17 MED ORDER — ONDANSETRON HCL 4 MG/2ML IJ SOLN
INTRAMUSCULAR | Status: DC | PRN
  Administered 2013-09-17: 4 mg via INTRAVENOUS

## 2013-09-17 MED ORDER — ONDANSETRON HCL 4 MG/2ML IJ SOLN
4.0000 mg | INTRAMUSCULAR | Status: DC | PRN
  Administered 2013-09-17: 4 mg via INTRAVENOUS

## 2013-09-17 MED ORDER — LIDOCAINE HCL 1 % IJ SOLN
INTRAMUSCULAR | Status: AC
  Filled 2013-09-17: qty 20

## 2013-09-17 MED ORDER — ATORVASTATIN CALCIUM 40 MG PO TABS
40.0000 mg | ORAL_TABLET | Freq: Every morning | ORAL | Status: DC
  Administered 2013-09-18 – 2013-09-19 (×2): 40 mg via ORAL
  Filled 2013-09-17 (×2): qty 1

## 2013-09-17 MED ORDER — GLYCOPYRROLATE 0.2 MG/ML IJ SOLN
INTRAMUSCULAR | Status: DC | PRN
  Administered 2013-09-17: 0.6 mg via INTRAVENOUS
  Administered 2013-09-17: 0.2 mg via INTRAVENOUS

## 2013-09-17 MED ORDER — PANTOPRAZOLE SODIUM 40 MG PO TBEC
40.0000 mg | DELAYED_RELEASE_TABLET | Freq: Every day | ORAL | Status: DC
  Administered 2013-09-18 – 2013-09-19 (×2): 40 mg via ORAL
  Filled 2013-09-17 (×2): qty 1

## 2013-09-17 MED ORDER — CEPHALEXIN 500 MG PO CAPS
500.0000 mg | ORAL_CAPSULE | Freq: Three times a day (TID) | ORAL | Status: DC
  Administered 2013-09-17 – 2013-09-19 (×6): 500 mg via ORAL
  Filled 2013-09-17 (×8): qty 1

## 2013-09-17 MED ORDER — OXYCODONE HCL 5 MG/5ML PO SOLN
5.0000 mg | Freq: Once | ORAL | Status: DC | PRN
  Filled 2013-09-17: qty 5

## 2013-09-17 SURGICAL SUPPLY — 47 items
APL SKNCLS STERI-STRIP NONHPOA (GAUZE/BANDAGES/DRESSINGS) ×2
BAG URINE DRAINAGE (UROLOGICAL SUPPLIES) ×2 IMPLANT
BASKET ZERO TIP NITINOL 2.4FR (BASKET) ×2 IMPLANT
BENZOIN TINCTURE PRP APPL 2/3 (GAUZE/BANDAGES/DRESSINGS) ×4 IMPLANT
BLADE SURG 15 STRL LF DISP TIS (BLADE) ×1 IMPLANT
BLADE SURG 15 STRL SS (BLADE) ×2
BSKT STON RTRVL ZERO TP 2.4FR (BASKET) ×1
CARTRIDGE STONEBREAK CO2 KIDNE (ELECTROSURGICAL) ×1 IMPLANT
CATCHER STONE W/TUBE ADAPTER (UROLOGICAL SUPPLIES) ×1 IMPLANT
CATH DILATION NEPHR BALL 15X10 (CATHETERS) ×1 IMPLANT
CATH FOLEY 2W COUNCIL 20FR 5CC (CATHETERS) IMPLANT
CATH MULTI PURPOSE 16FR DRAIN (STENTS) ×1 IMPLANT
CATH ROBINSON RED A/P 20FR (CATHETERS) IMPLANT
CATH X-FORCE N30 NEPHROSTOMY (TUBING) ×2 IMPLANT
COVER SURGICAL LIGHT HANDLE (MISCELLANEOUS) ×2 IMPLANT
DRAPE C-ARM 42X120 X-RAY (DRAPES) ×2 IMPLANT
DRAPE CAMERA CLOSED 9X96 (DRAPES) ×2 IMPLANT
DRAPE LINGEMAN PERC (DRAPES) ×2 IMPLANT
DRAPE SURG IRRIG POUCH 19X23 (DRAPES) ×2 IMPLANT
DRSG PAD ABDOMINAL 8X10 ST (GAUZE/BANDAGES/DRESSINGS) ×3 IMPLANT
DRSG TEGADERM 8X12 (GAUZE/BANDAGES/DRESSINGS) ×2 IMPLANT
FIBER LASER FLEXIVA 1000 (UROLOGICAL SUPPLIES) ×1 IMPLANT
FIBER LASER FLEXIVA 550 (UROLOGICAL SUPPLIES) IMPLANT
GAUZE SPONGE 4X4 12PLY STRL (GAUZE/BANDAGES/DRESSINGS) ×2 IMPLANT
GLOVE BIOGEL M 8.0 STRL (GLOVE) ×2 IMPLANT
GOWN STRL REUS W/TWL XL LVL3 (GOWN DISPOSABLE) ×2 IMPLANT
GUIDEWIRE AMPLAZ .035X145 (WIRE) ×6 IMPLANT
KIT BASIN OR (CUSTOM PROCEDURE TRAY) ×2 IMPLANT
MANIFOLD NEPTUNE II (INSTRUMENTS) ×2 IMPLANT
NS IRRIG 1000ML POUR BTL (IV SOLUTION) ×2 IMPLANT
PACK BASIC VI WITH GOWN DISP (CUSTOM PROCEDURE TRAY) ×2 IMPLANT
PROBE KIDNEY STONEBRKR 2.0X425 (ELECTROSURGICAL) ×4 IMPLANT
PROBE LITHOCLAST ULTRA 3.8X403 (UROLOGICAL SUPPLIES) IMPLANT
PROBE PNEUMATIC 1.0MMX570MM (UROLOGICAL SUPPLIES) ×2 IMPLANT
SET IRRIG Y TYPE TUR BLADDER L (SET/KITS/TRAYS/PACK) ×2 IMPLANT
SET WARMING FLUID IRRIGATION (MISCELLANEOUS) ×2 IMPLANT
SPONGE LAP 4X18 X RAY DECT (DISPOSABLE) ×2 IMPLANT
STONE CATCHER W/TUBE ADAPTER (UROLOGICAL SUPPLIES) ×2 IMPLANT
SUT SILK 2 0 30  PSL (SUTURE) ×1
SUT SILK 2 0 30 PSL (SUTURE) ×1 IMPLANT
SYR 20CC LL (SYRINGE) ×4 IMPLANT
SYRINGE 10CC LL (SYRINGE) ×2 IMPLANT
TRAY FOLEY BAG SILVER LF 14FR (CATHETERS) ×2 IMPLANT
TRAY FOLEY CATH 14FRSI W/METER (CATHETERS) ×2 IMPLANT
TUBE CONNECTING VINYL 14FR 30C (MISCELLANEOUS) ×1 IMPLANT
TUBING CONNECTING 10 (TUBING) ×6 IMPLANT
WATER STERILE IRR 1500ML POUR (IV SOLUTION) ×2 IMPLANT

## 2013-09-17 NOTE — Progress Notes (Signed)
Hgb. 13.8- Hct. 40.4- results called to Dr. Diona Fanti- Portable chest x-ray results called to Dr. Diona Fanti also.

## 2013-09-17 NOTE — Progress Notes (Signed)
Portable upright chest x-ray done. 

## 2013-09-17 NOTE — H&P (Signed)
Agree.  For right perc access and nephrolithotomy today.

## 2013-09-17 NOTE — Transfer of Care (Signed)
Immediate Anesthesia Transfer of Care Note  Patient: [name]  Procedure(s) Performed: Procedure(s) (LRB): NEPHROLITHOTOMY PERCUTANEOUS (Right) HOLMIUM LASER APPLICATION (Right)  Patient Location: PACU  Anesthesia Type: General  Level of Consciousness: sedated, patient cooperative and responds to stimulation  Airway & Oxygen Therapy: Patient Spontanous Breathing and Patient connected to face mask oxgen  Post-op Assessment: Report given to PACU RN and Post -op Vital signs reviewed and stable  Post vital signs: Reviewed and stable  Complications: No apparent anesthesia complications

## 2013-09-17 NOTE — H&P (Signed)
Chief Complaint: kidney stones   Referring Physician(s): Dr. Alan Ripper  History of Present Illness: [name] is a 68 y.o. male with history of stage III CKD and nephrolithiasis, more symptomatic on right, who presents today for right percutaneous nephrostomy prior to nephrolithotomy.                                                                                                                                                                                                                                        Past Medical History  Diagnosis Date  . CAD (coronary artery disease)     a. CABG 1993;  b. PCI of SVG to OM Jan 2012 (Drug-eluting stent x 2);  c. 09/2012 Cath/PCI: LM nl, LAD sev dzs, LCX 100, RCA patent stent, VG->AM 100, LIMA->LAD nl, VG->OM 95p ISR (PTCA only), patent distal stent.  . History of nephrolithiasis   . Stricture and stenosis of esophagus   . Degeneration of lumbar or lumbosacral intervertebral disc   . Hyperlipidemia   . Hypertension   . Anemia   . Blood transfusion   . GERD (gastroesophageal reflux disease)   . Hyperplastic colon polyp   . CKD (chronic kidney disease), stage III   . Complication of anesthesia   . PONV (postoperative nausea and vomiting)   . Myocardial infarction     hx of five   . H/O hiatal hernia   . [id number](532.9)     Past Surgical History  Procedure Laterality Date  . Coronary artery bypass graft  1993  . Lithotripsy    . Angioplasty    . Colonoscopy    . Lumbar back    . Back surgery    . Cardiac catheterization      Allergies: Review of patient's allergies indicates no known allergies.  Medications: Prior to Admission medications   Medication Sig Start Date End Date Taking? Authorizing Provider  amLODipine (NORVASC) 2.5 MG tablet Take 2.5 mg by mouth every morning.   Yes Historical Provider, MD  atorvastatin (LIPITOR) 40 MG tablet Take 40 mg by mouth every morning.   Yes Historical Provider, MD  metoprolol  (LOPRESSOR) 50 MG tablet Take 1 tablet (50 mg total) by mouth 2 (two) times daily. 10/14/12  Yes Minus Breeding, MD  pantoprazole (PROTONIX) 40 MG tablet Take 1 tablet (40 mg total) by mouth daily. 09/09/12  Yes Sherren Mocha, MD  aspirin 81 MG tablet Take 81 mg by mouth daily.     Historical Provider, MD  clopidogrel (PLAVIX) 75 MG tablet Take 75 mg by mouth at bedtime.    Historical Provider, MD  ferrous sulfate 325 (65 FE) MG tablet Take 325 mg by mouth every morning.     Historical Provider, MD  nitroGLYCERIN (NITROSTAT) 0.4 MG SL tablet Place 1 tablet (0.4 mg total) under the tongue every 5 (five) minutes as needed for chest pain. 10/14/12   Sherren Mocha, MD    Family History  Problem Relation Age of Onset  . Coronary artery disease Mother 34    deceased.status post CABG  . Lung cancer Brother   . Lung cancer Sister   . CAD Other 39    5 brothers  . CAD Other 27    4 sisters  . Kidney cancer Mother   . Colon cancer Neg Hx     History   Social History  . Marital Status: Married    Spouse Name: N/A    Number of Children: 2  . Years of Education: N/A   Occupational History  . Retired     Chemical engineer   Social History Main Topics  . Smoking status: Former Smoker    Quit date: 01/06/1987  . Smokeless tobacco: Former Systems developer  . Alcohol Use: No  . Drug Use: No  . Sexual Activity: None   Other Topics Concern  . None   Social History Narrative  . None         Review of Systems   Constitutional: Negative for fever and chills.  Respiratory: Negative for cough and shortness of breath.   Cardiovascular: Negative for chest pain.  Gastrointestinal: Negative for nausea, vomiting, abdominal pain and blood in stool.  Genitourinary: Positive for flank pain. Negative for dysuria and hematuria.  Musculoskeletal: Positive for back pain.  Neurological: Negative for headaches.  Hematological: Does not bruise/bleed easily.    Vital Signs: Ht 5\' 5"  (1.651 m)  Wt 184 lb  (83.462 kg)  BMI 30.62 kg/m2  BP 129/72  HR 55  R 18  O2 SATS 100% RA  TEMP 98.1  Physical Exam  Constitutional: He is oriented to person, place, and time. He appears well-developed and well-nourished.  Cardiovascular: Regular rhythm.   bradycardic  Pulmonary/Chest: Effort normal and breath sounds normal.  Abdominal: Soft. Bowel sounds are normal. There is no tenderness.  Mild rt CVA tenderness  Musculoskeletal: Normal range of motion. He exhibits no edema.  Neurological: He is alert and oriented to person, place, and time.    Imaging: No results found.  Labs: Lab Results  Component Value Date   WBC 6.2 09/16/2013   HCT 44.5 09/16/2013   MCV 91.4 09/16/2013   PLT 165 09/16/2013   NA 140 09/16/2013   K 4.5 09/16/2013   CL 105 09/16/2013   CO2 26 09/16/2013   GLUCOSE 130* 09/16/2013   BUN 15 09/16/2013   CREATININE 1.37* 09/16/2013   CALCIUM 9.5 09/16/2013   PROT 6.1 06/07/2010   ALBUMIN 2.8* 06/07/2010   AST 16 06/07/2010   ALT 11 06/07/2010   ALKPHOS 73 06/07/2010   BILITOT 0.2* 06/07/2010   GFRNONAA 52* 09/16/2013   GFRAA 60* 09/16/2013   INR 0.99 09/16/2013    Assessment and Plan: [name] is a 68 y.o. male with history of nephrolithiasis, more symptomatic on right, who presents today for right percutaneous nephrostomy prior to nephrolithotomy.   Details/risks of procedure d/w pt/wife  with their understanding and consent.     Rowe Robert,  PAC

## 2013-09-17 NOTE — Procedures (Signed)
Procedure:  Percutaneous access of right renal collecting system with placement of ureteral catheter Indication:  Access for perc nephrolithotomy for right renal calculi Findings:  Dominant UP and smaller LP calculi Access obtained via LP collecting system with 5 Fr catheter advanced into bladder. For OR nephrolithotomy with Dr. Diona Fanti later this AM.

## 2013-09-17 NOTE — Anesthesia Postprocedure Evaluation (Signed)
Anesthesia Post Note  Patient: [name]  Procedure(s) Performed: Procedure(s) (LRB): NEPHROLITHOTOMY PERCUTANEOUS (Right) HOLMIUM LASER APPLICATION (Right)  Anesthesia type: General  Patient location: PACU  Post pain: Pain level controlled  Post assessment: Post-op Vital signs reviewed  Last Vitals: BP 130/73  Pulse 56  Temp(Src) 36.3 C  Resp 12  Ht 5\' 5"  (1.651 m)  Wt 184 lb (83.462 kg)  BMI 30.62 kg/m2  SpO2 94%  Post vital signs: Reviewed  Level of consciousness: sedated  Complications: No apparent anesthesia complications

## 2013-09-17 NOTE — Interval H&P Note (Signed)
History and Physical Interval Note:  09/17/2013 12:39 PM  [name]  has presented today for surgery, with the diagnosis of right renal calculi  The various methods of treatment have been discussed with the patient and family. After consideration of risks, benefits and other options for treatment, the patient has consented to  Procedure(s): NEPHROLITHOTOMY PERCUTANEOUS (Right) HOLMIUM LASER APPLICATION (Right) as a surgical intervention .  The patient's history has been reviewed, patient examined, no change in status, stable for surgery.  I have reviewed the patient's chart and labs.  Questions were answered to the patient's satisfaction.     Jorja Loa

## 2013-09-17 NOTE — Progress Notes (Signed)
Hgb. And Hct. Drawn by lab. 

## 2013-09-17 NOTE — Anesthesia Preprocedure Evaluation (Signed)
Anesthesia Evaluation  Patient identified by MRN, date of birth, ID band Patient awake    Reviewed: Allergy & Precautions, H&P , NPO status , Patient's Chart, lab work & pertinent test results  History of Anesthesia Complications (+) PONV and history of anesthetic complications  Airway Mallampati: II TM Distance: >3 FB Neck ROM: Full    Dental no notable dental hx.    Pulmonary neg pulmonary ROS, former smoker,  breath sounds clear to auscultation  Pulmonary exam normal       Cardiovascular hypertension, Pt. on medications + angina + CAD and + Past MI Rhythm:Regular Rate:Normal     Neuro/Psych  Headaches, negative psych ROS   GI/Hepatic Neg liver ROS, hiatal hernia, GERD-  ,  Endo/Other  negative endocrine ROS  Renal/GU CRFRenal disease     Musculoskeletal  (+) Arthritis -,   Abdominal   Peds  Hematology  (+) Blood dyscrasia, anemia ,   Anesthesia Other Findings   Reproductive/Obstetrics negative OB ROS                           Anesthesia Physical Anesthesia Plan  ASA: III  Anesthesia Plan: General   Post-op Pain Management:    Induction: Intravenous  Airway Management Planned: Oral ETT  Additional Equipment:   Intra-op Plan:   Post-operative Plan: Extubation in OR  Informed Consent: I have reviewed the patients History and Physical, chart, labs and discussed the procedure including the risks, benefits and alternatives for the proposed anesthesia with the patient or authorized representative who has indicated his/her understanding and acceptance.   Dental advisory given  Plan Discussed with: CRNA  Anesthesia Plan Comments:         Anesthesia Quick Evaluation

## 2013-09-18 ENCOUNTER — Observation Stay (HOSPITAL_COMMUNITY): Payer: Medicare Other

## 2013-09-18 ENCOUNTER — Encounter (HOSPITAL_COMMUNITY): Payer: Self-pay | Admitting: Urology

## 2013-09-18 DIAGNOSIS — N200 Calculus of kidney: Secondary | ICD-10-CM | POA: Diagnosis not present

## 2013-09-18 LAB — HEMOGLOBIN AND HEMATOCRIT, BLOOD
HEMATOCRIT: 35.6 % — AB (ref 39.0–52.0)
Hemoglobin: 11.8 g/dL — ABNORMAL LOW (ref 13.0–17.0)

## 2013-09-18 MED ORDER — CEPHALEXIN 500 MG PO CAPS: 500.0000 mg | ORAL_CAPSULE | Freq: Two times a day (BID) | ORAL | Status: DC

## 2013-09-18 MED ORDER — IOHEXOL 300 MG/ML  SOLN
50.0000 mL | Freq: Once | INTRAMUSCULAR | Status: AC | PRN
  Administered 2013-09-18: 20 mL

## 2013-09-18 NOTE — Progress Notes (Signed)
1 Day Post-Op Subjective: Patient reports he is feeling much better. Less nausea than last night. He has not needed much pain medicine overnight. Nephrostomy urinary output is good, decreasing amount of blood.  Objective: Vital signs in last 24 hours: Temp:  [97.3 F (36.3 C)-98.1 F (36.7 C)] 97.8 F (36.6 C) (09/17 2118) Pulse Rate:  [50-92] 59 (09/17 2118) Resp:  [9-19] 16 (09/17 2118) BP: (127-189)/(67-118) 127/67 mmHg (09/17 2118) SpO2:  [94 %-100 %] 98 % (09/17 2118) Weight:  [83.462 kg (184 lb)] 83.462 kg (184 lb) (09/17 1652)  Intake/Output from previous day: 09/17 0701 - 09/18 0700 In: 2661.3 [I.V.:2661.3] Out: 3016 [Urine:1045; Blood:150] Intake/Output this shift:    Physical Exam:  Constitutional: Vital signs reviewed. WD WN in NAD   Eyes: PERRL, No scleral icterus.   Pulmonary/Chest: Normal effort Abdominal: Soft. Non-tender, non-distended, bowel sounds are normal, no masses, organomegaly, or guarding present.    Lab Results:  Recent Labs  09/16/13 1215 09/17/13 1546 09/18/13 0400  HGB 15.3 13.8 11.8*  HCT 44.5 40.4 35.6*   BMET  Recent Labs  09/16/13 1215  NA 140  K 4.5  CL 105  CO2 26  GLUCOSE 130*  BUN 15  CREATININE 1.37*  CALCIUM 9.5    Recent Labs  09/16/13 1215  INR 0.99   No results found for this basename: LABURIN,  in the last 72 hours Results for orders placed during the hospital encounter of 01/07/10  MRSA PCR SCREENING     Status: None   Collection Time    01/08/10  4:17 AM      Result Value Ref Range Status   MRSA by PCR    NEGATIVE Final   Value: NEGATIVE            The GeneXpert MRSA Assay (FDA     approved for NASAL specimens     only), is one component of a     comprehensive MRSA colonization     surveillance program. It is not     intended to diagnose MRSA     infection nor to guide or     monitor treatment for     MRSA infections.    Studies/Results: Ir US Guide Bx Asp/drain  09/17/2013   CLINICAL DATA:   Right-sided renal calculi. The patient requires percutaneous ureteral access prior to scheduled operative nephrolithotomy.  EXAM: 1. ULTRASOUND GUIDANCE FOR PUNCTURE OF THE RIGHT RENAL COLLECTING SYSTEM. 2. RIGHT PERCUTANEOUS URETERAL CATHETER PLACEMENT.  COMPARISON:  Abdominal film on 08/24/2013.  ANESTHESIA/SEDATION: 2.0 mg IV Versed; 100 mcg IV Fentanyl.  Total Moderate Sedation Time  22 minutes  CONTRAST:  25 ml Omnipaque 300  MEDICATIONS: 400 mg IV Cipro. Ciprofloxacin was given within two hours of incision.  FLUOROSCOPY TIME:  3 minutes and 6 seconds.  PROCEDURE: The procedure, risks, benefits, and alternatives were explained to the patient. Questions regarding the procedure were encouraged and answered. The patient understands and consents to the procedure. A time-out procedure was performed.  The right flank region was prepped with Betadine in a sterile fashion, and a sterile drape was applied covering the operative field. A sterile gown and sterile gloves were used for the procedure. Local anesthesia was provided with 1% Lidocaine.  Ultrasound was used to localize the right kidney. Under direct ultrasound guidance, a 21 gauge needle was advanced into the renal collecting system. Ultrasound image documentation was performed. Aspiration of urine sample was performed followed by contrast injection.  A second puncture of the  right renal collecting system was then performed under fluoroscopy with a second 21 gauge needle. Aspiration of urine was followed by injection of contrast. A guidewire was then advanced into the collecting system. A transitional dilator was placed. A guidewire was advanced. A 5 French catheter was then advanced over the wire. Catheter access was then obtained into the ureter and the catheter advanced into the bladder.  The catheter was secured at the skin with a silk retention suture and capped.  COMPLICATIONS: None.  FINDINGS: Initial fluoroscopy demonstrates dominant upper pole calculus  and a smaller lower pole calculus. Initial upper pole access was performed under ultrasound guidance, allowing opacification of the collecting system. Lower pole access was then obtained and a catheter advanced through the ureter and into the bladder. This access will be utilized for tract dilatation during nephrolithotomy.  IMPRESSION: Percutaneous right-sided renal access and placement of ureteral catheter prior to operative nephrolithotomy.   Electronically Signed   By: Aletta Edouard M.D.   On: 09/17/2013 11:22   Portable Chest 1 View  09/17/2013   CLINICAL DATA:  Shortness of breath and hypotension postoperative only. Possible pneumothorax  EXAM: PORTABLE CHEST - 1 VIEW  COMPARISON:  .  PA and lateral chest x-ray of September 22, 2012  FINDINGS: The lungs are borderline hypoinflated. There are bibasilar atelectatic changes. There are air bronchograms in the medial aspects of the lower lobes posteriorly. There is no pneumothorax. No significant pleural effusion is demonstrated. The cardiopericardial silhouette is mildly enlarged the pulmonary vascularity is normal. There are 8 intact sternal wires present.  IMPRESSION: There is bibasilar atelectasis and likely bibasilar pneumonia. There is no evidence of CHF. The patient has undergone previous CABG.   Electronically Signed   By: David  Martin[place]   On: 09/17/2013 15:37   Dg C-arm 61-120 Min-no Report  09/17/2013   CLINICAL DATA: stone   C-ARM 61-120 MINUTES  Fluoroscopy was utilized by the requesting physician.  No radiographic  interpretation.    Ir Oris Drone Cath Perc Right  09/17/2013   CLINICAL DATA:  Right-sided renal calculi. The patient requires percutaneous ureteral access prior to scheduled operative nephrolithotomy.  EXAM: 1. ULTRASOUND GUIDANCE FOR PUNCTURE OF THE RIGHT RENAL COLLECTING SYSTEM. 2. RIGHT PERCUTANEOUS URETERAL CATHETER PLACEMENT.  COMPARISON:  Abdominal film on 08/24/2013.  ANESTHESIA/SEDATION: 2.0 mg IV Versed; 100 mcg IV  Fentanyl.  Total Moderate Sedation Time  22 minutes  CONTRAST:  25 ml Omnipaque 300  MEDICATIONS: 400 mg IV Cipro. Ciprofloxacin was given within two hours of incision.  FLUOROSCOPY TIME:  3 minutes and 6 seconds.  PROCEDURE: The procedure, risks, benefits, and alternatives were explained to the patient. Questions regarding the procedure were encouraged and answered. The patient understands and consents to the procedure. A time-out procedure was performed.  The right flank region was prepped with Betadine in a sterile fashion, and a sterile drape was applied covering the operative field. A sterile gown and sterile gloves were used for the procedure. Local anesthesia was provided with 1% Lidocaine.  Ultrasound was used to localize the right kidney. Under direct ultrasound guidance, a 21 gauge needle was advanced into the renal collecting system. Ultrasound image documentation was performed. Aspiration of urine sample was performed followed by contrast injection.  A second puncture of the right renal collecting system was then performed under fluoroscopy with a second 21 gauge needle. Aspiration of urine was followed by injection of contrast. A guidewire was then advanced into the collecting system. A transitional dilator  was placed. A guidewire was advanced. A 5 French catheter was then advanced over the wire. Catheter access was then obtained into the ureter and the catheter advanced into the bladder.  The catheter was secured at the skin with a silk retention suture and capped.  COMPLICATIONS: None.  FINDINGS: Initial fluoroscopy demonstrates dominant upper pole calculus and a smaller lower pole calculus. Initial upper pole access was performed under ultrasound guidance, allowing opacification of the collecting system. Lower pole access was then obtained and a catheter advanced through the ureter and into the bladder. This access will be utilized for tract dilatation during nephrolithotomy.  IMPRESSION:  Percutaneous right-sided renal access and placement of ureteral catheter prior to operative nephrolithotomy.   Electronically Signed   By: Aletta Edouard M.D.   On: 09/17/2013 11:22    Assessment/Plan:   Postop day #1 right percutaneous nephrolithotomy. He is doing well. Urine from nephrostomy draining adequately.    Will check KUB this morning, nephrostogram later this day. Possible discharge later today or tomorrow   LOS: 1 day   Franchot Gallo M 09/18/2013, 7:47 AM

## 2013-09-18 NOTE — Progress Notes (Signed)
Utilization Review Completed.[name] T9/18/2015  

## 2013-09-18 NOTE — Progress Notes (Signed)
Pt's nephrostogram revealed inadequate flow into ureter. Will send home w/ PCN tube and bring in next week for repeat n-gram. OK to go home in am if stable. He has pain med at home. I told him to r/s asa, plavix when urine clear.

## 2013-09-19 DIAGNOSIS — N200 Calculus of kidney: Secondary | ICD-10-CM | POA: Diagnosis not present

## 2013-09-19 LAB — HEMOGLOBIN AND HEMATOCRIT, BLOOD
HEMATOCRIT: 37.4 % — AB (ref 39.0–52.0)
HEMOGLOBIN: 12.2 g/dL — AB (ref 13.0–17.0)

## 2013-09-19 NOTE — Progress Notes (Signed)
Discharge instructions and nephrostomy care done utilizing teach back method. No questions at this time. Patient being discharged to home.

## 2013-09-19 NOTE — Discharge Summary (Signed)
Physician Discharge Summary  Patient ID: [name] MRN: [id number] DOB/AGE: Dec 05, 1945 68 y.o.  Admit date: 09/17/2013 Discharge date: 09/19/2013  Admission Diagnoses:  Discharge Diagnoses:  Active Problems:   Calculus of kidney   Discharged Condition: stable  Hospital Course: Patient underwent right PCNL on 9/17. Tolerated well. Recovered without complications. Diet was advanced and pain was controlled. Was tolerating regular food. Voiding without difficulty. Antegrade nephrostogram demonstrated inadequate flow so PCN was left in place for discharge.   Consults: None  Significant Diagnostic Studies: Right antegrade nephrostogram on 9/18.   Treatments: surgery: R PCNL  Discharge Exam: Blood pressure 127/65, pulse 67, temperature 98.5 F (36.9 C), temperature source Oral, resp. rate 18, height 5\' 5"  (1.651 m), weight 83.462 kg (184 lb), SpO2 93.00%. AAOx3, in NAD. normal breathing, PCN with clear/pink drainage. Abdomen soft, NT/ND. Extremities WWP without edema.  Disposition: 01-Home or Self Care     Medication List    STOP taking these medications       clopidogrel 75 MG tablet  Commonly known as:  PLAVIX      TAKE these medications       amLODipine 2.5 MG tablet  Commonly known as:  NORVASC  Take 2.5 mg by mouth every morning.     aspirin 81 MG tablet  Take 81 mg by mouth daily.     atorvastatin 40 MG tablet  Commonly known as:  LIPITOR  Take 40 mg by mouth every morning.     cephALEXin 500 MG capsule  Commonly known as:  KEFLEX  Take 1 capsule (500 mg total) by mouth 2 (two) times daily.     ferrous sulfate 325 (65 FE) MG tablet  Take 325 mg by mouth every morning.     metoprolol 50 MG tablet  Commonly known as:  LOPRESSOR  Take 1 tablet (50 mg total) by mouth 2 (two) times daily.     nitroGLYCERIN 0.4 MG SL tablet  Commonly known as:  NITROSTAT  Place 1 tablet (0.4 mg total) under the tongue every 5 (five) minutes as needed for chest pain.     pantoprazole 40 MG tablet  Commonly known as:  PROTONIX  Take 1 tablet (40 mg total) by mouth daily.           Follow-up Information   Follow up with Jorja Loa, MD On 10/16/2013. (2:45)    Specialty:  Urology   Contact information:   [place] [id number] [phone]       Signed: [name] 09/19/2013, 8:43 AM

## 2013-09-19 NOTE — Progress Notes (Signed)
UR completed 

## 2013-09-19 NOTE — Progress Notes (Signed)
2 Days Post-Op Subjective: No issues or complaints.   Objective: Vital signs in last 24 hours: Temp:  [98.2 F (36.8 C)-98.5 F (36.9 C)] 98.5 F (36.9 C) (09/19 0619) Pulse Rate:  [63-67] 67 (09/19 0619) Resp:  [18] 18 (09/19 0619) BP: (117-127)/(50-65) 127/65 mmHg (09/19 0619) SpO2:  [93 %-95 %] 93 % (09/19 0619)  Intake/Output from previous day: 09/18 0701 - 09/19 0700 In: 1017.5 [P.O.:840; I.V.:177.5] Out: 2975 [Urine:2975] Intake/Output this shift:    Physical Exam:  General: Alert and oriented CV: RRR Lungs: Clear Abdomen: Soft, ND Neph tube: in place, clear/pink Ext: NT, No erythema  Lab Results:  Recent Labs  09/16/13 1215 09/17/13 1546 09/18/13 0400  HGB 15.3 13.8 11.8*  HCT 44.5 40.4 35.6*   BMET  Recent Labs  09/16/13 1215  NA 140  K 4.5  CL 105  CO2 26  GLUCOSE 130*  BUN 15  CREATININE 1.37*  CALCIUM 9.5     Studies/Results: Dg Abd 1 View  09/18/2013   CLINICAL DATA:  Postop nephrolithotomy.  EXAM: ABDOMEN - 1 VIEW  COMPARISON:  Images from procedure performed yesterday. One view abdomen 08/24/2013.  FINDINGS: Large caliber right percutaneous nephrostomy overlies the right kidney. The right renal stone burden appears decreased. No new calcifications are seen along the expected course of the right ureter. Bilateral pelvic calcifications and calcifications over the left kidney are stable. Patient is status post lower lumbar fusion. Ossification of the right iliolumbar ligament noted.  IMPRESSION: Percutaneous nephrostomy in place with decreased right renal stone burden. No ureteral calculi identified.   Electronically Signed   By: Camie Patience M.D.   On: 09/18/2013 08:26   Ir US Guide Bx Asp/drain  09/17/2013   CLINICAL DATA:  Right-sided renal calculi. The patient requires percutaneous ureteral access prior to scheduled operative nephrolithotomy.  EXAM: 1. ULTRASOUND GUIDANCE FOR PUNCTURE OF THE RIGHT RENAL COLLECTING SYSTEM. 2. RIGHT  PERCUTANEOUS URETERAL CATHETER PLACEMENT.  COMPARISON:  Abdominal film on 08/24/2013.  ANESTHESIA/SEDATION: 2.0 mg IV Versed; 100 mcg IV Fentanyl.  Total Moderate Sedation Time  22 minutes  CONTRAST:  25 ml Omnipaque 300  MEDICATIONS: 400 mg IV Cipro. Ciprofloxacin was given within two hours of incision.  FLUOROSCOPY TIME:  3 minutes and 6 seconds.  PROCEDURE: The procedure, risks, benefits, and alternatives were explained to the patient. Questions regarding the procedure were encouraged and answered. The patient understands and consents to the procedure. A time-out procedure was performed.  The right flank region was prepped with Betadine in a sterile fashion, and a sterile drape was applied covering the operative field. A sterile gown and sterile gloves were used for the procedure. Local anesthesia was provided with 1% Lidocaine.  Ultrasound was used to localize the right kidney. Under direct ultrasound guidance, a 21 gauge needle was advanced into the renal collecting system. Ultrasound image documentation was performed. Aspiration of urine sample was performed followed by contrast injection.  A second puncture of the right renal collecting system was then performed under fluoroscopy with a second 21 gauge needle. Aspiration of urine was followed by injection of contrast. A guidewire was then advanced into the collecting system. A transitional dilator was placed. A guidewire was advanced. A 5 French catheter was then advanced over the wire. Catheter access was then obtained into the ureter and the catheter advanced into the bladder.  The catheter was secured at the skin with a silk retention suture and capped.  COMPLICATIONS: None.  FINDINGS: Initial fluoroscopy demonstrates dominant  upper pole calculus and a smaller lower pole calculus. Initial upper pole access was performed under ultrasound guidance, allowing opacification of the collecting system. Lower pole access was then obtained and a catheter advanced  through the ureter and into the bladder. This access will be utilized for tract dilatation during nephrolithotomy.  IMPRESSION: Percutaneous right-sided renal access and placement of ureteral catheter prior to operative nephrolithotomy.   Electronically Signed   By: Aletta Edouard M.D.   On: 09/17/2013 11:22   Portable Chest 1 View  09/17/2013   CLINICAL DATA:  Shortness of breath and hypotension postoperative only. Possible pneumothorax  EXAM: PORTABLE CHEST - 1 VIEW  COMPARISON:  .  PA and lateral chest x-ray of September 22, 2012  FINDINGS: The lungs are borderline hypoinflated. There are bibasilar atelectatic changes. There are air bronchograms in the medial aspects of the lower lobes posteriorly. There is no pneumothorax. No significant pleural effusion is demonstrated. The cardiopericardial silhouette is mildly enlarged the pulmonary vascularity is normal. There are 8 intact sternal wires present.  IMPRESSION: There is bibasilar atelectasis and likely bibasilar pneumonia. There is no evidence of CHF. The patient has undergone previous CABG.   Electronically Signed   By: Daymien Goth  [place]   On: 09/17/2013 15:37   Dg Nephrostogram Right  09/18/2013   CLINICAL DATA:  Ureteral/ renal pelvis patency.  EXAM: RIGHT NEPHROSTOGRAM  TECHNIQUE: I injected the right nephrostomy tube with approximately 20 cc of Omnipaque 300  CONTRAST:  19mL OMNIPAQUE IOHEXOL 300 MG/ML  SOLN  FLUOROSCOPY TIME:  0 min, 53 seconds  COMPARISON:  09/18/2013 abdominal radiograph.  FINDINGS: Filling defects noted in the collecting system specially along the UPJ. There is only dilute filling of the UPJ. Only very small trickle of contrast was noted to extend into the upper ureter, which appeared irregular. Given the contained appearance of the tiny trickle of contrast extending down towards the pelvis, I do favor that this is probably in the ureter and not extravasated.  Injection of additional contrast with gentle pressure lead to  intravasation of contrast into the renal vein.  IMPRESSION: 1. Small filling defects in the right collecting system and right UPJ, possibly low-density stones or clot. Only a tiny trickle of contrast was noted to extend on the ureter, which is not really patent. Gentle pressure during injection resulted in intravasation of contrast medium into the renal vein.   Electronically Signed   By: Sherryl Barters M.D.   On: 09/18/2013 16:45   Dg C-arm 61-120 Min-no Report  09/17/2013   CLINICAL DATA: stone   C-ARM 61-120 MINUTES  Fluoroscopy was utilized by the requesting physician.  No radiographic  interpretation.    Ir Oris Drone Cath Perc Right  09/17/2013   CLINICAL DATA:  Right-sided renal calculi. The patient requires percutaneous ureteral access prior to scheduled operative nephrolithotomy.  EXAM: 1. ULTRASOUND GUIDANCE FOR PUNCTURE OF THE RIGHT RENAL COLLECTING SYSTEM. 2. RIGHT PERCUTANEOUS URETERAL CATHETER PLACEMENT.  COMPARISON:  Abdominal film on 08/24/2013.  ANESTHESIA/SEDATION: 2.0 mg IV Versed; 100 mcg IV Fentanyl.  Total Moderate Sedation Time  22 minutes  CONTRAST:  25 ml Omnipaque 300  MEDICATIONS: 400 mg IV Cipro. Ciprofloxacin was given within two hours of incision.  FLUOROSCOPY TIME:  3 minutes and 6 seconds.  PROCEDURE: The procedure, risks, benefits, and alternatives were explained to the patient. Questions regarding the procedure were encouraged and answered. The patient understands and consents to the procedure. A time-out procedure was performed.  The right flank  region was prepped with Betadine in a sterile fashion, and a sterile drape was applied covering the operative field. A sterile gown and sterile gloves were used for the procedure. Local anesthesia was provided with 1% Lidocaine.  Ultrasound was used to localize the right kidney. Under direct ultrasound guidance, a 21 gauge needle was advanced into the renal collecting system. Ultrasound image documentation was performed. Aspiration  of urine sample was performed followed by contrast injection.  A second puncture of the right renal collecting system was then performed under fluoroscopy with a second 21 gauge needle. Aspiration of urine was followed by injection of contrast. A guidewire was then advanced into the collecting system. A transitional dilator was placed. A guidewire was advanced. A 5 French catheter was then advanced over the wire. Catheter access was then obtained into the ureter and the catheter advanced into the bladder.  The catheter was secured at the skin with a silk retention suture and capped.  COMPLICATIONS: None.  FINDINGS: Initial fluoroscopy demonstrates dominant upper pole calculus and a smaller lower pole calculus. Initial upper pole access was performed under ultrasound guidance, allowing opacification of the collecting system. Lower pole access was then obtained and a catheter advanced through the ureter and into the bladder. This access will be utilized for tract dilatation during nephrolithotomy.  IMPRESSION: Percutaneous right-sided renal access and placement of ureteral catheter prior to operative nephrolithotomy.   Electronically Signed   By: Aletta Edouard M.D.   On: 09/17/2013 11:22    Assessment/Plan: POD#2 from right PCNL. Nephrostogram with inadequate drainage so will be discharged home with this. -- Repeat hgb later this morning. Will d/c home as planned if stable.     LOS: 2 days   Champ Keetch C 09/19/2013, 7:39 AM

## 2013-09-21 ENCOUNTER — Other Ambulatory Visit: Payer: Self-pay | Admitting: Urology

## 2013-09-21 DIAGNOSIS — N200 Calculus of kidney: Secondary | ICD-10-CM

## 2013-09-24 ENCOUNTER — Ambulatory Visit (HOSPITAL_COMMUNITY)
Admission: RE | Admit: 2013-09-24 | Discharge: 2013-09-24 | Disposition: A | Discharge status: Home / Self Care | Payer: Medicare Other | Source: Ambulatory Visit | Attending: Urology | Admitting: Urology

## 2013-09-24 DIAGNOSIS — N200 Calculus of kidney: Secondary | ICD-10-CM | POA: Insufficient documentation

## 2013-09-24 MED ORDER — IOHEXOL 300 MG/ML  SOLN
50.0000 mL | Freq: Once | INTRAMUSCULAR | Status: AC | PRN
  Administered 2013-09-24: 15 mL

## 2013-09-28 ENCOUNTER — Other Ambulatory Visit: Payer: Self-pay | Admitting: Cardiovascular Disease

## 2013-09-29 NOTE — Op Note (Signed)
Preoperative diagnosis:large right renal calculi, greater than 20 mm in diameter  Postoperative diagnosis:same   Procedure:right percutaneous nephrolithotomy    Surgeon: Lillette Boxer. Charl Wellen, M.D.   Anesthesia: Gen.   Complications:none  Specimen(s):stone fragments, to family  Drain(s):16 [place] pigtail catheter used as nephrostomy tube  Indications:68 year old male with symptomatic largeright renal calculi.  Treatment has been delayed as the patient has been on Plavix after stent placement for several months.  He has now been cleared to come off of his Plavix by Dr. Burt Knack, and presents for percutaneous nephrolithotomy.  The patient prior to this procedure, just beforehand, had a nephrostomy tube placed by interventional radiology.   Technique and findings:the patient was properly identified and marked in the holding area.  He had received preoperative IV antibiotics.  He was taken to the operating room.  General anesthetic was induced, and endotracheal tube was placed.  His bladder was catheterized with a 16 French Foley catheter.  Following this, he was placed in the prone position on the operating room table.  All extremities and pressure points were padded appropriately.  PAS hose were placed additionally.  The patient's right flank was prepped and draped.  The nephrostomy tube was prepped into the field.  Proper timeout was performed.  Using fluoroscopic guidance, a guidewire was placed through the Kumpe catheter down into the bladder.  The Kumpe catheter was removed, and a peel-away catheter was placed over top of the guidewire.  The inner core was removed, and through this sheath a placed a second guidewire, fluoroscopically passed into the bladder.  Peel-away catheter was removed.  Over a working guidewire, I used the NephroMax balloon to dilate the patient's percutaneous tract into the kidney.  The balloon was filled to 20 atm for 5 minutes.  It was very difficult due to the  supracostal extent of the nephrostomy tube, but I eventually passed a nephrostomy access sheath over the balloon into the right renal pelvis.  The balloon was deflated and removed.  The nephroscope was then placed.  Stone burden was removed through the nephroscope.  I used the stone breaker to fragment the larger fragments for removal.  Using the cystoscope, I removed the remaining stone burden using a nitinol basket.  The cystoscope was used to navigate all calyces, clearing the stone burden and all the calyceal system.  I negotiated the flexible cystoscope into the proximal ureter, with no stones were seen.  The flexible ureteroscope was used to traverse the ureter.  Again, no further stones were seen.  The scope and the access sheath were removed.  I placed a [place] pigtail catheter over the working guidewire.  Position was verified by injecting Omnipaque.  The calyceal system was normal with a minimal amount of extravasation.  There was adequate antegrade movement of the contrast, without evidence of ureteral extravasation or obstruction.  I then removed the remaining guidewire.  A curl was present on the [facility] catheter.  I then fastened the retaining suture on the [facility] catheter, and placed this to dependent drain.  Skin sutures were placed in the nephrostomy access.  Dry sterile dressing was placed.  The patient was then awakened and taken to PACU in stable condition.  He tolerated the procedure well.

## 2013-10-05 ENCOUNTER — Other Ambulatory Visit: Payer: Self-pay | Admitting: Cardiovascular Disease

## 2013-10-06 ENCOUNTER — Other Ambulatory Visit: Payer: Self-pay | Admitting: Urology

## 2013-10-06 ENCOUNTER — Other Ambulatory Visit: Payer: Self-pay | Admitting: Cardiovascular Disease

## 2013-10-16 ENCOUNTER — Encounter (HOSPITAL_COMMUNITY): Payer: Self-pay | Admitting: Pharmacy Technician

## 2013-10-29 ENCOUNTER — Telehealth: Payer: Self-pay | Admitting: Cardiovascular Disease

## 2013-10-29 NOTE — Telephone Encounter (Signed)
New message      Pt has lithotripsy scheduled on 11-05-13.  Need clearance.  Please fax clearance to [phone].

## 2013-10-29 NOTE — Telephone Encounter (Signed)
I will forward this message to Dr Burt Knack for review.  We had previously cleared the pt in 08/25/13 telephone encounter for a urology procedure in September.  Pt is now scheduled for lithotripsy on 11/05/13 and requires clearance again.

## 2013-10-29 NOTE — Progress Notes (Signed)
Requested cardiac clearance for patient from scheduler at [facility] for ESWL on 11/05/13.

## 2013-10-31 NOTE — Telephone Encounter (Signed)
As long as no new symptoms, he can proceed without further testing as he did well with his other surgery.  Sherren Mocha 10/31/2013 4:53 AM

## 2013-11-02 ENCOUNTER — Encounter: Payer: Self-pay | Admitting: Cardiovascular Disease

## 2013-11-02 ENCOUNTER — Encounter (HOSPITAL_COMMUNITY): Payer: Self-pay | Admitting: *Deleted

## 2013-11-02 NOTE — Telephone Encounter (Signed)
I called the pt's home and he was unavailable for me to speak with at this time. I spoke with the pt's wife and to her knowledge the pt has not complained of any chest pain or SOB. She said the pt is following instructions given to him by [facility], the pt is holding plavix 5 days prior and ASA 3 days prior to lithotripsy.  I advised Mrs Eastham to make the pt aware that I had called and if he has had any cardiac symptoms then he should contact our office.  I will fax this phone note to [facility].

## 2013-11-02 NOTE — Telephone Encounter (Signed)
This encounter was created in error - please disregard.

## 2013-11-02 NOTE — Telephone Encounter (Signed)
New message    Surgery on 11/5. Status of cardiac clearance.

## 2013-11-05 ENCOUNTER — Ambulatory Visit (HOSPITAL_COMMUNITY): Payer: Medicare Other

## 2013-11-05 ENCOUNTER — Encounter (HOSPITAL_COMMUNITY): Payer: Self-pay

## 2013-11-05 ENCOUNTER — Ambulatory Visit (HOSPITAL_COMMUNITY)
Admission: RE | Admit: 2013-11-05 | Discharge: 2013-11-05 | Disposition: A | Discharge status: Home / Self Care | Payer: Medicare Other | Source: Ambulatory Visit | Attending: Urology | Admitting: Urology

## 2013-11-05 ENCOUNTER — Encounter (HOSPITAL_COMMUNITY)
Admission: RE | Disposition: A | Discharge status: Home / Self Care | Payer: Self-pay | Source: Ambulatory Visit | Attending: Urology

## 2013-11-05 ENCOUNTER — Other Ambulatory Visit: Payer: Self-pay | Admitting: Cardiology

## 2013-11-05 ENCOUNTER — Other Ambulatory Visit: Payer: Self-pay | Admitting: *Deleted

## 2013-11-05 ENCOUNTER — Other Ambulatory Visit: Payer: Self-pay | Admitting: Cardiovascular Disease

## 2013-11-05 DIAGNOSIS — I129 Hypertensive chronic kidney disease with stage 1 through stage 4 chronic kidney disease, or unspecified chronic kidney disease: Secondary | ICD-10-CM | POA: Insufficient documentation

## 2013-11-05 DIAGNOSIS — E785 Hyperlipidemia, unspecified: Secondary | ICD-10-CM | POA: Insufficient documentation

## 2013-11-05 DIAGNOSIS — I252 Old myocardial infarction: Secondary | ICD-10-CM | POA: Insufficient documentation

## 2013-11-05 DIAGNOSIS — N183 Chronic kidney disease, stage 3 (moderate): Secondary | ICD-10-CM | POA: Insufficient documentation

## 2013-11-05 DIAGNOSIS — K219 Gastro-esophageal reflux disease without esophagitis: Secondary | ICD-10-CM | POA: Diagnosis not present

## 2013-11-05 DIAGNOSIS — I2510 Atherosclerotic heart disease of native coronary artery without angina pectoris: Secondary | ICD-10-CM | POA: Insufficient documentation

## 2013-11-05 DIAGNOSIS — Z87891 Personal history of nicotine dependence: Secondary | ICD-10-CM | POA: Diagnosis not present

## 2013-11-05 DIAGNOSIS — Z951 Presence of aortocoronary bypass graft: Secondary | ICD-10-CM | POA: Insufficient documentation

## 2013-11-05 DIAGNOSIS — N200 Calculus of kidney: Secondary | ICD-10-CM | POA: Diagnosis present

## 2013-11-05 DIAGNOSIS — Z86010 Personal history of colonic polyps: Secondary | ICD-10-CM | POA: Insufficient documentation

## 2013-11-05 SURGERY — LITHOTRIPSY, ESWL
Anesthesia: LOCAL | Laterality: Left

## 2013-11-05 MED ORDER — DEXTROSE-NACL 5-0.45 % IV SOLN
INTRAVENOUS | Status: DC
  Administered 2013-11-05: 10:00:00 via INTRAVENOUS

## 2013-11-05 MED ORDER — AMLODIPINE BESYLATE 2.5 MG PO TABS: 2.5000 mg | ORAL_TABLET | Freq: Every morning | ORAL | Status: DC

## 2013-11-05 MED ORDER — METOPROLOL TARTRATE 50 MG PO TABS: 50.0000 mg | ORAL_TABLET | Freq: Two times a day (BID) | ORAL | Status: DC

## 2013-11-05 MED ORDER — LEVOFLOXACIN 500 MG PO TABS
500.0000 mg | ORAL_TABLET | ORAL | Status: AC
  Administered 2013-11-05: 500 mg via ORAL
  Filled 2013-11-05: qty 1

## 2013-11-05 MED ORDER — DIPHENHYDRAMINE HCL 25 MG PO CAPS
25.0000 mg | ORAL_CAPSULE | ORAL | Status: AC
  Administered 2013-11-05: 25 mg via ORAL
  Filled 2013-11-05: qty 1

## 2013-11-05 MED ORDER — DIAZEPAM 5 MG PO TABS
10.0000 mg | ORAL_TABLET | ORAL | Status: AC
  Administered 2013-11-05: 10 mg via ORAL
  Filled 2013-11-05: qty 2

## 2013-11-05 MED ORDER — OXYCODONE-ACETAMINOPHEN 5-325 MG PO TABS: 1.0000 | ORAL_TABLET | ORAL | Status: DC | PRN

## 2013-11-05 MED ORDER — CLOPIDOGREL BISULFATE 75 MG PO TABS: 75.0000 mg | ORAL_TABLET | Freq: Every day | ORAL | Status: DC

## 2013-11-05 NOTE — Op Note (Signed)
See [facility] note scanned into chart. 

## 2013-11-05 NOTE — Discharge Instructions (Signed)
See [facility] discharge instructions in chart. ° ° ° ° °Conscious Sedation, Adult, Care After °Refer to this sheet in the next few weeks. These instructions provide you with information on caring for yourself after your procedure. Your health care provider may also give you more specific instructions. Your treatment has been planned according to current medical practices, but problems sometimes occur. Call your health care provider if you have any problems or questions after your procedure. °WHAT TO EXPECT AFTER THE PROCEDURE  °After your procedure: °· You may feel sleepy, clumsy, and have poor balance for several hours. °· Vomiting may occur if you eat too soon after the procedure. °HOME CARE INSTRUCTIONS °· Do not participate in any activities where you could become injured for at least 24 hours. Do not: °¨ Drive. °¨ Swim. °¨ Ride a bicycle. °¨ Operate heavy machinery. °¨ Cook. °¨ Use power tools. °¨ Climb ladders. °¨ Work from a high place. °· Do not make important decisions or sign legal documents until you are improved. °· If you vomit, drink water, juice, or soup when you can drink without vomiting. Make sure you have little or no nausea before eating solid foods. °· Only take over-the-counter or prescription medicines for pain, discomfort, or fever as directed by your health care provider. °· Make sure you and your family fully understand everything about the medicines given to you, including what side effects may occur. °· You should not drink alcohol, take sleeping pills, or take medicines that cause drowsiness for at least 24 hours. °· If you smoke, do not smoke without supervision. °· If you are feeling better, you may resume normal activities 24 hours after you were sedated. °· Keep all appointments with your health care provider. °SEEK MEDICAL CARE IF: °· Your skin is pale or bluish in color. °· You continue to feel nauseous or vomit. °· Your pain is getting worse and is not helped by  medicine. °· You have bleeding or swelling. °· You are still sleepy or feeling clumsy after 24 hours. °SEEK IMMEDIATE MEDICAL CARE IF: °· You develop a rash. °· You have difficulty breathing. °· You develop any type of allergic problem. °· You have a fever. °MAKE SURE YOU: °· Understand these instructions. °· Will watch your condition. °· Will get help right away if you are not doing well or get worse. °Document Released: 10/08/2012 Document Reviewed: 10/08/2012 °ExitCare® Patient Information ©2015 [employer]. This information is not intended to replace advice given to you by your health care provider. Make sure you discuss any questions you have with your health care provider. ° °

## 2013-11-05 NOTE — H&P (Signed)
Urology History and Physical Exam  TK:PTWS renal stone  68 year old male presents for esl of an 8 by 10 mm left renal stone. He has undergone recent right PCNL.  PMH: Past Medical History  Diagnosis Date  . CAD (coronary artery disease)     a. CABG 1993;  b. PCI of SVG to OM Jan 2012 (Drug-eluting stent x 2);  c. 09/2012 Cath/PCI: LM nl, LAD sev dzs, LCX 100, RCA patent stent, VG->AM 100, LIMA->LAD nl, VG->OM 95p ISR (PTCA only), patent distal stent.  . History of nephrolithiasis   . Stricture and stenosis of esophagus   . Degeneration of lumbar or lumbosacral intervertebral disc   . Hyperlipidemia   . Hypertension   . Anemia   . Blood transfusion   . GERD (gastroesophageal reflux disease)   . Hyperplastic colon polyp   . CKD (chronic kidney disease), stage III   . Complication of anesthesia   . PONV (postoperative nausea and vomiting)   . Myocardial infarction     hx of five   . H/O hiatal hernia   . Headache(784.0)     PSH: Past Surgical History  Procedure Laterality Date  . Coronary artery bypass graft  1993  . Lithotripsy    . Angioplasty    . Colonoscopy    . Lumbar back    . Back surgery    . Cardiac catheterization    . Nephrolithotomy Right 09/17/2013    Procedure: NEPHROLITHOTOMY PERCUTANEOUS;  Surgeon: Jorja Loa, MD;  Location: [facility];  Service: Urology;  Laterality: Right;  . Holmium laser application Right 5/68/1275    Procedure: HOLMIUM LASER APPLICATION;  Surgeon: Jorja Loa, MD;  Location: [facility];  Service: Urology;  Laterality: Right;    Allergies: No Known Allergies  Medications: Facility-administered medications prior to admission  Medication Dose Route Frequency Provider Last Rate Last Dose  . triamcinolone acetonide (KENALOG) 10 MG/ML injection 10 mg  10 mg Other Once Harriet Masson, DPM      . triamcinolone acetonide (KENALOG) 10 MG/ML injection 10 mg  10 mg Other Once Harriet Masson, DPM       Prescriptions prior to  admission  Medication Sig Dispense Refill Last Dose  . amLODipine (NORVASC) 2.5 MG tablet Take 2.5 mg by mouth every morning.   11/04/2013 at am  . atorvastatin (LIPITOR) 40 MG tablet Take 40 mg by mouth every morning.   11/04/2013 at Unknown time  . metoprolol (LOPRESSOR) 50 MG tablet Take 50 mg by mouth 2 (two) times daily.   11/04/2013 at Unknown time  . pantoprazole (PROTONIX) 40 MG tablet Take 40 mg by mouth daily.   11/04/2013 at Unknown time  . aspirin 325 MG tablet Take 325 mg by mouth daily.   11/01/2013 at 0800  . clopidogrel (PLAVIX) 75 MG tablet Take 75 mg by mouth at bedtime.   10/31/2013 at 2200  . ferrous sulfate 325 (65 FE) MG tablet Take 325 mg by mouth every morning.    11/01/2013  . nitroGLYCERIN (NITROSTAT) 0.4 MG SL tablet Place 0.4 mg under the tongue every 5 (five) minutes as needed for chest pain.   More than a month at Unknown time     Social History: History   Social History  . Marital Status: Married    Spouse Name: N/A    Number of Children: 2  . Years of Education: N/A   Occupational History  . Retired     Chemical engineer  Social History Main Topics  . Smoking status: Former Smoker    Quit date: 01/06/1987  . Smokeless tobacco: Former Systems developer  . Alcohol Use: No  . Drug Use: No  . Sexual Activity: Not on file   Other Topics Concern  . Not on file   Social History Narrative    Family History: Family History  Problem Relation Age of Onset  . Coronary artery disease Mother 10    deceased.status post CABG  . Lung cancer Brother   . Lung cancer Sister   . CAD Other 39    5 brothers  . CAD Other 31    4 sisters  . Kidney cancer Mother   . Colon cancer Neg Hx     Review of Systems: Positive: N/A Negative: .  A further 10 point review of systems was negative except what is listed in the HPI.                  Physical Exam: @VITALS2 @ General: No acute distress.  Awake. Head:  Normocephalic.  Atraumatic. ENT:  EOMI.  Mucous membranes  moist Neck:  Supple.  No lymphadenopathy. CV:  S1 present. S2 present. Regular rate. Pulmonary: Equal effort bilaterally.  Clear to auscultation bilaterally. Abdomen: Soft.  Nontender to palpation. Skin:  Normal turgor.  No visible rash. Extremity: No gross deformity of bilateral upper extremities.  No gross deformity of                             lower extremities. Neurologic: Alert. Appropriate mood.    Studies:  No results for input(s): HGB, WBC, PLT in the last 72 hours.  No results for input(s): NA, K, CL, CO2, BUN, CREATININE, CALCIUM, GFRNONAA, GFRAA in the last 72 hours.  Invalid input(s): MAGNESIUM   No results for input(s): INR, APTT in the last 72 hours.  Invalid input(s): PT   Invalid input(s): ABG    Assessment:  Left renal stone  Plan: Left ESL

## 2013-11-30 ENCOUNTER — Ambulatory Visit (INDEPENDENT_AMBULATORY_CARE_PROVIDER_SITE_OTHER): Payer: Medicare Other | Admitting: Physician Assistant

## 2013-11-30 ENCOUNTER — Encounter: Payer: Self-pay | Admitting: Physician Assistant

## 2013-11-30 VITALS — BP 132/72 | HR 83 | Ht 65.0 in | Wt 186.0 lb

## 2013-11-30 DIAGNOSIS — I25810 Atherosclerosis of coronary artery bypass graft(s) without angina pectoris: Secondary | ICD-10-CM

## 2013-11-30 DIAGNOSIS — I10 Essential (primary) hypertension: Secondary | ICD-10-CM

## 2013-11-30 DIAGNOSIS — R079 Chest pain, unspecified: Secondary | ICD-10-CM

## 2013-11-30 DIAGNOSIS — I25718 Atherosclerosis of autologous vein coronary artery bypass graft(s) with other forms of angina pectoris: Secondary | ICD-10-CM

## 2013-11-30 MED ORDER — AMLODIPINE BESYLATE 2.5 MG PO TABS: 2.5000 mg | ORAL_TABLET | Freq: Every morning | ORAL | Status: DC

## 2013-11-30 MED ORDER — METOPROLOL TARTRATE 50 MG PO TABS: 50.0000 mg | ORAL_TABLET | Freq: Two times a day (BID) | ORAL | Status: DC

## 2013-11-30 MED ORDER — ATORVASTATIN CALCIUM 40 MG PO TABS: 40.0000 mg | ORAL_TABLET | Freq: Every morning | ORAL | Status: DC

## 2013-11-30 MED ORDER — PANTOPRAZOLE SODIUM 40 MG PO TBEC: 40.0000 mg | DELAYED_RELEASE_TABLET | Freq: Every day | ORAL | Status: DC

## 2013-11-30 NOTE — Assessment & Plan Note (Signed)
BP stable.

## 2013-11-30 NOTE — Progress Notes (Signed)
HPI: This is a 68 y.o. Male patient of Dr. Burt Knack who has history of CAD S/P CABG 1993. He's undergone PCI of the vein graft obtuse marginal initially in 2009 and again in 2012 and then most recently in September 2014. He's also undergone multiple PCI procedures in the native RCA. His LIMA graft has remained widely patent. He recently had lithotripsy 11/06/13 and had to hold his Plavix for 5 days.  Patient comes in today for follow-up. He's had one episode of chest pain since his last office visit. He was walking up a steep incline at his home and had some chest tightness and shortness of breath that lasted about 10 minutes and relieved with one nitroglycerin. That occurred back in October. Since then he has been working in his yard, climbing hills, and raking leaves without symptoms. He denies any further chest pain, palpitations, dyspnea, dyspnea on exertion, dizziness or presyncope. He and his wife are taking care of their 25 yr old son they adopted. He also took out a $15,000 loan to pay his medical expenses.  No Known Allergies   Current Outpatient Prescriptions  Medication Sig Dispense Refill  . amLODipine (NORVASC) 2.5 MG tablet Take 1 tablet (2.5 mg total) by mouth every morning. 30 tablet 0  . aspirin 325 MG tablet Take 325 mg by mouth daily.    Marland Kitchen atorvastatin (LIPITOR) 40 MG tablet Take 40 mg by mouth every morning.    . clopidogrel (PLAVIX) 75 MG tablet Take 1 tablet (75 mg total) by mouth at bedtime. 30 tablet 0  . ferrous sulfate 325 (65 FE) MG tablet Take 325 mg by mouth every morning.     . metoprolol (LOPRESSOR) 50 MG tablet Take 1 tablet (50 mg total) by mouth 2 (two) times daily. 60 tablet 0  . nitroGLYCERIN (NITROSTAT) 0.4 MG SL tablet Place 0.4 mg under the tongue every 5 (five) minutes as needed for chest pain.    Marland Kitchen oxyCODONE-acetaminophen (ROXICET) 5-325 MG per tablet Take 1 tablet by mouth every 4 (four) hours as needed for severe pain. 30 tablet 0  .  pantoprazole (PROTONIX) 40 MG tablet Take 40 mg by mouth daily.     Current Facility-Administered Medications  Medication Dose Route Frequency Provider Last Rate Last Dose  . triamcinolone acetonide (KENALOG) 10 MG/ML injection 10 mg  10 mg Other Once Harriet Masson, DPM      . triamcinolone acetonide (KENALOG) 10 MG/ML injection 10 mg  10 mg Other Once Harriet Masson, DPM        Past Medical History  Diagnosis Date  . CAD (coronary artery disease)     a. CABG 1993;  b. PCI of SVG to OM Jan 2012 (Drug-eluting stent x 2);  c. 09/2012 Cath/PCI: LM nl, LAD sev dzs, LCX 100, RCA patent stent, VG->AM 100, LIMA->LAD nl, VG->OM 95p ISR (PTCA only), patent distal stent.  . History of nephrolithiasis   . Stricture and stenosis of esophagus   . Degeneration of lumbar or lumbosacral intervertebral disc   . Hyperlipidemia   . Hypertension   . Anemia   . Blood transfusion   . GERD (gastroesophageal reflux disease)   . Hyperplastic colon polyp   . CKD (chronic kidney disease), stage III   . Complication of anesthesia   . PONV (postoperative nausea and vomiting)   . Myocardial infarction     hx of five   . H/O hiatal hernia   . Headache(784.0)  Past Surgical History  Procedure Laterality Date  . Coronary artery bypass graft  1993  . Lithotripsy    . Angioplasty    . Colonoscopy    . Lumbar back    . Back surgery    . Cardiac catheterization    . Nephrolithotomy Right 09/17/2013    Procedure: NEPHROLITHOTOMY PERCUTANEOUS;  Surgeon: Jorja Loa, MD;  Location: [facility];  Service: Urology;  Laterality: Right;  . Holmium laser application Right 6/96/7893    Procedure: HOLMIUM LASER APPLICATION;  Surgeon: Jorja Loa, MD;  Location: [facility];  Service: Urology;  Laterality: Right;    Family History  Problem Relation Age of Onset  . Coronary artery disease Mother 48    deceased.status post CABG  . Lung cancer Brother   . Lung cancer Sister   . CAD Other 35    5 brothers    . CAD Other 79    4 sisters  . Kidney cancer Mother   . Colon cancer Neg Hx     History   Social History  . Marital Status: Married    Spouse Name: N/A    Number of Children: 2  . Years of Education: N/A   Occupational History  . Retired     Chemical engineer   Social History Main Topics  . Smoking status: Former Smoker    Quit date: 01/06/1987  . Smokeless tobacco: Former Systems developer  . Alcohol Use: No  . Drug Use: No  . Sexual Activity: Not on file   Other Topics Concern  . Not on file   Social History Narrative    ROS:see HPI otherwise negative  BP 132/72 mmHg  Pulse 83  Ht 5\' 5"  (1.651 m)  Wt 186 lb (84.369 kg)  BMI 30.95 kg/m2  PHYSICAL EXAM: Well-nournished, in no acute distress. Neck: No JVD, HJR, Bruit, or thyroid enlargement  Lungs: No tachypnea, clear without wheezing, rales, or rhonchi  Cardiovascular: RRR, PMI not displaced, Normal S1 and S2, positive S4, no murmurs,  bruit, thrill, or heave.  Abdomen: BS normal. Soft without organomegaly, masses, lesions or tenderness.  Extremities: without cyanosis, clubbing or edema. Good distal pulses bilateral  SKin: Warm, no lesions or rashes   Musculoskeletal: No deformities  Neuro: no focal signs   Wt Readings from Last 3 Encounters:  11/05/13 183 lb 3.2 oz (83.099 kg)  09/17/13 184 lb (83.462 kg)  09/16/13 184 lb (83.462 kg)      YBO:FBPZW bradycardia with inferior infact, nonspecific ST T Changes, no acute change  2Decho 01/09/10: Study Conclusions  Left ventricle: The cavity size was normal. Wall thickness was  increased in a pattern of mild LVH. Systolic function was normal.  The estimated ejection fraction was in the range of 60% to 65%. Wall  motion was normal; there were no regional wall motion abnormalities.  Doppler parameters are consistent with abnormal left ventricular  relaxation (grade 1 diastolic dysfunction).

## 2013-11-30 NOTE — Assessment & Plan Note (Signed)
One episode of chest pain relieved with NTG since last office visit. EKG unchanged and patient very active without further symptoms. Call if recurrent chest pain. F/U with Dr. Burt Knack in April.

## 2013-11-30 NOTE — Assessment & Plan Note (Signed)
Last lipid panel 09/2012. Check Fasting lipid panel & LFT's.

## 2013-11-30 NOTE — Patient Instructions (Addendum)
Your physician recommends that you continue on your current medications as directed. Please refer to the Current Medication list given to you today.  REFILLS HAVE BEEN SENT INTO YOUR PHARMACY  AMLODIPINE METOPROLOL LIPITOR PANTOPRAZOLE  Your physician recommends that you return for lab work in:  Wednesday  FOR FASTING LIPIDS AND LFT    Your physician recommends that you schedule a follow-up appointment in:  WITH DR COOPER IN [place]

## 2013-12-02 ENCOUNTER — Other Ambulatory Visit (INDEPENDENT_AMBULATORY_CARE_PROVIDER_SITE_OTHER): Payer: Medicare Other | Admitting: *Deleted

## 2013-12-02 DIAGNOSIS — I25718 Atherosclerosis of autologous vein coronary artery bypass graft(s) with other forms of angina pectoris: Secondary | ICD-10-CM

## 2013-12-02 DIAGNOSIS — E785 Hyperlipidemia, unspecified: Secondary | ICD-10-CM

## 2013-12-02 LAB — LIPID PANEL
CHOL/HDL RATIO: 10
Cholesterol: 209 mg/dL — ABNORMAL HIGH (ref 0–200)
HDL: 22 mg/dL — AB (ref >39.00)
NONHDL: 187
TRIGLYCERIDES: 211 mg/dL — AB (ref 0.0–149.0)
VLDL: 42.2 mg/dL — ABNORMAL HIGH (ref 0.0–40.0)

## 2013-12-02 LAB — HEPATIC FUNCTION PANEL
ALT: 24 U/L (ref 0–53)
AST: 21 U/L (ref 0–37)
Albumin: 3.6 g/dL (ref 3.5–5.2)
Alkaline Phosphatase: 88 U/L (ref 39–117)
BILIRUBIN DIRECT: 0 mg/dL (ref 0.0–0.3)
BILIRUBIN TOTAL: 0.5 mg/dL (ref 0.2–1.2)
Total Protein: 6.6 g/dL (ref 6.0–8.3)

## 2013-12-03 LAB — LDL CHOLESTEROL, DIRECT: Direct LDL: 122.5 mg/dL

## 2013-12-07 ENCOUNTER — Other Ambulatory Visit: Payer: Medicare Other

## 2013-12-09 ENCOUNTER — Telehealth: Payer: Self-pay | Admitting: Cardiovascular Disease

## 2013-12-09 DIAGNOSIS — E785 Hyperlipidemia, unspecified: Secondary | ICD-10-CM

## 2013-12-09 NOTE — Telephone Encounter (Signed)
New Message        Pt's wife calling to get lab results. Please call back and advise.

## 2013-12-09 NOTE — Telephone Encounter (Signed)
I spoke with the pt and made him aware that he needs to continue lipitor and recheck his lipid panel on 01/20/14. Pt agreed with plan.

## 2013-12-10 ENCOUNTER — Encounter (HOSPITAL_COMMUNITY): Payer: Self-pay | Admitting: Cardiology

## 2014-01-20 ENCOUNTER — Other Ambulatory Visit: Payer: Self-pay | Admitting: Cardiovascular Disease

## 2014-01-20 ENCOUNTER — Other Ambulatory Visit: Payer: Medicare Other

## 2014-03-17 ENCOUNTER — Ambulatory Visit: Payer: Medicare Other | Admitting: Cardiovascular Disease

## 2014-04-08 ENCOUNTER — Encounter: Payer: Self-pay | Admitting: Cardiovascular Disease

## 2014-04-08 ENCOUNTER — Ambulatory Visit (INDEPENDENT_AMBULATORY_CARE_PROVIDER_SITE_OTHER): Payer: Medicare Other | Admitting: Cardiovascular Disease

## 2014-04-08 VITALS — BP 138/64 | HR 59 | Ht 66.0 in | Wt 187.0 lb

## 2014-04-08 DIAGNOSIS — I10 Essential (primary) hypertension: Secondary | ICD-10-CM | POA: Diagnosis not present

## 2014-04-08 DIAGNOSIS — E785 Hyperlipidemia, unspecified: Secondary | ICD-10-CM | POA: Diagnosis not present

## 2014-04-08 DIAGNOSIS — I25110 Atherosclerotic heart disease of native coronary artery with unstable angina pectoris: Secondary | ICD-10-CM | POA: Diagnosis not present

## 2014-04-08 MED ORDER — ISOSORBIDE MONONITRATE ER 30 MG PO TB24: 30.0000 mg | ORAL_TABLET | Freq: Every day | ORAL | Status: DC

## 2014-04-08 NOTE — Patient Instructions (Addendum)
Your physician has recommended you make the following change in your medication:  1. START Isosorbide MN 30mg  take one-half tablet by mouth daily for 1 WEEK and then increase to one tablet by mouth daily   Your physician wants you to follow-up in: 6 MONTHS with Dr Burt Knack.  You will receive a reminder letter in the mail two months in advance. If you don't receive a letter, please call our office to schedule the follow-up appointment.

## 2014-04-08 NOTE — Progress Notes (Signed)
Cardiology Office Note   Date:  04/09/2014   ID:  [name], DOB 09-06-1945, MRN [id number]  PCP:  Allean Found, MD  Cardiologist:  Sherren Mocha, MD    No chief complaint on file.    History of Present Illness: [name] is a 69 y.o. male who presents for follow-up of CAD. He has history of CAD S/P CABG 1993. He's undergone PCI of the vein graft obtuse marginal initially in 2009 and again in 2012 and then most recently in September 2014. He's also undergone multiple PCI procedures in the native RCA. His LIMA graft has remained widely patent. He recently had lithotripsy 11/06/13 and had to hold his Plavix for 5 days.  He has lots of problems with his feet. He's been getting injections that help quite a bit. Initially this was his only complaint. However, after further discussion he admits to 2 recent episodes of anginal chest discomfort, both occurred in the cold weather when he rushed out of his house. He's had no symptoms over the past few weeks. He denies resting chest discomfort, dyspnea, palpitations, orthopnea, or PND. His chest pain episodes felt like squeezing pressure over the left chest. This was typical of his angina. Symptoms were relieved with sublingual nitroglycerin and rest. He has had no symptoms with his routine activities.   Past Medical History  Diagnosis Date  . CAD (coronary artery disease)     a. CABG 1993;  b. PCI of SVG to OM Jan 2012 (Drug-eluting stent x 2);  c. 09/2012 Cath/PCI: LM nl, LAD sev dzs, LCX 100, RCA patent stent, VG->AM 100, LIMA->LAD nl, VG->OM 95p ISR (PTCA only), patent distal stent.  . History of nephrolithiasis   . Stricture and stenosis of esophagus   . Degeneration of lumbar or lumbosacral intervertebral disc   . Hyperlipidemia   . Hypertension   . Anemia   . Blood transfusion   . GERD (gastroesophageal reflux disease)   . Hyperplastic colon polyp   . CKD (chronic kidney disease), stage III   . Complication of anesthesia   . PONV  (postoperative nausea and vomiting)   . Myocardial infarction     hx of five   . H/O hiatal hernia   . HERDEYCX(448.1)     Past Surgical History  Procedure Laterality Date  . Coronary artery bypass graft  1993  . Lithotripsy    . Angioplasty    . Colonoscopy    . Lumbar back    . Back surgery    . Cardiac catheterization    . Nephrolithotomy Right 09/17/2013    Procedure: NEPHROLITHOTOMY PERCUTANEOUS;  Surgeon: Jorja Loa, MD;  Location: [facility];  Service: Urology;  Laterality: Right;  . Holmium laser application Right 8/56/3149    Procedure: HOLMIUM LASER APPLICATION;  Surgeon: Jorja Loa, MD;  Location: [facility];  Service: Urology;  Laterality: Right;  . Left heart catheterization with coronary/graft angiogram N/A 09/23/2012    Procedure: LEFT HEART CATHETERIZATION WITH Beatrix Fetters;  Surgeon: Peter M Martinique, MD;  Location: [facility] CATH LAB;  Service: Cardiovascular;  Laterality: N/A;  . Percutaneous coronary intervention-balloon only  09/23/2012    Procedure: PERCUTANEOUS CORONARY INTERVENTION-BALLOON ONLY;  Surgeon: Peter M Martinique, MD;  Location: [facility] CATH LAB;  Service: Cardiovascular;;  OM vein graft    Current Outpatient Prescriptions  Medication Sig Dispense Refill  . amLODipine (NORVASC) 2.5 MG tablet Take 1 tablet (2.5 mg total) by mouth every morning. 30 tablet 6  . aspirin 325 MG  tablet Take 325 mg by mouth daily.    Marland Kitchen atorvastatin (LIPITOR) 40 MG tablet Take 1 tablet (40 mg total) by mouth every morning. 90 tablet 3  . clopidogrel (PLAVIX) 75 MG tablet ONE TABLET AT BEDTIME 30 tablet 6  . ferrous sulfate 325 (65 FE) MG tablet Take 325 mg by mouth daily with breakfast.    . metoprolol (LOPRESSOR) 50 MG tablet Take 1 tablet (50 mg total) by mouth 2 (two) times daily. 180 tablet 3  . nitroGLYCERIN (NITROSTAT) 0.4 MG SL tablet Place 0.4 mg under the tongue every 5 (five) minutes as needed for chest pain.    Marland Kitchen oxyCODONE-acetaminophen (ROXICET) 5-325 MG per  tablet Take 1 tablet by mouth every 4 (four) hours as needed for severe pain. 30 tablet 0  . pantoprazole (PROTONIX) 40 MG tablet Take 1 tablet (40 mg total) by mouth daily. 30 tablet 6  . isosorbide mononitrate (IMDUR) 30 MG 24 hr tablet Take 1 tablet (30 mg total) by mouth daily. 90 tablet 3   Current Facility-Administered Medications  Medication Dose Route Frequency Provider Last Rate Last Dose  . triamcinolone acetonide (KENALOG) 10 MG/ML injection 10 mg  10 mg Other Once Harriet Masson, DPM      . triamcinolone acetonide (KENALOG) 10 MG/ML injection 10 mg  10 mg Other Once Harriet Masson, DPM        Allergies:   Review of patient's allergies indicates no known allergies.   Social History:  The patient  reports that he quit smoking about 27 years ago. He has quit using smokeless tobacco. He reports that he does not drink alcohol or use illicit drugs.   Family History:  The patient's family history includes CAD (age of onset: 55) in his other and other; Coronary artery disease (age of onset: 35) in his mother; Kidney cancer in his mother; Lung cancer in his brother and sister. There is no history of Colon cancer.    ROS:  Please see the history of present illness.  Otherwise, review of systems is positive for chest pain, leg swelling, shortness of breath, hearing loss, back pain, muscle pain, rash, leg pain.  All other systems are reviewed and negative.    PHYSICAL EXAM: VS:  BP 138/64 mmHg  Pulse 59  Ht 5\' 6"  (1.676 m)  Wt 187 lb (84.823 kg)  BMI 30.20 kg/m2  SpO2 97% , BMI Body mass index is 30.2 kg/(m^2). GEN: Well nourished, well developed, in no acute distress HEENT: normal Neck: no JVD, no masses. No carotid bruits Cardiac: RRR without murmur or gallop                Respiratory:  clear to auscultation bilaterally, normal work of breathing GI: soft, nontender, nondistended, + BS MS: no deformity or atrophy Ext: no pretibial edema, pedal pulses 2+= bilaterally Skin: warm  and dry, no rash Neuro:  Strength and sensation are intact Psych: euthymic mood, full affect  EKG:  EKG is ordered today. The ekg ordered today shows normal sinus rhythm 62 bpm, nonspecific T wave abnormality.  Recent Labs: 09/16/2013: BUN 15; Creatinine 1.37*; Platelets 165; Potassium 4.5; Sodium 140 09/19/2013: Hemoglobin 12.2* 12/02/2013: ALT 24   Lipid Panel     Component Value Date/Time   CHOL 209* 12/02/2013 0922   TRIG 211.0* 12/02/2013 0922   HDL 22.00* 12/02/2013 0922   CHOLHDL 10 12/02/2013 0922   VLDL 42.2* 12/02/2013 0922   LDLCALC 80 09/23/2012 0505   LDLDIRECT 122.5 12/02/2013 4193  Wt Readings from Last 3 Encounters:  04/08/14 187 lb (84.823 kg)  11/30/13 186 lb (84.369 kg)  11/05/13 183 lb 3.2 oz (83.099 kg)     ASSESSMENT AND PLAN: 1.  CAD, native vessel, with CCS functional class II angina. The patient has had 2 recent episodes of angina associated with cold weather. I reviewed his medication program and recommended that we add isosorbide 30 mg daily. He continues on long-term dual antiplatelet therapy after extensive stenting and known degenerated vein graft disease. He understands to seek immediate medical attention for prolonged chest pain or progressive symptoms of angina.  2. Essential hypertension: On blood pressure readings have been controlled. He will continue on amlodipine and metoprolol.  3. Hyperlipidemia: The patient is treated with atorvastatin. He was counseled regarding lifestyle modification.  Current medicines are reviewed with the patient today.  The patient does not have concerns regarding medicines.  Labs/ tests ordered today include:   Orders Placed This Encounter  Procedures  . EKG 12-Lead    Disposition:   FU 6 months  Signed, Sherren Mocha, MD  04/09/2014 4:15 AM    [facility] [place], [place], [place]  [address] Phone: [phone]; Fax: [phone]

## 2014-04-14 ENCOUNTER — Other Ambulatory Visit: Payer: Self-pay

## 2014-04-14 MED ORDER — PANTOPRAZOLE SODIUM 40 MG PO TBEC: 40.0000 mg | DELAYED_RELEASE_TABLET | Freq: Every day | ORAL | Status: DC

## 2014-04-14 MED ORDER — AMLODIPINE BESYLATE 2.5 MG PO TABS: 2.5000 mg | ORAL_TABLET | Freq: Every morning | ORAL | Status: DC

## 2014-04-14 MED ORDER — CLOPIDOGREL BISULFATE 75 MG PO TABS: 75.0000 mg | ORAL_TABLET | Freq: Every day | ORAL | Status: DC

## 2014-11-03 ENCOUNTER — Encounter: Payer: Self-pay | Admitting: Internal Medicine

## 2014-11-10 ENCOUNTER — Other Ambulatory Visit: Payer: Self-pay

## 2014-11-10 MED ORDER — METOPROLOL TARTRATE 50 MG PO TABS: 50.0000 mg | ORAL_TABLET | Freq: Two times a day (BID) | ORAL | Status: DC

## 2014-11-10 NOTE — Telephone Encounter (Signed)
Sherren Mocha, MD at 04/08/2014 3:56 PM  metoprolol (LOPRESSOR) 50 MG tabletTake 1 tablet (50 mg total) by mouth 2 (two) times daily 2. Essential hypertension: On blood pressure readings have been controlled. He will continue on amlodipine and metoprolol

## 2014-11-11 ENCOUNTER — Other Ambulatory Visit: Payer: Self-pay

## 2014-11-11 NOTE — Telephone Encounter (Signed)
Notes Recorded by Lurline Del, RN on 12/09/2013 at 4:50 PM I spoke with the pt and made him aware that he needs to continue lipitor and recheck his lipid panel on 01/20/14. Pt agreed with plan.

## 2014-11-12 MED ORDER — ATORVASTATIN CALCIUM 40 MG PO TABS: 40.0000 mg | ORAL_TABLET | Freq: Every morning | ORAL | Status: DC

## 2014-11-12 NOTE — Telephone Encounter (Signed)
One refill authorized at this time.  The pt has a pending appointment with Dr Burt Knack and we will address lipid management at that time.

## 2014-12-13 ENCOUNTER — Encounter: Payer: Self-pay | Admitting: Cardiovascular Disease

## 2014-12-13 ENCOUNTER — Ambulatory Visit (INDEPENDENT_AMBULATORY_CARE_PROVIDER_SITE_OTHER): Payer: Medicare Other | Admitting: Cardiovascular Disease

## 2014-12-13 VITALS — BP 142/80 | HR 68 | Ht 66.0 in | Wt 173.0 lb

## 2014-12-13 DIAGNOSIS — E785 Hyperlipidemia, unspecified: Secondary | ICD-10-CM | POA: Diagnosis not present

## 2014-12-13 DIAGNOSIS — I25708 Atherosclerosis of coronary artery bypass graft(s), unspecified, with other forms of angina pectoris: Secondary | ICD-10-CM | POA: Diagnosis not present

## 2014-12-13 DIAGNOSIS — I25110 Atherosclerotic heart disease of native coronary artery with unstable angina pectoris: Secondary | ICD-10-CM | POA: Diagnosis not present

## 2014-12-13 DIAGNOSIS — I10 Essential (primary) hypertension: Secondary | ICD-10-CM | POA: Diagnosis not present

## 2014-12-13 MED ORDER — AMLODIPINE BESYLATE 5 MG PO TABS: 5.0000 mg | ORAL_TABLET | Freq: Every morning | ORAL | Status: DC

## 2014-12-13 MED ORDER — ISOSORBIDE MONONITRATE ER 60 MG PO TB24: 60.0000 mg | ORAL_TABLET | Freq: Every day | ORAL | Status: DC

## 2014-12-13 NOTE — Patient Instructions (Signed)
Medication Instructions:  Your physician has recommended you make the following change in your medication:  1. INCREASE Isosorbide MN 60mg  take one tablet by mouth daily 2. INCREASE Amlodipine 5mg  take one tablet by mouth daily  Labwork: Your physician recommends that you return for a FASTING LIPID and LIVER--nothing to eat or drink after midnight, lab opens at 7:30 AM  Testing/Procedures: No new orders.   Follow-Up: Your physician wants you to follow-up in: 6 MONTHS with Dr Burt Knack.  You will receive a reminder letter in the mail two months in advance. If you don't receive a letter, please call our office to schedule the follow-up appointment.   Any Other Special Instructions Will Be Listed Below (If Applicable).     If you need a refill on your cardiac medications before your next appointment, please call your pharmacy.

## 2014-12-13 NOTE — Progress Notes (Signed)
Cardiology Office Note Date:  12/13/2014   ID:  [name], DOB 1945/11/27, MRN [id number]  PCP:  Allean Found, MD  Cardiologist:  Sherren Mocha, MD    Chief Complaint  Patient presents with  . Follow-up    History of Present Illness: [name] is a 69 y.o. male who presents for follow-up of CAD. He has history of CAD S/P CABG 1993. He's undergone PCI of the vein graft obtuse marginal initially in 2009, again in 2012 and then most recently in September 2014. He's also undergone multiple PCI procedures in the native RCA. His LIMA graft has remained widely patent. He's had episodic angina at follow-up.   Today, he is here with his wife and his grandson.  He has had chest tightness when out in the cold weather. He also describes this with walking up a hill. He's had no resting symptoms. He's had no recurrence of nocturnal angina. He has associated shortness of breath with exertion. The patient has modified his diet and has lost 14 pounds since his visit earlier this year. He denies leg swelling, orthopnea, PND, or heart palpitations. He has no other complaints today.  Past Medical History  Diagnosis Date  . CAD (coronary artery disease)     a. CABG 1993;  b. PCI of SVG to OM Jan 2012 (Drug-eluting stent x 2);  c. 09/2012 Cath/PCI: LM nl, LAD sev dzs, LCX 100, RCA patent stent, VG->AM 100, LIMA->LAD nl, VG->OM 95p ISR (PTCA only), patent distal stent.  . History of nephrolithiasis   . Stricture and stenosis of esophagus   . Degeneration of lumbar or lumbosacral intervertebral disc   . Hyperlipidemia   . Hypertension   . Anemia   . Blood transfusion   . GERD (gastroesophageal reflux disease)   . Hyperplastic colon polyp   . CKD (chronic kidney disease), stage III   . Complication of anesthesia   . PONV (postoperative nausea and vomiting)   . Myocardial infarction (HCC)     hx of five   . H/O hiatal hernia   . [id number])     Past Surgical History  Procedure Laterality  Date  . Coronary artery bypass graft  1993  . Lithotripsy    . Angioplasty    . Colonoscopy    . Lumbar back    . Back surgery    . Cardiac catheterization    . Nephrolithotomy Right 09/17/2013    Procedure: NEPHROLITHOTOMY PERCUTANEOUS;  Surgeon: Jorja Loa, MD;  Location: [facility];  Service: Urology;  Laterality: Right;  . Holmium laser application Right [id number]    Procedure: HOLMIUM LASER APPLICATION;  Surgeon: Jorja Loa, MD;  Location: [facility];  Service: Urology;  Laterality: Right;  . Left heart catheterization with coronary/graft angiogram N/A 09/23/2012    Procedure: LEFT HEART CATHETERIZATION WITH Beatrix Fetters;  Surgeon: Peter M Martinique, MD;  Location: [facility] CATH LAB;  Service: Cardiovascular;  Laterality: N/A;  . Percutaneous coronary intervention-balloon only  09/23/2012    Procedure: PERCUTANEOUS CORONARY INTERVENTION-BALLOON ONLY;  Surgeon: Peter M Martinique, MD;  Location: [facility] CATH LAB;  Service: Cardiovascular;;  OM vein graft    Current Outpatient Prescriptions  Medication Sig Dispense Refill  . amLODipine (NORVASC) 5 MG tablet Take 1 tablet (5 mg total) by mouth every morning. 90 tablet 3  . aspirin 325 MG tablet Take 325 mg by mouth daily.    Marland Kitchen atorvastatin (LIPITOR) 40 MG tablet Take 1 tablet (40 mg total) by mouth every morning. [place]  tablet 1  . clopidogrel (PLAVIX) 75 MG tablet Take 1 tablet (75 mg total) by mouth at bedtime. 90 tablet 3  . ferrous sulfate 325 (65 FE) MG tablet Take 325 mg by mouth daily with breakfast.    . isosorbide mononitrate (IMDUR) 60 MG 24 hr tablet Take 1 tablet (60 mg total) by mouth daily. 90 tablet 3  . metoprolol (LOPRESSOR) 50 MG tablet Take 1 tablet (50 mg total) by mouth 2 (two) times daily. 60 tablet 1  . nitroGLYCERIN (NITROSTAT) 0.4 MG SL tablet Place 0.4 mg under the tongue every 5 (five) minutes as needed for chest pain. Up to 3 doses    . pantoprazole (PROTONIX) 40 MG tablet Take 1 tablet (40 mg total) by mouth  daily. 90 tablet 3   Current Facility-Administered Medications  Medication Dose Route Frequency Provider Last Rate Last Dose  . triamcinolone acetonide (KENALOG) 10 MG/ML injection 10 mg  10 mg Other Once Harriet Masson, DPM      . triamcinolone acetonide (KENALOG) 10 MG/ML injection 10 mg  10 mg Other Once Harriet Masson, DPM        Allergies:   Review of patient's allergies indicates no known allergies.   Social History:  The patient  reports that he quit smoking about 27 years ago. He has quit using smokeless tobacco. He reports that he does not drink alcohol or use illicit drugs.   Family History:  The patient's  family history includes CAD (age of onset: 75) in his other and other; Coronary artery disease (age of onset: 72) in his mother; Kidney cancer in his mother; Lung cancer in his brother and sister. There is no history of Colon cancer.    ROS:  Please see the history of present illness.  Otherwise, review of systems is positive for  Chest pain, hearing loss, exertional dyspnea, back pain, leg pain.  All other systems are reviewed and negative.    PHYSICAL EXAM: VS:  BP 142/80 mmHg  Pulse 68  Ht 5\' 6"  (1.676 m)  Wt 173 lb (78.472 kg)  BMI 27.94 kg/m2 , BMI Body mass index is 27.94 kg/(m^2). GEN: Well nourished, well developed, in no acute distress HEENT: normal Neck: no JVD, no masses. No carotid bruits Cardiac: RRR without murmur or gallop                Respiratory:  clear to auscultation bilaterally, normal work of breathing GI: soft, nontender, nondistended, + BS MS: no deformity or atrophy Ext: no pretibial edema, pedal pulses 2+= bilaterally Skin: warm and dry, no rash Neuro:  Strength and sensation are intact Psych: euthymic mood, full affect  EKG:  EKG is ordered today. The ekg ordered today shows  Normal sinus rhythm 68 bpm , nonspecific ST abnormality, otherwise within normal limits.  Recent Labs: No results found for requested labs within last 365 days.    Lipid Panel     Component Value Date/Time   CHOL 209* 12/02/2013 0922   TRIG 211.0* 12/02/2013 0922   HDL 22.00* 12/02/2013 0922   CHOLHDL 10 12/02/2013 0922   VLDL 42.2* 12/02/2013 0922   LDLCALC 80 09/23/2012 0505   LDLDIRECT 122.5 12/02/2013 0922      Wt Readings from Last 3 Encounters:  12/13/14 173 lb (78.472 kg)  04/08/14 187 lb (84.823 kg)  11/30/13 186 lb (84.369 kg)     ASSESSMENT AND PLAN: 1.   CAD, bypass graft disease, with angina. The patient has angina with exertion, CCS  functional class II symptoms. I reviewed his medical program and will increase amlodipine to 5 mg daily and increase isosorbide to 60 mg daily. Other medications will be continued without change. If progressive chest pain symptoms he will contact me. Otherwise I will see him back in 6 months. He should continue on long-term dual antiplatelet therapy with aspirin and clopidogrel.   2. Essential hypertension : blood pressure is borderline. Medication changes as above.  3. Hyperlipidemia: Reviewed most recent lipids. When they were drawn, he had interrupted his statin drug. He is now taking it on a regular basis and we will update lipids and LFTs.  Current medicines are reviewed with the patient today.  The patient does not have concerns regarding medicines.  Labs/ tests ordered today include:   Orders Placed This Encounter  Procedures  . Lipid panel  . Hepatic function panel  . EKG 12-Lead    Disposition:   FU 6 months  Signed, Sherren Mocha, MD  12/13/2014 4:41 PM    [facility] [place], [place], [place]  [address] Phone: [phone]; Fax: [phone]

## 2014-12-14 ENCOUNTER — Other Ambulatory Visit: Payer: Self-pay

## 2014-12-14 NOTE — Telephone Encounter (Signed)
[name], CMA at 11/10/2014 4:43 PM     Status: Signed       Expand All Collapse All   Douglas Mocha, MD at 04/08/2014 3:56 PM  metoprolol (LOPRESSOR) 50 MG tabletTake 1 tablet (50 mg total) by mouth 2 (two) times daily 2. Essential hypertension: On blood pressure readings have been controlled. He will continue on amlodipine and metoprolol

## 2015-01-11 ENCOUNTER — Other Ambulatory Visit: Payer: Self-pay | Admitting: *Deleted

## 2015-01-11 ENCOUNTER — Other Ambulatory Visit (INDEPENDENT_AMBULATORY_CARE_PROVIDER_SITE_OTHER): Payer: Medicare Other | Admitting: *Deleted

## 2015-01-11 DIAGNOSIS — I10 Essential (primary) hypertension: Secondary | ICD-10-CM | POA: Diagnosis not present

## 2015-01-11 DIAGNOSIS — I25110 Atherosclerotic heart disease of native coronary artery with unstable angina pectoris: Secondary | ICD-10-CM

## 2015-01-11 DIAGNOSIS — E785 Hyperlipidemia, unspecified: Secondary | ICD-10-CM | POA: Diagnosis not present

## 2015-01-11 DIAGNOSIS — I2510 Atherosclerotic heart disease of native coronary artery without angina pectoris: Secondary | ICD-10-CM | POA: Diagnosis not present

## 2015-01-11 DIAGNOSIS — I25708 Atherosclerosis of coronary artery bypass graft(s), unspecified, with other forms of angina pectoris: Secondary | ICD-10-CM | POA: Diagnosis not present

## 2015-01-11 LAB — LIPID PANEL
CHOL/HDL RATIO: 4.4 ratio (ref <=5.0)
Cholesterol: 115 mg/dL — ABNORMAL LOW (ref 125–200)
HDL: 26 mg/dL — AB (ref >=40)
LDL Cholesterol: 65 mg/dL (ref <130)
Triglycerides: 119 mg/dL (ref <150)
VLDL: 24 mg/dL (ref <30)

## 2015-01-11 LAB — HEPATIC FUNCTION PANEL
ALBUMIN: 3.8 g/dL (ref 3.6–5.1)
ALT: 15 U/L (ref 9–46)
AST: 21 U/L (ref 10–35)
Alkaline Phosphatase: 74 U/L (ref 40–115)
BILIRUBIN TOTAL: 0.3 mg/dL (ref 0.2–1.2)
Bilirubin, Direct: <0.1 mg/dL (ref <=0.2)
Total Protein: 6.9 g/dL (ref 6.1–8.1)

## 2015-01-11 NOTE — Addendum Note (Signed)
Addended by: Aki Abalos K on: 01/11/2015 08:10 AM   Modules accepted: Orders  

## 2015-01-11 NOTE — Addendum Note (Signed)
Addended by: Eulis Foster on: 01/11/2015 08:19 AM   Modules accepted: Orders

## 2015-01-11 NOTE — Addendum Note (Signed)
Addended by: Eulis Foster on: 01/11/2015 08:09 AM   Modules accepted: Orders

## 2015-01-14 ENCOUNTER — Telehealth: Payer: Self-pay | Admitting: Cardiovascular Disease

## 2015-01-14 NOTE — Telephone Encounter (Signed)
Notified of lab results. 

## 2015-01-14 NOTE — Telephone Encounter (Signed)
New message ° ° ° ° °Returning a nurses call to get lab results °

## 2015-02-16 ENCOUNTER — Other Ambulatory Visit: Payer: Self-pay | Admitting: Cardiovascular Disease

## 2015-03-17 DIAGNOSIS — I2510 Atherosclerotic heart disease of native coronary artery without angina pectoris: Secondary | ICD-10-CM | POA: Diagnosis not present

## 2015-03-17 DIAGNOSIS — E119 Type 2 diabetes mellitus without complications: Secondary | ICD-10-CM | POA: Diagnosis not present

## 2015-03-17 DIAGNOSIS — M25551 Pain in right hip: Secondary | ICD-10-CM | POA: Diagnosis not present

## 2015-03-17 DIAGNOSIS — I10 Essential (primary) hypertension: Secondary | ICD-10-CM | POA: Diagnosis not present

## 2015-03-17 DIAGNOSIS — E785 Hyperlipidemia, unspecified: Secondary | ICD-10-CM | POA: Diagnosis not present

## 2015-04-14 ENCOUNTER — Other Ambulatory Visit: Payer: Self-pay | Admitting: Cardiovascular Disease

## 2015-05-31 ENCOUNTER — Other Ambulatory Visit: Payer: Self-pay | Admitting: *Deleted

## 2015-05-31 MED ORDER — ATORVASTATIN CALCIUM 40 MG PO TABS: 40.0000 mg | ORAL_TABLET | Freq: Every morning | ORAL | Status: DC

## 2015-05-31 MED ORDER — CLOPIDOGREL BISULFATE 75 MG PO TABS: 75.0000 mg | ORAL_TABLET | Freq: Every day | ORAL | Status: DC

## 2015-06-03 ENCOUNTER — Other Ambulatory Visit: Payer: Self-pay | Admitting: Cardiovascular Disease

## 2015-06-03 MED ORDER — NITROGLYCERIN 0.4 MG SL SUBL: 0.4000 mg | SUBLINGUAL_TABLET | SUBLINGUAL | Status: DC | PRN

## 2015-07-27 DIAGNOSIS — Z23 Encounter for immunization: Secondary | ICD-10-CM | POA: Diagnosis not present

## 2015-07-27 DIAGNOSIS — I2510 Atherosclerotic heart disease of native coronary artery without angina pectoris: Secondary | ICD-10-CM | POA: Diagnosis not present

## 2015-07-27 DIAGNOSIS — E785 Hyperlipidemia, unspecified: Secondary | ICD-10-CM | POA: Diagnosis not present

## 2015-07-27 DIAGNOSIS — E119 Type 2 diabetes mellitus without complications: Secondary | ICD-10-CM | POA: Diagnosis not present

## 2015-07-27 DIAGNOSIS — I10 Essential (primary) hypertension: Secondary | ICD-10-CM | POA: Diagnosis not present

## 2015-12-06 ENCOUNTER — Other Ambulatory Visit: Payer: Self-pay | Admitting: Cardiovascular Disease

## 2015-12-06 DIAGNOSIS — I25110 Atherosclerotic heart disease of native coronary artery with unstable angina pectoris: Secondary | ICD-10-CM

## 2015-12-06 DIAGNOSIS — I10 Essential (primary) hypertension: Secondary | ICD-10-CM

## 2015-12-06 MED ORDER — ISOSORBIDE MONONITRATE ER 60 MG PO TB24: 60.0000 mg | ORAL_TABLET | Freq: Every day | ORAL | 0 refills | Status: DC

## 2015-12-06 MED ORDER — CLOPIDOGREL BISULFATE 75 MG PO TABS: 75.0000 mg | ORAL_TABLET | Freq: Every day | ORAL | 0 refills | Status: DC

## 2015-12-21 ENCOUNTER — Other Ambulatory Visit: Payer: Self-pay | Admitting: *Deleted

## 2015-12-21 DIAGNOSIS — E785 Hyperlipidemia, unspecified: Secondary | ICD-10-CM

## 2015-12-21 DIAGNOSIS — I25110 Atherosclerotic heart disease of native coronary artery with unstable angina pectoris: Secondary | ICD-10-CM

## 2015-12-21 DIAGNOSIS — I10 Essential (primary) hypertension: Secondary | ICD-10-CM

## 2015-12-21 MED ORDER — AMLODIPINE BESYLATE 5 MG PO TABS: 5.0000 mg | ORAL_TABLET | Freq: Every morning | ORAL | 0 refills | Status: DC

## 2015-12-23 ENCOUNTER — Telehealth: Payer: Self-pay | Admitting: Cardiovascular Disease

## 2015-12-23 ENCOUNTER — Other Ambulatory Visit: Payer: Self-pay | Admitting: *Deleted

## 2015-12-23 MED ORDER — ATORVASTATIN CALCIUM 40 MG PO TABS: 40.0000 mg | ORAL_TABLET | Freq: Every morning | ORAL | 0 refills | Status: DC

## 2015-12-23 NOTE — Telephone Encounter (Signed)
New Message    *STAT* If patient is at the pharmacy, call can be transferred to refill team.   1. Which medications need to be refilled? (please list name of each medication and dose if known) atorvastatin (LIPITOR) 40 MG tablet  2. Which pharmacy/location (including street and city if local pharmacy) is medication to be sent to? [facility] [id number]  3. Do they need a 30 day or 90 day supply? 90 day supply  Patient appointment scheduled

## 2015-12-27 ENCOUNTER — Encounter: Payer: Self-pay | Admitting: *Deleted

## 2015-12-28 NOTE — Progress Notes (Signed)
Cardiology Office Note    Date:  12/30/2015   ID:  [name], DOB [birth year], MRN [id number]  PCP:  Allean Found, MD  Cardiologist:  Dr. Burt Knack  Chief Complaint: 12 Months follow up  History of Present Illness:   [name] is a 70 y.o. male CAD s/p CABG and PCI afterwards, HTN, HLD, CKD stage II and GERD who presented for follow up.   He has history of CAD S/P CABG 1993. He's undergone PCI of the vein graft obtuse marginal initially in 2009, again in 2012 and then most recently in September 2014. He's also undergone multiple PCI procedures in the native RCA. His LIMA graft has remained widely patent. He's had episodic angina at follow-up.   Last seen by Dr. Burt Knack 12/13/14. Amlodipine and Imdur increased due to CCS.   Here today for follow up. He continue to have exertional chest tightness especially climbing up hill. He need to stop twice. No associated nausea, vomiting, diaphoresis or radiation. 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 He has taken SL nitro probably 4-6 times this year. No dizziness, syncope, LE edema, orthopnea, PND, melena or blood in his stool   Past Medical History:  Diagnosis Date  . Anemia   . Blood transfusion   . CAD (coronary artery disease)    a. CABG 1993;  b. PCI of SVG to OM Jan 2012 (Drug-eluting stent x 2);  c. 09/2012 Cath/PCI: LM nl, LAD sev dzs, LCX 100, RCA patent stent, VG->AM 100, LIMA->LAD nl, VG->OM 95p ISR (PTCA only), patent distal stent.  . CKD (chronic kidney disease), stage III   . Complication of anesthesia   . Degeneration of lumbar or lumbosacral intervertebral disc   . GERD (gastroesophageal reflux disease)   . H/O hiatal hernia   . Headache(784.0)   . History of nephrolithiasis   . Hyperlipidemia   . Hyperplastic colon polyp   . Hypertension   . Myocardial infarction    hx of five   . PONV (postoperative nausea and vomiting)   . Stricture and stenosis of esophagus     Past  Surgical History:  Procedure Laterality Date  . ANGIOPLASTY    . BACK SURGERY    . CARDIAC CATHETERIZATION    . COLONOSCOPY    . CORONARY ARTERY BYPASS GRAFT  1993  . HOLMIUM LASER APPLICATION Right [id number]   Procedure: HOLMIUM LASER APPLICATION;  Surgeon: Jorja Loa, MD;  Location: [facility];  Service: Urology;  Laterality: Right;  . LEFT HEART CATHETERIZATION WITH CORONARY/GRAFT ANGIOGRAM N/A 09/23/2012   Procedure: LEFT HEART CATHETERIZATION WITH Beatrix Fetters;  Surgeon: Peter M Martinique, MD;  Location: [facility] CATH LAB;  Service: Cardiovascular;  Laterality: N/A;  . LITHOTRIPSY    . Lumbar back    . NEPHROLITHOTOMY Right 09/17/2013   Procedure: NEPHROLITHOTOMY PERCUTANEOUS;  Surgeon: Jorja Loa, MD;  Location: [facility];  Service: Urology;  Laterality: Right;  . PERCUTANEOUS CORONARY INTERVENTION-BALLOON ONLY  09/23/2012   Procedure: PERCUTANEOUS CORONARY INTERVENTION-BALLOON ONLY;  Surgeon: Peter M Martinique, MD;  Location: [facility] CATH LAB;  Service: Cardiovascular;;  OM vein graft    Current Medications:  Prior to Admission medications   Medication Sig Start Date End Date Taking? Authorizing Provider  amLODipine (NORVASC) 5 MG tablet Take 1 tablet (5 mg total) by mouth every morning. 12/21/15  Yes Sherren Mocha, MD  aspirin 325 MG tablet Take 325 mg by mouth daily.   Yes Historical Provider, MD  clopidogrel (  PLAVIX) 75 MG tablet Take 1 tablet (75 mg total) by mouth at bedtime. 12/06/15  Yes Sherren Mocha, MD  ferrous sulfate 325 (65 FE) MG tablet Take 325 mg by mouth daily with breakfast.   Yes Historical Provider, MD  metoprolol (LOPRESSOR) 50 MG tablet Take 1 tablet (50 mg total) by mouth 2 (two) times daily. 02/16/15  Yes Sherren Mocha, MD  nitroGLYCERIN (NITROSTAT) 0.4 MG SL tablet Place 1 tablet (0.4 mg total) under the tongue every 5 (five) minutes as needed for chest pain. Up to 3 doses 06/03/15  Yes Sherren Mocha, MD  pantoprazole (PROTONIX) 40 MG tablet TAKE ONE  (1) TABLET EACH DAY 04/14/15  Yes Sherren Mocha, MD  rosuvastatin (CRESTOR) 20 MG tablet Take 20 mg by mouth daily. 09/26/15  Yes Historical Provider, MD  isosorbide mononitrate (IMDUR) 60 MG 24 hr tablet Take 1.5 tablets (90 mg total) by mouth daily. 12/30/15 03/29/16  Leanor Kail, PA   Allergies:   Patient has no known allergies.   Social History   Social History  . Marital status: Married    Spouse name: N/A  . Number of children: 2  . Years of education: N/A   Occupational History  . Retired Scientist, research (life sciences)   Social History Main Topics  . Smoking status: Former Smoker    Quit date: 01/06/1987  . Smokeless tobacco: Former Systems developer  . Alcohol use No  . Drug use: No  . Sexual activity: Not Asked   Other Topics Concern  . None   Social History Narrative  . None     Family History:  The patient's family history includes CAD (age of onset: 52) in his other and other; Coronary artery disease (age of onset: 29) in his mother; Kidney cancer in his mother; Lung cancer in his brother and sister.   ROS:   Please see the history of present illness.    ROS All other systems reviewed and are negative.   PHYSICAL EXAM:   VS:  BP 132/62   Pulse (!) 52   Ht 5\' 6"  (1.676 m)   Wt 185 lb (83.9 kg)   BMI 29.86 kg/m    GEN: Well nourished, well developed, in no acute distress  HEENT: normal  Neck: no JVD, carotid bruits, or masses Cardiac:RRR; no murmurs, rubs, or gallops,no edema  Respiratory:  clear to auscultation bilaterally, normal work of breathing GI: soft, nontender, nondistended, + BS MS: no deformity or atrophy  Skin: warm and dry, no rash Neuro:  Alert and Oriented x 3, Strength and sensation are intact Psych: euthymic mood, full affect  Wt Readings from Last 3 Encounters:  12/30/15 185 lb (83.9 kg)  12/13/14 173 lb (78.5 kg)  04/08/14 187 lb (84.8 kg)      Studies/Labs Reviewed:   EKG:  EKG is ordered today.  The ekg ordered today demonstrates  sinus rhythm at rate of 52 bpm. No acute changes.   Recent Labs: 01/11/2015: ALT 15   Lipid Panel    Component Value Date/Time   CHOL 115 (L) 01/11/2015 0820   TRIG 119 01/11/2015 0820   HDL 26 (L) 01/11/2015 0820   CHOLHDL 4.4 01/11/2015 0820   VLDL 24 01/11/2015 0820   LDLCALC 65 01/11/2015 0820   LDLDIRECT 122.5 12/02/2013 0922    Additional studies/ records that were reviewed today include:   Echocardiogram: 01/09/2010 Study Conclusions Left ventricle: The cavity size was normal. Wall thickness was increased in a pattern of mild  LVH. Systolic function was normal. The estimated ejection fraction was in the range of 60% to 65%. Wall motion was normal; there were no regional wall motion abnormalities. Doppler parameters are consistent with abnormal left ventricular relaxation (grade 1 diastolic dysfunction).   Cardiac Catheterization: 09/23/2012 Hemodynamics:   LV pressure: 145/9 Aortic pressure: 141/65  Angiography   Left Main: moderate in size, mild  diffuse disease  Left anterior Descending: severely diseased diffuse disease and the occluded after giving off several small and severely diseased diags.    Left Circumflex: occluded  Right Coronary Artery: moderate and dominant.  The long stent has minimal luminal instent restenosis  LV Gram: not performed ( to save contrast) ,  AV crossed for pressures only    Large graft.  The proximal stent ( 3.0 x 23 Promus)  has a tight, eccentric 95% stenosis.  The distal SVG stent ( 3.5 x 23 Promus) is minimal restenosis.  SVG to  Acute marginal: chronically occluded.   LIMA to LAD:  Normal graft.  Anastomosis to the LAD is normal.  The diagonal  fills in a retrograde fashion.  The LAD provides collaterals to the LCx system.  Complications: No apparent complications Patient did tolerate procedure well.  Contrast used: 40 cc   Conclusions:   1. Severe native CAD. 2. Patent LIMA to LAD 3. Occluded SVG  to Acute marginal  4. The SVG to OM has a tight stenosis in the proximal stent.  This lesion has been re-dilated in the past.  Will discuss with Dr. Martinique.  He may need restenting of this focal area of restenosis.  Cath 09/23/2012 Lesion Data: Vessel: SVG to OM Percent stenosis (pre): 95% TIMI-flow (pre):  2 Balloon angioplasty only Percent stenosis (post): 0% TIMI-flow (post): 3  Conclusions: Successful PTCA of the SVG to the OM for in stent restenosis.  Recommendations: continue dual antiplatelet therapy indefinitely.  ASSESSMENT & PLAN:    1. CAD s/p CABG followed by multiple PCI as above - He does have exertional anginal symptoms. This has been stable. Will increase his imdur to 90 mg qd. Last ischemic evaluation 09/2012. Discussed likely need to cath in future. Will defer until next OV with Dr. Burt Knack in 3 months. He will let us know if symptoms gets worse.   2. HTN - Stable and well controlled on current medications.  3. HLD - 01/11/2015: Cholesterol 115; HDL 26; LDL Cholesterol 65; Triglycerides 119; VLDL 24  - Followed by PCP. He wish to followed by [place]. Will get lipid panel and LFT during next OV.  Continue statin.    Medication Adjustments/Labs and Tests Ordered: Current medicines are reviewed at length with the patient today.  Concerns regarding medicines are outlined above.  Medication changes, Labs and Tests ordered today are listed in the Patient Instructions below. Patient Instructions  Medication Instructions:  1. INCREASE IMDUR TO 90 MG DAILY; THIS WILL BE 1 AND 1/2 TABS DAILY; NEW RX HAS BEEN SENT IN WITH THE NEW DIRECTIONS  Labwork: NONE  Testing/Procedures: NONE  Follow-Up: DR. Burt Knack IN 3 MONTHS; WE WILL SEND OUT A REMINDER LETTER TO HAVE YOU CALL AND MAKE AN APPT  Any Other Special Instructions Will Be Listed Below (If Applicable).     If you need a refill on your cardiac medications before your next appointment, please call your pharmacy.        Jarrett Soho, [place]  12/30/2015 8:48 AM    [place],  , [place]  [address] Phone: [phone]; Fax: [phone]  

## 2015-12-30 ENCOUNTER — Encounter: Payer: Self-pay | Admitting: Physician Assistant

## 2015-12-30 ENCOUNTER — Ambulatory Visit (INDEPENDENT_AMBULATORY_CARE_PROVIDER_SITE_OTHER): Payer: Medicare Other | Admitting: Physician Assistant

## 2015-12-30 VITALS — BP 132/62 | HR 52 | Ht 66.0 in | Wt 185.0 lb

## 2015-12-30 DIAGNOSIS — E785 Hyperlipidemia, unspecified: Secondary | ICD-10-CM

## 2015-12-30 DIAGNOSIS — I200 Unstable angina: Secondary | ICD-10-CM

## 2015-12-30 DIAGNOSIS — I25110 Atherosclerotic heart disease of native coronary artery with unstable angina pectoris: Secondary | ICD-10-CM

## 2015-12-30 DIAGNOSIS — I25708 Atherosclerosis of coronary artery bypass graft(s), unspecified, with other forms of angina pectoris: Secondary | ICD-10-CM

## 2015-12-30 DIAGNOSIS — I10 Essential (primary) hypertension: Secondary | ICD-10-CM

## 2015-12-30 MED ORDER — METOPROLOL TARTRATE 50 MG PO TABS
50.0000 mg | ORAL_TABLET | Freq: Two times a day (BID) | ORAL | 3 refills | Status: DC
Start: 2015-12-30 — End: 2017-01-08

## 2015-12-30 MED ORDER — NITROGLYCERIN 0.4 MG SL SUBL: 0.4000 mg | SUBLINGUAL_TABLET | SUBLINGUAL | 1 refills | Status: DC | PRN

## 2015-12-30 MED ORDER — ISOSORBIDE MONONITRATE ER 60 MG PO TB24: 90.0000 mg | ORAL_TABLET | Freq: Every day | ORAL | 3 refills | Status: DC

## 2015-12-30 MED ORDER — ROSUVASTATIN CALCIUM 20 MG PO TABS: 20.0000 mg | ORAL_TABLET | Freq: Every day | ORAL | 3 refills | Status: DC

## 2015-12-30 MED ORDER — AMLODIPINE BESYLATE 5 MG PO TABS: 5.0000 mg | ORAL_TABLET | Freq: Every morning | ORAL | 3 refills | Status: DC

## 2015-12-30 MED ORDER — CLOPIDOGREL BISULFATE 75 MG PO TABS: 75.0000 mg | ORAL_TABLET | Freq: Every day | ORAL | 3 refills | Status: DC

## 2015-12-30 NOTE — Patient Instructions (Addendum)
Medication Instructions:  1. INCREASE IMDUR TO 90 MG DAILY; THIS WILL BE 1 AND 1/2 TABS DAILY; NEW RX HAS BEEN SENT IN WITH THE NEW DIRECTIONS  Labwork: NONE  Testing/Procedures: NONE  Follow-Up: DR. Burt Knack IN 3 MONTHS; WE WILL SEND OUT A REMINDER LETTER TO HAVE YOU CALL AND MAKE AN APPT  Any Other Special Instructions Will Be Listed Below (If Applicable).     If you need a refill on your cardiac medications before your next appointment, please call your pharmacy.

## 2015-12-30 NOTE — Addendum Note (Signed)
Addended by: Michae Kava on: 12/30/2015 08:51 AM   Modules accepted: Orders

## 2016-03-12 ENCOUNTER — Telehealth: Payer: Self-pay | Admitting: Cardiovascular Disease

## 2016-03-12 NOTE — Telephone Encounter (Signed)
Patient wife is calling ([name]) is calling states that Friday night they were walking up a hill and he walked a short distance and stated "i'm not going to make it." She states that he was SOB, fatigued and had some chest pressure. Patient has kidney stones in both kidneys and is having issues there. Patient is scheduled to see Dr. Burt Knack 05-03-16, was told to see Dr. Burt Knack directly. Please call to discuss, thanks.

## 2016-03-12 NOTE — Telephone Encounter (Signed)
I spoke with the pt's wife and she said they attended their grandson's play Friday night and when walking up an incline to the parking lot the pt said he was not going to make it.  The pt had to stop and rest because he could not catch his breath and he was having chest tightness. This is the same as what the pt was experiencing at his last appointment. The pt would like to see Dr Burt Knack and I have scheduled an appointment on 03/16/2016 at 12:00. I advised that the pt proceed to ER if symptoms worsen prior to appt.  Pt's wife agreed with plan.

## 2016-03-16 ENCOUNTER — Encounter: Payer: Self-pay | Admitting: Cardiovascular Disease

## 2016-03-16 ENCOUNTER — Ambulatory Visit (INDEPENDENT_AMBULATORY_CARE_PROVIDER_SITE_OTHER): Payer: Medicare Other | Admitting: Cardiovascular Disease

## 2016-03-16 VITALS — BP 130/68 | HR 56 | Ht 66.0 in | Wt 186.2 lb

## 2016-03-16 DIAGNOSIS — I209 Angina pectoris, unspecified: Secondary | ICD-10-CM

## 2016-03-16 LAB — BASIC METABOLIC PANEL
BUN/Creatinine Ratio: 13 (ref 10–24)
BUN: 21 mg/dL (ref 8–27)
CALCIUM: 8.9 mg/dL (ref 8.6–10.2)
CHLORIDE: 101 mmol/L (ref 96–106)
CO2: 26 mmol/L (ref 18–29)
Creatinine, Ser: 1.64 mg/dL — ABNORMAL HIGH (ref 0.76–1.27)
GFR calc Af Amer: 49 mL/min/{1.73_m2} — ABNORMAL LOW (ref >59)
GFR calc non Af Amer: 42 mL/min/{1.73_m2} — ABNORMAL LOW (ref >59)
Glucose: 205 mg/dL — ABNORMAL HIGH (ref 65–99)
POTASSIUM: 4.9 mmol/L (ref 3.5–5.2)
Sodium: 135 mmol/L (ref 134–144)

## 2016-03-16 LAB — CBC
HEMATOCRIT: 35.7 % — AB (ref 37.5–51.0)
HEMOGLOBIN: 12.1 g/dL — AB (ref 13.0–17.7)
MCH: 27.2 pg (ref 26.6–33.0)
MCHC: 33.9 g/dL (ref 31.5–35.7)
MCV: 80 fL (ref 79–97)
Platelets: 204 10*3/uL (ref 150–379)
RBC: 4.45 x10E6/uL (ref 4.14–5.80)
RDW: 15.4 % (ref 12.3–15.4)
WBC: 6.2 10*3/uL (ref 3.4–10.8)

## 2016-03-16 LAB — PROTIME-INR
INR: 0.9 (ref 0.8–1.2)
PROTHROMBIN TIME: 10.1 s (ref 9.1–12.0)

## 2016-03-16 NOTE — Patient Instructions (Addendum)
Medication Instructions:  Your physician recommends that you continue on your current medications as directed. Please refer to the Current Medication list given to you today.  Labwork: Your physician recommends that you have lab work today: BMP, CBC and PT/INR  Testing/Procedures: Your physician has requested that you have a cardiac catheterization. Cardiac catheterization is used to diagnose and/or treat various heart conditions. Doctors may recommend this procedure for a number of different reasons. The most common reason is to evaluate chest pain. Chest pain can be a symptom of coronary artery disease (CAD), and cardiac catheterization can show whether plaque is narrowing or blocking your heart's arteries. This procedure is also used to evaluate the valves, as well as measure the blood flow and oxygen levels in different parts of your heart. For further information please visit [email or web address]. Please follow instruction sheet, as given.    [facility] [address] [place] [address] Dept: [phone] Loc: [phone]  [name]  03/16/2016  You are scheduled for a Cardiac Catheterization on Monday, March 19 with Dr. Sherren Mocha.  1. Please arrive at the [facility] (Main Entrance A) at [facility]: [place], [place] [address] at 10:00 AM (two hours before your procedure to ensure your preparation). Free valet parking service is available.   Special note: Every effort is made to have your procedure done on time. Please understand that emergencies sometimes delay scheduled procedures.  2. Diet: Do not eat or drink anything after midnight prior to your procedure except sips of water to take medications.  3. Labs: Labs will be drawn in the office today.   4. Medication instructions in preparation for your procedure:  On the morning of your procedure, take  your Aspirin and Plavix/Clopidogrel and any morning medicines NOT listed above.  You may use sips of water.  5. Plan for one night stay--bring personal belongings.  6. Bring a current list of your medications and current insurance cards.  7. You MUST have a responsible person to drive you home.  8. Someone MUST be with you the first 24 hours after you arrive home or your discharge will be delayed.  9. Please wear clothes that are easy to get on and off and wear slip-on shoes.  Thank you for allowing [place] to care for you!   -- [facility]   Follow-Up: We will arrange further follow-up after cardiac catheterization.  Any Other Special Instructions Will Be Listed Below (If Applicable).     If you need a refill on your cardiac medications before your next appointment, please call your pharmacy.

## 2016-03-16 NOTE — Progress Notes (Signed)
Cardiology Office Note Date:  03/16/2016   ID:  [name], DOB 22-Jan-1945, MRN [id number]  PCP:  Allean Found, MD  Cardiologist:  Sherren Mocha, MD    Chief Complaint  Patient presents with  . Chest Pain  . Shortness of Breath     History of Present Illness: [name] is a 71 y.o. male who presents for follow-up of CAD s/p CABG and PCI afterwards, HTN, HLD, CKD stage II and GERD who presented for follow up.   He has history of CAD S/P CABG 1993. He's undergone PCI of the vein graft obtuse marginal initially in 2009, again in 2012 and then most recently in 2014. He's also undergone multiple PCI procedures in the native RCA. His LIMA graft has remained widely patent. He's had episodic angina at follow-up.   The patient is here with his wife today. He reports progressive exercise intolerance and shortness of breath with physical activity. He had an episode a few days ago where he had marked shortness of breath with walking up a hill. He had to stop and rest. He took nitroglycerin and symptoms resolved. This is been his anginal equivalent in the past. Symptoms have slowly progressed over the last 4-6 weeks. He has had no resting symptoms. He is now using nitroglycerin about 3 days per week. He denies chest pain. He denies edema, orthopnea, or PND. Symptoms are primarily shortness of breath and marked fatigue.  Past Medical History:  Diagnosis Date  . Anemia   . Blood transfusion   . CAD (coronary artery disease)    a. CABG 1993;  b. PCI of SVG to OM Jan 2012 (Drug-eluting stent x 2);  c. 09/2012 Cath/PCI: LM nl, LAD sev dzs, LCX 100, RCA patent stent, VG->AM 100, LIMA->LAD nl, VG->OM 95p ISR (PTCA only), patent distal stent.  . CKD (chronic kidney disease), stage III   . Complication of anesthesia   . Degeneration of lumbar or lumbosacral intervertebral disc   . GERD (gastroesophageal reflux disease)   . H/O hiatal hernia   . Headache(784.0)   . History of nephrolithiasis   .  Hyperlipidemia   . Hyperplastic colon polyp   . Hypertension   . Myocardial infarction    hx of five   . PONV (postoperative nausea and vomiting)   . Stricture and stenosis of esophagus     Past Surgical History:  Procedure Laterality Date  . ANGIOPLASTY    . BACK SURGERY    . CARDIAC CATHETERIZATION    . COLONOSCOPY    . CORONARY ARTERY BYPASS GRAFT  1993  . HOLMIUM LASER APPLICATION Right 1/49/7026   Procedure: HOLMIUM LASER APPLICATION;  Surgeon: Jorja Loa, MD;  Location: [facility];  Service: Urology;  Laterality: Right;  . LEFT HEART CATHETERIZATION WITH CORONARY/GRAFT ANGIOGRAM N/A 09/23/2012   Procedure: LEFT HEART CATHETERIZATION WITH Beatrix Fetters;  Surgeon: Peter M Martinique, MD;  Location: [facility] CATH LAB;  Service: Cardiovascular;  Laterality: N/A;  . LITHOTRIPSY    . Lumbar back    . NEPHROLITHOTOMY Right 09/17/2013   Procedure: NEPHROLITHOTOMY PERCUTANEOUS;  Surgeon: Jorja Loa, MD;  Location: [facility];  Service: Urology;  Laterality: Right;  . PERCUTANEOUS CORONARY INTERVENTION-BALLOON ONLY  09/23/2012   Procedure: PERCUTANEOUS CORONARY INTERVENTION-BALLOON ONLY;  Surgeon: Peter M Martinique, MD;  Location: [facility] CATH LAB;  Service: Cardiovascular;;  OM vein graft    Current Outpatient Prescriptions  Medication Sig Dispense Refill  . amLODipine (NORVASC) 5 MG tablet Take 1 tablet (5 mg  total) by mouth every morning. 90 tablet 3  . aspirin 325 MG tablet Take 325 mg by mouth daily.    . clopidogrel (PLAVIX) 75 MG tablet Take 1 tablet (75 mg total) by mouth at bedtime. 90 tablet 3  . ferrous sulfate 325 (65 FE) MG tablet Take 325 mg by mouth daily with breakfast.    . isosorbide mononitrate (IMDUR) 60 MG 24 hr tablet Take 1.5 tablets (90 mg total) by mouth daily. 135 tablet 3  . metoprolol (LOPRESSOR) 50 MG tablet Take 1 tablet (50 mg total) by mouth 2 (two) times daily. 180 tablet 3  . nitroGLYCERIN (NITROSTAT) 0.4 MG SL tablet Place 1 tablet (0.4 mg total)  under the tongue every 5 (five) minutes as needed for chest pain. Up to 3 doses 25 tablet 1  . pantoprazole (PROTONIX) 40 MG tablet TAKE ONE (1) TABLET EACH DAY 90 tablet 3  . rosuvastatin (CRESTOR) 20 MG tablet Take 1 tablet (20 mg total) by mouth daily. 90 tablet 3   Current Facility-Administered Medications  Medication Dose Route Frequency Provider Last Rate Last Dose  . triamcinolone acetonide (KENALOG) 10 MG/ML injection 10 mg  10 mg Other Once Harriet Masson, DPM      . triamcinolone acetonide (KENALOG) 10 MG/ML injection 10 mg  10 mg Other Once Harriet Masson, DPM        Allergies:   Patient has no known allergies.   Social History:  The patient  reports that he quit smoking about 29 years ago. He has quit using smokeless tobacco. He reports that he does not drink alcohol or use drugs.   Family History:  The patient's  family history includes CAD (age of onset: 39) in his other and other; Coronary artery disease (age of onset: 75) in his mother; Kidney cancer in his mother; Lung cancer in his brother and sister.    ROS:  Please see the history of present illness.  Otherwise, review of systems is positive for chills, back pain, muscle pain, dizziness, leg pain, balance problems, excessive fatigue, excessive sweating.  All other systems are reviewed and negative.    PHYSICAL EXAM: VS:  BP 130/68   Pulse (!) 56   Ht 5\' 6"  (1.676 m)   Wt 186 lb 3.2 oz (84.5 kg)   BMI 30.05 kg/m  , BMI Body mass index is 30.05 kg/m. GEN: Well nourished, well developed, in no acute distress  HEENT: normal  Neck: no JVD, no masses. No carotid bruits Cardiac: RRR without murmur or gallop                Respiratory:  clear to auscultation bilaterally, normal work of breathing GI: soft, nontender, nondistended, + BS MS: no deformity or atrophy  Ext: no pretibial edema, pedal pulses 2+= bilaterally Skin: warm and dry, no rash Neuro:  Strength and sensation are intact Psych: euthymic mood, full  affect  EKG:  EKG is ordered today. The ekg ordered today shows sinus bradycardia 57 bpm, nonspecific ST and T wave abnormality  Recent Labs: No results found for requested labs within last 8760 hours.   Lipid Panel     Component Value Date/Time   CHOL 115 (L) 01/11/2015 0820   TRIG 119 01/11/2015 0820   HDL 26 (L) 01/11/2015 0820   CHOLHDL 4.4 01/11/2015 0820   VLDL 24 01/11/2015 0820   LDLCALC 65 01/11/2015 0820   LDLDIRECT 122.5 12/02/2013 0922      Wt Readings from Last 3 Encounters:  03/16/16 186 lb 3.2 oz (84.5 kg)  12/30/15 185 lb (83.9 kg)  12/13/14 173 lb (78.5 kg)     ASSESSMENT AND PLAN: 1.  CAD, native vessel and bypass graft disease, with CCS class III angina: The patient has progressive anginal symptoms with exertion despite maximal medical therapy on isosorbide, metoprolol, and amlodipine. He has undergone multiple pain graft interventions in the past and has extensive coronary disease. He is taking nitroglycerin more frequently and is increasingly limited. I have recommend cardiac catheterization and possible PCI. I have reviewed the risks, indications, and alternatives to cardiac catheterization, possible angioplasty, and stenting with the patient. Risks include but are not limited to bleeding, infection, vascular injury, stroke, myocardial infection, arrhythmia, kidney injury, radiation-related injury in the case of prolonged fluoroscopy use, emergency cardiac surgery, and death. The patient understands the risks of serious complication is 1-2 in 9450 with diagnostic cardiac cath and 1-2% or less with angioplasty/stenting.   2. HTN: BP controlled on current Rx.   3. Hyperlipidemia: treated with crestor  Current medicines are reviewed with the patient today.  The patient does not have concerns regarding medicines.  Labs/ tests ordered today include:  No orders of the defined types were placed in this encounter.   Disposition:   FU pending cardiac cath  results  Signed, Sherren Mocha, MD  03/16/2016 12:16 PM    [facility] [place], [place], [place]  [address] Phone: [phone]; Fax: [phone]

## 2016-03-18 ENCOUNTER — Other Ambulatory Visit: Payer: Self-pay | Admitting: Cardiovascular Disease

## 2016-03-19 ENCOUNTER — Ambulatory Visit (HOSPITAL_COMMUNITY)
Admission: RE | Admit: 2016-03-19 | Discharge: 2016-03-20 | Disposition: A | Discharge status: Home / Self Care | Payer: Medicare Other | Source: Ambulatory Visit | Attending: Cardiovascular Disease | Admitting: Cardiovascular Disease

## 2016-03-19 ENCOUNTER — Encounter (HOSPITAL_COMMUNITY): Payer: Self-pay | Admitting: Cardiovascular Disease

## 2016-03-19 ENCOUNTER — Encounter (HOSPITAL_COMMUNITY)
Admission: RE | Disposition: A | Discharge status: Home / Self Care | Payer: Self-pay | Source: Ambulatory Visit | Attending: Cardiovascular Disease

## 2016-03-19 DIAGNOSIS — K219 Gastro-esophageal reflux disease without esophagitis: Secondary | ICD-10-CM | POA: Insufficient documentation

## 2016-03-19 DIAGNOSIS — I2582 Chronic total occlusion of coronary artery: Secondary | ICD-10-CM | POA: Insufficient documentation

## 2016-03-19 DIAGNOSIS — M7732 Calcaneal spur, left foot: Secondary | ICD-10-CM

## 2016-03-19 DIAGNOSIS — Z8249 Family history of ischemic heart disease and other diseases of the circulatory system: Secondary | ICD-10-CM | POA: Insufficient documentation

## 2016-03-19 DIAGNOSIS — I25710 Atherosclerosis of autologous vein coronary artery bypass graft(s) with unstable angina pectoris: Secondary | ICD-10-CM | POA: Diagnosis not present

## 2016-03-19 DIAGNOSIS — Z7982 Long term (current) use of aspirin: Secondary | ICD-10-CM | POA: Diagnosis not present

## 2016-03-19 DIAGNOSIS — I2510 Atherosclerotic heart disease of native coronary artery without angina pectoris: Secondary | ICD-10-CM | POA: Diagnosis present

## 2016-03-19 DIAGNOSIS — K449 Diaphragmatic hernia without obstruction or gangrene: Secondary | ICD-10-CM | POA: Insufficient documentation

## 2016-03-19 DIAGNOSIS — E785 Hyperlipidemia, unspecified: Secondary | ICD-10-CM | POA: Diagnosis not present

## 2016-03-19 DIAGNOSIS — Y831 Surgical operation with implant of artificial internal device as the cause of abnormal reaction of the patient, or of later complication, without mention of misadventure at the time of the procedure: Secondary | ICD-10-CM | POA: Diagnosis not present

## 2016-03-19 DIAGNOSIS — I129 Hypertensive chronic kidney disease with stage 1 through stage 4 chronic kidney disease, or unspecified chronic kidney disease: Secondary | ICD-10-CM | POA: Diagnosis not present

## 2016-03-19 DIAGNOSIS — T82855A Stenosis of coronary artery stent, initial encounter: Secondary | ICD-10-CM | POA: Insufficient documentation

## 2016-03-19 DIAGNOSIS — Z7902 Long term (current) use of antithrombotics/antiplatelets: Secondary | ICD-10-CM | POA: Insufficient documentation

## 2016-03-19 DIAGNOSIS — Z9889 Other specified postprocedural states: Secondary | ICD-10-CM

## 2016-03-19 DIAGNOSIS — M7730 Calcaneal spur, unspecified foot: Secondary | ICD-10-CM

## 2016-03-19 DIAGNOSIS — I209 Angina pectoris, unspecified: Secondary | ICD-10-CM

## 2016-03-19 DIAGNOSIS — I25110 Atherosclerotic heart disease of native coronary artery with unstable angina pectoris: Secondary | ICD-10-CM | POA: Diagnosis not present

## 2016-03-19 DIAGNOSIS — N182 Chronic kidney disease, stage 2 (mild): Secondary | ICD-10-CM | POA: Diagnosis not present

## 2016-03-19 DIAGNOSIS — D631 Anemia in chronic kidney disease: Secondary | ICD-10-CM | POA: Diagnosis not present

## 2016-03-19 DIAGNOSIS — Z87891 Personal history of nicotine dependence: Secondary | ICD-10-CM | POA: Insufficient documentation

## 2016-03-19 DIAGNOSIS — I252 Old myocardial infarction: Secondary | ICD-10-CM | POA: Diagnosis not present

## 2016-03-19 DIAGNOSIS — M722 Plantar fascial fibromatosis: Secondary | ICD-10-CM

## 2016-03-19 DIAGNOSIS — I200 Unstable angina: Secondary | ICD-10-CM | POA: Diagnosis present

## 2016-03-19 DIAGNOSIS — M79609 Pain in unspecified limb: Secondary | ICD-10-CM

## 2016-03-19 DIAGNOSIS — I25118 Atherosclerotic heart disease of native coronary artery with other forms of angina pectoris: Secondary | ICD-10-CM | POA: Diagnosis present

## 2016-03-19 HISTORY — PX: LEFT HEART CATH AND CORS/GRAFTS ANGIOGRAPHY: CATH118250

## 2016-03-19 HISTORY — PX: CORONARY STENT INTERVENTION: CATH118234

## 2016-03-19 LAB — MRSA PCR SCREENING: MRSA by PCR: NEGATIVE

## 2016-03-19 SURGERY — LEFT HEART CATH AND CORS/GRAFTS ANGIOGRAPHY
Anesthesia: LOCAL

## 2016-03-19 MED ORDER — ANGIOPLASTY BOOK
Freq: Once | Status: DC
  Filled 2016-03-19: qty 1

## 2016-03-19 MED ORDER — NITROGLYCERIN 0.4 MG SL SUBL: 0.4000 mg | SUBLINGUAL_TABLET | SUBLINGUAL | Status: DC | PRN

## 2016-03-19 MED ORDER — AMLODIPINE BESYLATE 5 MG PO TABS
5.0000 mg | ORAL_TABLET | Freq: Every morning | ORAL | Status: DC
  Administered 2016-03-20: 08:00:00 5 mg via ORAL
  Filled 2016-03-19: qty 1

## 2016-03-19 MED ORDER — FERROUS SULFATE 325 (65 FE) MG PO TABS
325.0000 mg | ORAL_TABLET | Freq: Every day | ORAL | Status: DC
  Administered 2016-03-20: 08:00:00 325 mg via ORAL
  Filled 2016-03-19: qty 1

## 2016-03-19 MED ORDER — NITROGLYCERIN 1 MG/10 ML FOR IR/CATH LAB
INTRA_ARTERIAL | Status: DC | PRN
  Administered 2016-03-19 (×2): 200 ug via INTRACORONARY

## 2016-03-19 MED ORDER — SODIUM CHLORIDE 0.9% FLUSH
3.0000 mL | Freq: Two times a day (BID) | INTRAVENOUS | Status: DC
Start: 2016-03-19 — End: 2016-03-20
  Administered 2016-03-20: 09:00:00 3 mL via INTRAVENOUS

## 2016-03-19 MED ORDER — HEPARIN (PORCINE) IN NACL 2-0.9 UNIT/ML-% IJ SOLN
INTRAMUSCULAR | Status: AC
  Filled 2016-03-19: qty 1000

## 2016-03-19 MED ORDER — MIDAZOLAM HCL 2 MG/2ML IJ SOLN
INTRAMUSCULAR | Status: AC
  Filled 2016-03-19: qty 2

## 2016-03-19 MED ORDER — ONDANSETRON HCL 4 MG/2ML IJ SOLN
4.0000 mg | Freq: Four times a day (QID) | INTRAMUSCULAR | Status: DC | PRN
  Administered 2016-03-19: 16:00:00 4 mg via INTRAVENOUS
  Filled 2016-03-19: qty 2

## 2016-03-19 MED ORDER — VERAPAMIL HCL 2.5 MG/ML IV SOLN
INTRAVENOUS | Status: AC
  Filled 2016-03-19: qty 2

## 2016-03-19 MED ORDER — IOPAMIDOL (ISOVUE-370) INJECTION 76%
INTRAVENOUS | Status: AC
  Filled 2016-03-19: qty 50

## 2016-03-19 MED ORDER — SODIUM CHLORIDE 0.9% FLUSH: 3.0000 mL | Freq: Two times a day (BID) | INTRAVENOUS | Status: DC

## 2016-03-19 MED ORDER — SODIUM CHLORIDE 0.9 % WEIGHT BASED INFUSION
3.0000 mL/kg/h | INTRAVENOUS | Status: DC
  Administered 2016-03-19: 3 mL/kg/h via INTRAVENOUS

## 2016-03-19 MED ORDER — ASPIRIN 81 MG PO CHEW: 81.0000 mg | CHEWABLE_TABLET | ORAL | Status: DC

## 2016-03-19 MED ORDER — IOPAMIDOL (ISOVUE-370) INJECTION 76%
INTRAVENOUS | Status: DC | PRN
  Administered 2016-03-19: 125 mL via INTRA_ARTERIAL

## 2016-03-19 MED ORDER — SODIUM CHLORIDE 0.9 % WEIGHT BASED INFUSION: 1.0000 mL/kg/h | INTRAVENOUS | Status: DC

## 2016-03-19 MED ORDER — HEPARIN SODIUM (PORCINE) 1000 UNIT/ML IJ SOLN
INTRAMUSCULAR | Status: AC
  Filled 2016-03-19: qty 1

## 2016-03-19 MED ORDER — ROSUVASTATIN CALCIUM 20 MG PO TABS
20.0000 mg | ORAL_TABLET | Freq: Every day | ORAL | Status: DC
  Administered 2016-03-19: 21:00:00 20 mg via ORAL
  Filled 2016-03-19: qty 1

## 2016-03-19 MED ORDER — ISOSORBIDE MONONITRATE ER 30 MG PO TB24
90.0000 mg | ORAL_TABLET | Freq: Every day | ORAL | Status: DC
  Administered 2016-03-20: 90 mg via ORAL
  Filled 2016-03-19: qty 1

## 2016-03-19 MED ORDER — CLOPIDOGREL BISULFATE 75 MG PO TABS: 75.0000 mg | ORAL_TABLET | Freq: Every day | ORAL | Status: DC

## 2016-03-19 MED ORDER — IOPAMIDOL (ISOVUE-370) INJECTION 76%
INTRAVENOUS | Status: AC
  Filled 2016-03-19: qty 125

## 2016-03-19 MED ORDER — HYDRALAZINE HCL 20 MG/ML IJ SOLN
5.0000 mg | INTRAMUSCULAR | Status: AC | PRN
  Administered 2016-03-19 (×2): 5 mg via INTRAVENOUS

## 2016-03-19 MED ORDER — HEPARIN SODIUM (PORCINE) 1000 UNIT/ML IJ SOLN
INTRAMUSCULAR | Status: DC | PRN
  Administered 2016-03-19: 5000 [IU] via INTRAVENOUS
  Administered 2016-03-19: 3000 [IU] via INTRAVENOUS
  Administered 2016-03-19: 4000 [IU] via INTRAVENOUS

## 2016-03-19 MED ORDER — PANTOPRAZOLE SODIUM 40 MG PO TBEC
40.0000 mg | DELAYED_RELEASE_TABLET | Freq: Every day | ORAL | Status: DC
  Administered 2016-03-20: 40 mg via ORAL
  Filled 2016-03-19: qty 1

## 2016-03-19 MED ORDER — SODIUM CHLORIDE 0.9% FLUSH: 3.0000 mL | INTRAVENOUS | Status: DC | PRN

## 2016-03-19 MED ORDER — SODIUM CHLORIDE 0.9 % IV SOLN: 250.0000 mL | INTRAVENOUS | Status: DC | PRN

## 2016-03-19 MED ORDER — ACETAMINOPHEN 325 MG PO TABS: 650.0000 mg | ORAL_TABLET | ORAL | Status: DC | PRN

## 2016-03-19 MED ORDER — ASPIRIN 81 MG PO CHEW
81.0000 mg | CHEWABLE_TABLET | Freq: Every day | ORAL | Status: DC
  Administered 2016-03-20: 08:00:00 81 mg via ORAL
  Filled 2016-03-19: qty 1

## 2016-03-19 MED ORDER — HEPARIN (PORCINE) IN NACL 2-0.9 UNIT/ML-% IJ SOLN
INTRAMUSCULAR | Status: DC | PRN
  Administered 2016-03-19: 1000 mL

## 2016-03-19 MED ORDER — VERAPAMIL HCL 2.5 MG/ML IV SOLN
INTRAVENOUS | Status: DC | PRN
  Administered 2016-03-19: 10 mL via INTRA_ARTERIAL

## 2016-03-19 MED ORDER — LABETALOL HCL 5 MG/ML IV SOLN: 10.0000 mg | INTRAVENOUS | Status: AC | PRN

## 2016-03-19 MED ORDER — MIDAZOLAM HCL 2 MG/2ML IJ SOLN
INTRAMUSCULAR | Status: DC | PRN
  Administered 2016-03-19: 2 mg via INTRAVENOUS

## 2016-03-19 MED ORDER — HYDRALAZINE HCL 20 MG/ML IJ SOLN
INTRAMUSCULAR | Status: AC
  Filled 2016-03-19: qty 1

## 2016-03-19 MED ORDER — ANGIOPLASTY BOOK
Freq: Once | Status: AC
  Administered 2016-03-19: 20:00:00
  Filled 2016-03-19: qty 1

## 2016-03-19 MED ORDER — LIDOCAINE HCL (PF) 1 % IJ SOLN
INTRAMUSCULAR | Status: AC
  Filled 2016-03-19: qty 30

## 2016-03-19 MED ORDER — FENTANYL CITRATE (PF) 100 MCG/2ML IJ SOLN
INTRAMUSCULAR | Status: AC
  Filled 2016-03-19: qty 2

## 2016-03-19 MED ORDER — FENTANYL CITRATE (PF) 100 MCG/2ML IJ SOLN
INTRAMUSCULAR | Status: DC | PRN
  Administered 2016-03-19: 25 ug via INTRAVENOUS

## 2016-03-19 MED ORDER — SODIUM CHLORIDE 0.9 % WEIGHT BASED INFUSION
1.0000 mL/kg/h | INTRAVENOUS | Status: AC
  Administered 2016-03-19: 15:00:00 1 mL/kg/h via INTRAVENOUS

## 2016-03-19 MED ORDER — LIDOCAINE HCL (PF) 1 % IJ SOLN
INTRAMUSCULAR | Status: DC | PRN
  Administered 2016-03-19: 2 mL

## 2016-03-19 MED ORDER — NITROGLYCERIN 1 MG/10 ML FOR IR/CATH LAB
INTRA_ARTERIAL | Status: AC
Start: 2016-03-19 — End: 2016-03-19
  Filled 2016-03-19: qty 10

## 2016-03-19 MED ORDER — METOPROLOL TARTRATE 25 MG PO TABS
50.0000 mg | ORAL_TABLET | Freq: Two times a day (BID) | ORAL | Status: DC
  Administered 2016-03-19 – 2016-03-20 (×2): 50 mg via ORAL
  Filled 2016-03-19 (×2): qty 2

## 2016-03-19 SURGICAL SUPPLY — 19 items
BALLN EUPHORA RX 2.0X12 (BALLOONS) ×2
BALLN ~~LOC~~ EUPHORA RX 3.5X12 (BALLOONS) ×2
BALLOON EUPHORA RX 2.0X12 (BALLOONS) IMPLANT
BALLOON ~~LOC~~ EUPHORA RX 3.5X12 (BALLOONS) IMPLANT
CATH INFINITI 5 FR AL2 (CATHETERS) ×1 IMPLANT
CATH INFINITI 5FR AL1 (CATHETERS) ×1 IMPLANT
CATH INFINITI 5FR MULTPACK ANG (CATHETERS) ×1 IMPLANT
DEVICE RAD COMP TR BAND LRG (VASCULAR PRODUCTS) ×1 IMPLANT
GLIDESHEATH SLEND SS 6F .021 (SHEATH) ×1 IMPLANT
GUIDE CATH RUNWAY 6FR AL 1 (CATHETERS) ×1 IMPLANT
GUIDEWIRE INQWIRE 1.5J.035X260 (WIRE) IMPLANT
INQWIRE 1.5J .035X260CM (WIRE) ×2
KIT ENCORE 26 ADVANTAGE (KITS) ×1 IMPLANT
KIT HEART LEFT (KITS) ×2 IMPLANT
PACK CARDIAC CATHETERIZATION (CUSTOM PROCEDURE TRAY) ×2 IMPLANT
STENT SYNERGY DES 2.25X16 (Permanent Stent) ×1 IMPLANT
TRANSDUCER W/STOPCOCK (MISCELLANEOUS) ×2 IMPLANT
TUBING CIL FLEX 10 FLL-RA (TUBING) ×2 IMPLANT
WIRE COUGAR XT STRL 190CM (WIRE) ×1 IMPLANT

## 2016-03-19 NOTE — Progress Notes (Signed)
Systolic BP elevated and sustaining in the 180's. Hydrlazine 5mg  IV administered per MD order.

## 2016-03-19 NOTE — Progress Notes (Signed)
TR BAND REMOVAL  LOCATION:    left radial  DEFLATED PER PROTOCOL:    Yes.    TIME BAND OFF / DRESSING APPLIED:    20:45   SITE UPON ARRIVAL:    Level 1 (Bruise)  SITE AFTER BAND REMOVAL:    Level 1 (Bruise)  CIRCULATION SENSATION AND MOVEMENT:    Within Normal Limits   Yes.    COMMENTS:   Post TR band instructions given. Pt tolerated well.

## 2016-03-19 NOTE — H&P (View-Only) (Signed)
Cardiology Office Note Date:  03/16/2016   ID:  [name], DOB [birth year], MRN [id number]  PCP:  Allean Found, MD  Cardiologist:  Sherren Mocha, MD    Chief Complaint  Patient presents with  . Chest Pain  . Shortness of Breath     History of Present Illness: [name] is a 71 y.o. male who presents for follow-up of CAD s/p CABG and PCI afterwards, HTN, HLD, CKD stage II and GERD who presented for follow up.   He has history of CAD S/P CABG 1993. He's undergone PCI of the vein graft obtuse marginal initially in 2009, again in 2012 and then most recently in 2014. He's also undergone multiple PCI procedures in the native RCA. His LIMA graft has remained widely patent. He's had episodic angina at follow-up.   The patient is here with his wife today. He reports progressive exercise intolerance and shortness of breath with physical activity. He had an episode a few days ago where he had marked shortness of breath with walking up a hill. He had to stop and rest. He took nitroglycerin and symptoms resolved. This is been his anginal equivalent in the past. Symptoms have slowly progressed over the last 4-6 weeks. He has had no resting symptoms. He is now using nitroglycerin about 3 days per week. He denies chest pain. He denies edema, orthopnea, or PND. Symptoms are primarily shortness of breath and marked fatigue.  Past Medical History:  Diagnosis Date  . Anemia   . Blood transfusion   . CAD (coronary artery disease)    a. CABG 1993;  b. PCI of SVG to OM Jan 2012 (Drug-eluting stent x 2);  c. 09/2012 Cath/PCI: LM nl, LAD sev dzs, LCX 100, RCA patent stent, VG->AM 100, LIMA->LAD nl, VG->OM 95p ISR (PTCA only), patent distal stent.  . CKD (chronic kidney disease), stage III   . Complication of anesthesia   . Degeneration of lumbar or lumbosacral intervertebral disc   . GERD (gastroesophageal reflux disease)   . H/O hiatal hernia   . Headache(784.0)   . History of nephrolithiasis   .  Hyperlipidemia   . Hyperplastic colon polyp   . Hypertension   . Myocardial infarction    hx of five   . PONV (postoperative nausea and vomiting)   . Stricture and stenosis of esophagus     Past Surgical History:  Procedure Laterality Date  . ANGIOPLASTY    . BACK SURGERY    . CARDIAC CATHETERIZATION    . COLONOSCOPY    . CORONARY ARTERY BYPASS GRAFT  1993  . HOLMIUM LASER APPLICATION Right 04/09/8117   Procedure: HOLMIUM LASER APPLICATION;  Surgeon: Jorja Loa, MD;  Location: [facility];  Service: Urology;  Laterality: Right;  . LEFT HEART CATHETERIZATION WITH CORONARY/GRAFT ANGIOGRAM N/A 09/23/2012   Procedure: LEFT HEART CATHETERIZATION WITH Beatrix Fetters;  Surgeon: Peter M Martinique, MD;  Location: [facility] CATH LAB;  Service: Cardiovascular;  Laterality: N/A;  . LITHOTRIPSY    . Lumbar back    . NEPHROLITHOTOMY Right 09/17/2013   Procedure: NEPHROLITHOTOMY PERCUTANEOUS;  Surgeon: Jorja Loa, MD;  Location: [facility];  Service: Urology;  Laterality: Right;  . PERCUTANEOUS CORONARY INTERVENTION-BALLOON ONLY  09/23/2012   Procedure: PERCUTANEOUS CORONARY INTERVENTION-BALLOON ONLY;  Surgeon: Peter M Martinique, MD;  Location: [facility] CATH LAB;  Service: Cardiovascular;;  OM vein graft    Current Outpatient Prescriptions  Medication Sig Dispense Refill  . amLODipine (NORVASC) 5 MG tablet Take 1 tablet (5 mg  total) by mouth every morning. 90 tablet 3  . aspirin 325 MG tablet Take 325 mg by mouth daily.    . clopidogrel (PLAVIX) 75 MG tablet Take 1 tablet (75 mg total) by mouth at bedtime. 90 tablet 3  . ferrous sulfate 325 (65 FE) MG tablet Take 325 mg by mouth daily with breakfast.    . isosorbide mononitrate (IMDUR) 60 MG 24 hr tablet Take 1.5 tablets (90 mg total) by mouth daily. 135 tablet 3  . metoprolol (LOPRESSOR) 50 MG tablet Take 1 tablet (50 mg total) by mouth 2 (two) times daily. 180 tablet 3  . nitroGLYCERIN (NITROSTAT) 0.4 MG SL tablet Place 1 tablet (0.4 mg total)  under the tongue every 5 (five) minutes as needed for chest pain. Up to 3 doses 25 tablet 1  . pantoprazole (PROTONIX) 40 MG tablet TAKE ONE (1) TABLET EACH DAY 90 tablet 3  . rosuvastatin (CRESTOR) 20 MG tablet Take 1 tablet (20 mg total) by mouth daily. 90 tablet 3   Current Facility-Administered Medications  Medication Dose Route Frequency Provider Last Rate Last Dose  . triamcinolone acetonide (KENALOG) 10 MG/ML injection 10 mg  10 mg Other Once Harriet Masson, DPM      . triamcinolone acetonide (KENALOG) 10 MG/ML injection 10 mg  10 mg Other Once Harriet Masson, DPM        Allergies:   Patient has no known allergies.   Social History:  The patient  reports that he quit smoking about 29 years ago. He has quit using smokeless tobacco. He reports that he does not drink alcohol or use drugs.   Family History:  The patient's  family history includes CAD (age of onset: 65) in his other and other; Coronary artery disease (age of onset: 16) in his mother; Kidney cancer in his mother; Lung cancer in his brother and sister.    ROS:  Please see the history of present illness.  Otherwise, review of systems is positive for chills, back pain, muscle pain, dizziness, leg pain, balance problems, excessive fatigue, excessive sweating.  All other systems are reviewed and negative.    PHYSICAL EXAM: VS:  BP 130/68   Pulse (!) 56   Ht 5\' 6"  (1.676 m)   Wt 186 lb 3.2 oz (84.5 kg)   BMI 30.05 kg/m  , BMI Body mass index is 30.05 kg/m. GEN: Well nourished, well developed, in no acute distress  HEENT: normal  Neck: no JVD, no masses. No carotid bruits Cardiac: RRR without murmur or gallop                Respiratory:  clear to auscultation bilaterally, normal work of breathing GI: soft, nontender, nondistended, + BS MS: no deformity or atrophy  Ext: no pretibial edema, pedal pulses 2+= bilaterally Skin: warm and dry, no rash Neuro:  Strength and sensation are intact Psych: euthymic mood, full  affect  EKG:  EKG is ordered today. The ekg ordered today shows sinus bradycardia 57 bpm, nonspecific ST and T wave abnormality  Recent Labs: No results found for requested labs within last 8760 hours.   Lipid Panel     Component Value Date/Time   CHOL 115 (L) 01/11/2015 0820   TRIG 119 01/11/2015 0820   HDL 26 (L) 01/11/2015 0820   CHOLHDL 4.4 01/11/2015 0820   VLDL 24 01/11/2015 0820   LDLCALC 65 01/11/2015 0820   LDLDIRECT 122.5 12/02/2013 0922      Wt Readings from Last 3 Encounters:  03/16/16 186 lb 3.2 oz (84.5 kg)  12/30/15 185 lb (83.9 kg)  12/13/14 173 lb (78.5 kg)     ASSESSMENT AND PLAN: 1.  CAD, native vessel and bypass graft disease, with CCS class III angina: The patient has progressive anginal symptoms with exertion despite maximal medical therapy on isosorbide, metoprolol, and amlodipine. He has undergone multiple pain graft interventions in the past and has extensive coronary disease. He is taking nitroglycerin more frequently and is increasingly limited. I have recommend cardiac catheterization and possible PCI. I have reviewed the risks, indications, and alternatives to cardiac catheterization, possible angioplasty, and stenting with the patient. Risks include but are not limited to bleeding, infection, vascular injury, stroke, myocardial infection, arrhythmia, kidney injury, radiation-related injury in the case of prolonged fluoroscopy use, emergency cardiac surgery, and death. The patient understands the risks of serious complication is 1-2 in 6808 with diagnostic cardiac cath and 1-2% or less with angioplasty/stenting.   2. HTN: BP controlled on current Rx.   3. Hyperlipidemia: treated with crestor  Current medicines are reviewed with the patient today.  The patient does not have concerns regarding medicines.  Labs/ tests ordered today include:  No orders of the defined types were placed in this encounter.   Disposition:   FU pending cardiac cath  results  Signed, Sherren Mocha, MD  03/16/2016 12:16 PM    [facility] [place], [place], [place]  [address] Phone: [phone]; Fax: [phone]

## 2016-03-19 NOTE — Progress Notes (Signed)
BP remained elevated post hydralazine. Another 5 mg Hydralazine IV administered. BP now 175/68.

## 2016-03-19 NOTE — Interval H&P Note (Signed)
Cath Lab Visit (complete for each Cath Lab visit)  Clinical Evaluation Leading to the Procedure:   ACS: No.  Non-ACS:    Anginal Classification: CCS III  Anti-ischemic medical therapy: Maximal Therapy (2 or more classes of medications)  Non-Invasive Test Results: No non-invasive testing performed  Prior CABG: Previous CABG      History and Physical Interval Note:  03/19/2016 12:01 PM  [name]  has presented today for surgery, with the diagnosis of cad w/ angina  The various methods of treatment have been discussed with the patient and family. After consideration of risks, benefits and other options for treatment, the patient has consented to  Procedure(s): Left Heart Cath and Cors/Grafts Angiography (N/A) as a surgical intervention .  The patient's history has been reviewed, patient examined, no change in status, stable for surgery.  I have reviewed the patient's chart and labs.  Questions were answered to the patient's satisfaction.     Sherren Mocha

## 2016-03-20 ENCOUNTER — Ambulatory Visit (HOSPITAL_BASED_OUTPATIENT_CLINIC_OR_DEPARTMENT_OTHER): Payer: Medicare Other

## 2016-03-20 DIAGNOSIS — K449 Diaphragmatic hernia without obstruction or gangrene: Secondary | ICD-10-CM | POA: Diagnosis not present

## 2016-03-20 DIAGNOSIS — Z7982 Long term (current) use of aspirin: Secondary | ICD-10-CM | POA: Diagnosis not present

## 2016-03-20 DIAGNOSIS — I2582 Chronic total occlusion of coronary artery: Secondary | ICD-10-CM | POA: Diagnosis not present

## 2016-03-20 DIAGNOSIS — E785 Hyperlipidemia, unspecified: Secondary | ICD-10-CM | POA: Diagnosis not present

## 2016-03-20 DIAGNOSIS — R072 Precordial pain: Secondary | ICD-10-CM | POA: Diagnosis not present

## 2016-03-20 DIAGNOSIS — D631 Anemia in chronic kidney disease: Secondary | ICD-10-CM | POA: Diagnosis not present

## 2016-03-20 DIAGNOSIS — K219 Gastro-esophageal reflux disease without esophagitis: Secondary | ICD-10-CM | POA: Diagnosis not present

## 2016-03-20 DIAGNOSIS — I200 Unstable angina: Secondary | ICD-10-CM | POA: Diagnosis not present

## 2016-03-20 DIAGNOSIS — I252 Old myocardial infarction: Secondary | ICD-10-CM | POA: Diagnosis not present

## 2016-03-20 DIAGNOSIS — T82855A Stenosis of coronary artery stent, initial encounter: Secondary | ICD-10-CM | POA: Diagnosis not present

## 2016-03-20 DIAGNOSIS — N182 Chronic kidney disease, stage 2 (mild): Secondary | ICD-10-CM | POA: Diagnosis not present

## 2016-03-20 DIAGNOSIS — I129 Hypertensive chronic kidney disease with stage 1 through stage 4 chronic kidney disease, or unspecified chronic kidney disease: Secondary | ICD-10-CM | POA: Diagnosis not present

## 2016-03-20 DIAGNOSIS — Z7902 Long term (current) use of antithrombotics/antiplatelets: Secondary | ICD-10-CM | POA: Diagnosis not present

## 2016-03-20 DIAGNOSIS — I25710 Atherosclerosis of autologous vein coronary artery bypass graft(s) with unstable angina pectoris: Secondary | ICD-10-CM | POA: Diagnosis not present

## 2016-03-20 DIAGNOSIS — Z9889 Other specified postprocedural states: Secondary | ICD-10-CM

## 2016-03-20 LAB — BASIC METABOLIC PANEL
ANION GAP: 9 (ref 5–15)
BUN: 14 mg/dL (ref 6–20)
CHLORIDE: 107 mmol/L (ref 101–111)
CO2: 22 mmol/L (ref 22–32)
Calcium: 8.5 mg/dL — ABNORMAL LOW (ref 8.9–10.3)
Creatinine, Ser: 1.46 mg/dL — ABNORMAL HIGH (ref 0.61–1.24)
GFR calc Af Amer: 55 mL/min — ABNORMAL LOW (ref >60)
GFR, EST NON AFRICAN AMERICAN: 47 mL/min — AB (ref >60)
Glucose, Bld: 190 mg/dL — ABNORMAL HIGH (ref 65–99)
POTASSIUM: 3.8 mmol/L (ref 3.5–5.1)
SODIUM: 138 mmol/L (ref 135–145)

## 2016-03-20 LAB — ECHOCARDIOGRAM COMPLETE
HEIGHTINCHES: 66 in
Weight: 3005.31 oz

## 2016-03-20 LAB — CBC
HEMATOCRIT: 32.4 % — AB (ref 39.0–52.0)
HEMOGLOBIN: 10.5 g/dL — AB (ref 13.0–17.0)
MCH: 26.5 pg (ref 26.0–34.0)
MCHC: 32.4 g/dL (ref 30.0–36.0)
MCV: 81.8 fL (ref 78.0–100.0)
Platelets: 155 10*3/uL (ref 150–400)
RBC: 3.96 MIL/uL — ABNORMAL LOW (ref 4.22–5.81)
RDW: 14.3 % (ref 11.5–15.5)
WBC: 6.3 10*3/uL (ref 4.0–10.5)

## 2016-03-20 LAB — POCT ACTIVATED CLOTTING TIME
ACTIVATED CLOTTING TIME: 246 s
ACTIVATED CLOTTING TIME: 274 s

## 2016-03-20 MED ORDER — NITROGLYCERIN 0.4 MG/SPRAY TL SOLN
1.0000 | 12 refills | Status: DC | PRN
Start: 2016-03-20 — End: 2016-03-22

## 2016-03-20 MED ORDER — ASPIRIN 81 MG PO TABS: 81.0000 mg | ORAL_TABLET | Freq: Every day | ORAL | 11 refills | Status: DC

## 2016-03-20 NOTE — Progress Notes (Signed)
  Echocardiogram 2D Echocardiogram has been performed.  Jennette Dubin 03/20/2016, 11:49 AM

## 2016-03-20 NOTE — Discharge Instructions (Signed)
No driving for 5 days. No lifting over 5 lbs for 1 week. No sexual activity for 1 week. You may return to work on 5-7 days. Keep procedure site clean & dry. If you notice increased pain, swelling, bleeding or pus, call/return!  You may shower, but no soaking baths/hot tubs/pools for 1 week.

## 2016-03-20 NOTE — Care Management Note (Signed)
Case Management Note  Patient Details  Name: [name] MRN: [id number] Date of Birth: [birth year]  Subjective/Objective:    s/p coronary stent intervention, will be on plavix, ambulated with Cardiac Rehab, for dc today.                Action/Plan:   Expected Discharge Date:                  Expected Discharge Plan:  Home/Self Care  In-House Referral:     Discharge planning Services  CM Consult  Post Acute Care Choice:    Choice offered to:     DME Arranged:    DME Agency:     HH Arranged:    HH Agency:     Status of Service:  Completed, signed off  If discussed at [employer] of Stay Meetings, dates discussed:    Additional Comments:  Zenon Mayo, RN 03/20/2016, 8:50 AM

## 2016-03-20 NOTE — Discharge Summary (Signed)
Discharge Summary    Patient ID: [name],  MRN: [id number], DOB/AGE: 1945/02/26 71 y.o.  Admit date: 03/19/2016 Discharge date: 03/20/2016  Primary Care Provider: [facility] Primary Cardiologist: Dr. Burt Knack   Discharge Diagnoses    Principal Problem:   Status post left heart catheterization (LHC): (03/2016) a.  s/p balloon angioplasty of the stented segment in the body of sVG to OM and b. stenting of native OM branch Active Problems:   Accelerating angina [facility])   Coronary artery disease with exertional angina (HCC)   Allergies No Known Allergies   History of Present Illness     [name] 72 y.o. male has PMH of CAD s/p CABG 1993; He's undergone PCI of the vein graft obtuse marginal initially in 2009, again in 2012 and then most recently in 2014. He's also undergone multiple PCI procedures in the native RCA. His LIMA graft has remained widely patent and PCI, HTN, HLD, CKD stage II and GERD. Burns presented as an outpatient patient of Dr. York Cerise for cardiac catheterization due to progressive anginal symptoms requiring the use of nitro up to 3 times per week. He is on maximized doses of isosorbide, metoprolol, and amlodipine and continuing to become increasingly limited.   Hospital Course     Consultants: None  He had left heart cath done on 03/19/16 which revealed severe native vessel coronary artery disease with near occlusion of the entire left coronary artery distribution and received balloon angioplasty and stenting, it is recommended that he continue antiplatelet therapy of aspirin and clopidogrel.  He had a cardiac echo performed which showed mild LVH, normal systolic function, EF 32-67%, normal wall motion, grade 2 diastolic dysfunction.  Blood pressures were elevated night after cath requiring IV Hydralazine (10 mg and 5 mg) but responded well to medications, BP well controlled at home, pt to monitor closely at home. He is doing well this morning up and walking  with marked improvement in his DOE and exertional angina. No complaints or questions at this time.  The patient has had an uncomplicated hospital course and is recovering well. The left radial catheter site is stable He has been seen by Dr. Angelena Form today and deemed ready for discharge home. All follow-up appointments have been scheduled. Smoking cessation was disscussed in length. Discharge medications are listed below.  _____________  Discharge Vitals Blood pressure (!) 147/79, pulse 72, temperature 98.2 F (36.8 C), temperature source Oral, resp. rate 14, height 5\' 6"  (1.676 m), weight 187 lb 13.3 oz (85.2 kg), SpO2 95 %.  Filed Weights   03/19/16 1021 03/20/16 0600  Weight: 186 lb (84.4 kg) 187 lb 13.3 oz (85.2 kg)    Labs & Radiologic Studies     CBC  Recent Labs  03/20/16 0214  WBC 6.3  HGB 10.5*  HCT 32.4*  MCV 81.8  PLT 124   Basic Metabolic Panel  Recent Labs  03/20/16 0214  NA 138  K 3.8  CL 107  CO2 22  GLUCOSE 190*  BUN 14  CREATININE 1.46*  CALCIUM 8.5*    Diagnostic Studies/Procedures    Transthoracic Echocardiography 03/20/2016 Study Conclusions  - Left ventricle: The cavity size was normal. Wall thickness was   increased in a pattern of mild LVH. Systolic function was normal.   The estimated ejection fraction was in the range of 60% to 65%.   Wall motion was normal; there were no regional wall motion   abnormalities. Features are consistent with a pseudonormal left  ventricular filling pattern, with concomitant abnormal relaxation   and increased filling pressure (grade 2 diastolic dysfunction).   Left Heart Cath and Cors/Grafts Angiography 03/19/2016  1. Severe native vessel coronary artery disease with near occlusion of the entire left coronary artery distribution 2. Continued patency of the stented segments in the right coronary artery with mild in-stent restenosis 3. Status post CABG with continued patency of the LIMA to LAD,  chronic occlusion of the saphenous vein graft to acute marginal branch of the RCA, and severe stenosis in the proximal body of the graft to the obtuse marginal as well as severe OM subbranch stenosis, treated successfully with balloon angioplasty in the body of the vein graft and stenting in the native vessel beyond the vein graft.  Recommend:  Continue dual antiplatelet therapy with aspirin and clopidogrel  Check echocardiogram to assess LV function. Ventriculography not performed because of chronic kidney disease _____________   Disposition   Pt is being discharged home today in good condition.  Follow-up Plans & Appointments    Follow-up Information    Bhagat,Bhavinkumar, PA Follow up on 04/05/2016.   Specialty:  Cardiology Why:  Appointment is at 8:00am, please arrive 10 minutes early for appointment Contact information: [place] [address] [phone]          Discharge Instructions    Amb Referral to Cardiac Rehabilitation    Complete by:  As directed    Diagnosis:   Coronary Stents PTCA        Discharge Medications   Allergies as of 03/20/2016   No Known Allergies     Medication List    TAKE these medications   amLODipine 5 MG tablet Commonly known as:  NORVASC Take 1 tablet (5 mg total) by mouth every morning.   aspirin 81 MG tablet Take 1 tablet (81 mg total) by mouth daily. What changed:  medication strength  how much to take   clopidogrel 75 MG tablet Commonly known as:  PLAVIX Take 1 tablet (75 mg total) by mouth at bedtime.   ferrous sulfate 325 (65 FE) MG tablet Take 325 mg by mouth daily with breakfast.   isosorbide mononitrate 60 MG 24 hr tablet Commonly known as:  IMDUR Take 1.5 tablets (90 mg total) by mouth daily.   metoprolol 50 MG tablet Commonly known as:  LOPRESSOR Take 1 tablet (50 mg total) by mouth 2 (two) times daily.   nitroGLYCERIN 0.4 MG SL tablet Commonly known as:  NITROSTAT Place 1  tablet (0.4 mg total) under the tongue every 5 (five) minutes as needed for chest pain. Up to 3 doses What changed:  Another medication with the same name was added. Make sure you understand how and when to take each.   nitroGLYCERIN 0.4 MG/SPRAY spray Commonly known as:  NITROLINGUAL Place 1 spray under the tongue every 5 (five) minutes x 3 doses as needed for chest pain. What changed:  You were already taking a medication with the same name, and this prescription was added. Make sure you understand how and when to take each.   pantoprazole 40 MG tablet Commonly known as:  PROTONIX TAKE ONE (1) TABLET EACH DAY   rosuvastatin 20 MG tablet Commonly known as:  CRESTOR Take 1 tablet (20 mg total) by mouth daily.       Aspirin prescribed at discharge?  Yes High Intensity Statin Prescribed? (Lipitor 40-80mg  or Crestor 20-40mg ): Yes Beta Blocker Prescribed? Yes For EF 45% or less, Was  ACEI/ARB Prescribed? No: EF is WNL ADP Receptor Inhibitor Prescribed? (i.e. Plavix etc.-Includes Medically Managed Patients): Yes For EF <45%, Aldosterone Inhibitor Prescribed? No: EF is WNL Was EF assessed during THIS hospitalization? Yes Was Cardiac Rehab II ordered? (Included Medically managed Patients): Yes   Outstanding Labs/Studies   Follow-up scheduled for in 2 weeks.  Duration of Discharge Encounter   Greater than 30 minutes including physician time.  Kristopher Glee PA-C 03/20/2016, 10:11 AM   See my full note this am. cdm

## 2016-03-20 NOTE — Progress Notes (Signed)
Progress Note  Patient Name: [name] Date of Encounter: 03/20/2016  Primary Cardiologist: Dr. Burt Knack  Subjective   Blood pressures were elevated last night requiring IV Hydralazine (10 mg and 5 mg), this morning pressures are improved. He had an outpatient left heart cath done yesterday 1. Severe native vessel coronary artery disease with near occlusion of the entire left coronary artery distribution and received balloon angioplasty and stenting. He is scheduled for an Echo today to evaluate left ventricular function.  Patient is doing well this morning. No chest pain, reports that he has been up and walking with marked improvement in his DOE and exertional angina. No complaints or questions at this time.  Inpatient Medications    Scheduled Meds: . amLODipine  5 mg Oral q morning - 10a  . angioplasty book   Does not apply Once  . aspirin  81 mg Oral Daily  . clopidogrel  75 mg Oral QHS  . ferrous sulfate  325 mg Oral Q breakfast  . isosorbide mononitrate  90 mg Oral Daily  . metoprolol  50 mg Oral BID  . pantoprazole  40 mg Oral Daily  . rosuvastatin  20 mg Oral q1800  . sodium chloride flush  3 mL Intravenous Q12H   Continuous Infusions:  PRN Meds: sodium chloride, acetaminophen, nitroGLYCERIN, ondansetron (ZOFRAN) IV, sodium chloride flush   Vital Signs    Vitals:   03/19/16 1800 03/19/16 2000 03/19/16 2100 03/20/16 0600  BP: (!) 141/52 (!) 157/57 (!) 151/102 (!) 102/55  Pulse: 69 69 74 63  Resp: 13 (!) 21 19 14   Temp:    98.3 F (36.8 C)  TempSrc:    Oral  SpO2: 98% 95% 98% 96%  Weight:    187 lb 13.3 oz (85.2 kg)  Height:        Intake/Output Summary (Last 24 hours) at 03/20/16 0655 Last data filed at 03/20/16 0000  Gross per 24 hour  Intake           1098.5 ml  Output             1250 ml  Net           -151.5 ml   Filed Weights   03/19/16 1021 03/20/16 0600  Weight: 186 lb (84.4 kg) 187 lb 13.3 oz (85.2 kg)    Telemetry    NSR, HR 55-60's -  Personally Reviewed  ECG    NSR, HR 61 - Personally Reviewed  Physical Exam   GEN: No acute distress.   Neck: No JVD Cardiac: RRR, no murmurs, rubs, or gallops.  Respiratory: Clear to auscultation bilaterally. GI: Soft, nontender, non-distended  MS: No edema; No deformity. Left radial cath site shows no hematoma or ecchymosis. Non tender, FROM of all 5 fingers with intact sensations. Neuro:  Nonfocal  Psych: Normal affect   Labs    Chemistry Recent Labs Lab 03/16/16 1301 03/20/16 0214  NA 135 138  K 4.9 3.8  CL 101 107  CO2 26 22  GLUCOSE 205* 190*  BUN 21 14  CREATININE 1.64* 1.46*  CALCIUM 8.9 8.5*  GFRNONAA 42* 47*  GFRAA 49* 55*  ANIONGAP  --  9     Hematology Recent Labs Lab 03/16/16 1301 03/20/16 0214  WBC 6.2 6.3  RBC 4.45 3.96*  HGB  --  10.5*  HCT 35.7* 32.4*  MCV 80 81.8  MCH 27.2 26.5  MCHC 33.9 32.4  RDW 15.4 14.3  PLT 204 155  Radiology    No results found.  Cardiac Studies   Left Heart Cath and Cors/Grafts Angiography 03/19/2016  1. Severe native vessel coronary artery disease with near occlusion of the entire left coronary artery distribution 2. Continued patency of the stented segments in the right coronary artery with mild in-stent restenosis 3. Status post CABG with continued patency of the LIMA to LAD, chronic occlusion of the saphenous vein graft to acute marginal branch of the RCA, and severe stenosis in the proximal body of the graft to the obtuse marginal as well as severe OM subbranch stenosis, treated successfully with balloon angioplasty in the body of the vein graft and stenting in the native vessel beyond the vein graft.  Recommend:  Continue dual antiplatelet therapy with aspirin and clopidogrel  Check echocardiogram to assess LV function. Ventriculography not performed because of chronic kidney disease  Patient Profile     [name] 71 y.o. male has PMH of CAD s/p CABG 1993; He's undergone PCI of the vein  graft obtuse marginal initially in 2009, again in 2012 and then most recently in 2014. He's also undergone multiple PCI procedures in the native RCA. His LIMA graft has remained widely patent and PCI, HTN, HLD, CKD stage II and GERD. [name] presented as an outpatient patient of Dr. York Cerise for cardiac catheterization due to progressive anginal symptoms requiring the use of nitro up to 3 times per week. He is on maximized doses of isosorbide, metoprolol, and amlodipine and continuing to become increasingly limited.   Assessment & Plan     1.  CAD, native vessel and bypass graft disease, with CCS class III angina: Cardiac cath performed and he had severe stenosis in the proximal body of the graft to the obtuse marginal as well as severe OM subbranch stenosis, treated successfully with balloon angioplasty in the body of the vein graft and stenting in the native vessel beyond the vein graft. Recommendations are for him to continue dual antiplatelet therapy with aspirin and clopidogrel. Unable to evaluate LV function due to kidney disease therefore he is scheduled for echo today.  2. HTN: BP controlled on current Rx. Blood pressures ran high last night requiring IV Hydralazine but patient has been well controlled at home. Will monitor.  3. Hyperlipidemia: On crestor  Anticipated discharge pending cardiac rehab and transthoracic echo to eval LV function.   Kristopher Glee, PA-C  03/20/2016, 6:55 AM    I have personally seen and examined this patient with Douglas Haring, PA-C. I agree with the assessment and plan as outlined above. He was admitted following cath/PCI with recent unstable angina. He is now s/p balloon angioplasty of the stented segment in the body of sVG to OM and stenting of native OM branch. He is doing well this am without chest pain. Will continue ASA and Plavix. Echo today to assess LVEF. Discharge home after echo. Follow up Burt Knack or office APP in 2 weeks.   Lauree Chandler 03/20/2016 7:32 AM

## 2016-03-20 NOTE — Progress Notes (Signed)
Patient ambulates 550 feet with cardiac rehab and has some chest pain rating 5/10, BP increased to 222/87. Chest pain resolved with rest, morning BP medications given. With rest prior to given medications, BP=175/77 and chest pain has resolved completely.

## 2016-03-20 NOTE — Progress Notes (Addendum)
CARDIAC REHAB PHASE I   PRE:  Rate/Rhythm: 71 SR    BP: sitting 165/69    SaO2:   MODE:  Ambulation: 550 ft   POST:  Rate/Rhythm: 87 SR    BP: sitting 215/88, other arm 222/87, with manual and rest 166/64     SaO2: 97 RA  Pt with elevated BP this am. Walked up 30 degree hill and after 400 ft began having chest tightness, his typical angina. It moved to under his bilateral arms. Some relief with rest and deep breaths. Complete relief with lying down for 5 minutes. BP significantly elevated during episode (see above). RN and Dr. Burt Knack aware. Ed completed with pt, reinforcing Plavix/ASA, diet, ex, NTG (he would like rx for NTG spray), and CRPII. Will refer to [facility]) CRPII. Pt receptive. He got meds and plans to walk again to test angina. [place], ACSM 03/20/2016 9:21 AM

## 2016-03-22 ENCOUNTER — Telehealth: Payer: Self-pay

## 2016-03-22 ENCOUNTER — Other Ambulatory Visit: Payer: Self-pay

## 2016-03-22 MED ORDER — NITROGLYCERIN 0.4 MG SL SUBL: 0.4000 mg | SUBLINGUAL_TABLET | SUBLINGUAL | 1 refills | Status: DC | PRN

## 2016-03-22 NOTE — Telephone Encounter (Signed)
The pts wife is advised that the pts insurance will not cover NTG in the spray form. She states that the pt has no problems taking the pill form but that the spray form will last longer as the pills "go bad in 30 days after bottle is open".  She is advised to check online for discounts on spray form of NTG or to call us back if the pt wants to apply for pt assistance to get spray if available.  She verbalized understanding and asked that I send in a refill on the pts pill form of NTG.  Dr Burt Knack is aware.

## 2016-03-23 ENCOUNTER — Encounter: Payer: Self-pay | Admitting: Physician Assistant

## 2016-03-26 ENCOUNTER — Other Ambulatory Visit: Payer: Self-pay | Admitting: Cardiovascular Disease

## 2016-03-26 MED ORDER — PANTOPRAZOLE SODIUM 40 MG PO TBEC: DELAYED_RELEASE_TABLET | ORAL | 3 refills | Status: DC

## 2016-04-02 NOTE — Progress Notes (Deleted)
Cardiology Office Note    Date:  04/02/2016   ID:  [name], DOB [birth year], MRN [id number]  PCP:  Allean Found, MD  Cardiologist:  Dr. Burt Knack  Chief Complaint: Hospital follow up s/p cath   History of Present Illness:   [name] is a 71 y.o. male CAD s/p CABG 1993 and multiple PCI after wards, HTN, HLD, CKD stage II and GERD presents for hospital follow up.   He's undergone PCI of the vein graft obtuse marginal initially in 2009, again in 2012 and then most recently in 2014. He's also undergone multiple PCI procedures in the native RCA.   Seen by Dr. Burt Knack recently for progressive angina requiring nitro up to 3 times/week. He had left heart cath done on 03/19/16 that showed continued patency of the LIMA to LAD, chronic occlusion of the saphenous vein graft to acute marginal branch of the RCA, and severe stenosis in the proximal body of the graft to the obtuse marginal as well as severe OM subbranch stenosis s/p balloon angioplasty of the stented segment in the body of sVG to OM and stenting of native OM branch (see cath for detailed report).  recommended that he continue antiplatelet therapy of aspirin and clopidogrel.  He had a cardiac echo performed which showed mild LVH, normal systolic function, EF 89-38%, normal wall motion, grade 2 diastolic dysfunction.   Past Medical History:  Diagnosis Date  . Anemia   . Blood transfusion   . CAD (coronary artery disease)    a. CABG 1993;  b. PCI of SVG to OM Jan 2012 (Drug-eluting stent x 2);  c. 09/2012 Cath/PCI: LM nl, LAD sev dzs, LCX 100, RCA patent stent, VG->AM 100, LIMA->LAD nl, VG->OM 95p ISR (PTCA only), patent distal stent.  . CKD (chronic kidney disease), stage III   . Complication of anesthesia   . Degeneration of lumbar or lumbosacral intervertebral disc   . Dyspnea   . GERD (gastroesophageal reflux disease)   . H/O hiatal hernia   . Headache(784.0)   . History of kidney stones   . History of nephrolithiasis   .  Hyperlipidemia   . Hyperplastic colon polyp   . Hypertension   . Myocardial infarction    hx of five   . PONV (postoperative nausea and vomiting)   . Stricture and stenosis of esophagus     Past Surgical History:  Procedure Laterality Date  . ANGIOPLASTY    . BACK SURGERY    . CARDIAC CATHETERIZATION    . COLONOSCOPY    . CORONARY ARTERY BYPASS GRAFT  1993  . CORONARY STENT INTERVENTION N/A 03/19/2016   Procedure: Coronary Stent Intervention;  Surgeon: Sherren Mocha, MD;  Location: [place] [facility];  Service: Cardiovascular;  Laterality: N/A;  . HOLMIUM LASER APPLICATION Right 01/02/7508   Procedure: HOLMIUM LASER APPLICATION;  Surgeon: Jorja Loa, MD;  Location: [facility];  Service: Urology;  Laterality: Right;  . LEFT HEART CATH AND CORS/GRAFTS ANGIOGRAPHY N/A 03/19/2016   Procedure: Left Heart Cath and Cors/Grafts Angiography;  Surgeon: Sherren Mocha, MD;  Location: [place] [facility];  Service: Cardiovascular;  Laterality: N/A;  . LEFT HEART CATHETERIZATION WITH CORONARY/GRAFT ANGIOGRAM N/A 09/23/2012   Procedure: LEFT HEART CATHETERIZATION WITH Beatrix Fetters;  Surgeon: Peter M Martinique, MD;  Location: [facility] C[facility] LAB;  Service: Cardiovascular;  Laterality: N/A;  . LITHOTRIPSY    . Lumbar back    . NEPHROLITHOTOMY Right 09/17/2013   Procedure: NEPHROLITHOTOMY PERCUTANEOUS;  Surgeon: Lillette Boxer Dahlstedt,  MD;  Location: [facility];  Service: Urology;  Laterality: Right;  . PERCUTANEOUS CORONARY INTERVENTION-BALLOON ONLY  09/23/2012   Procedure: PERCUTANEOUS CORONARY INTERVENTION-BALLOON ONLY;  Surgeon: Peter M Martinique, MD;  Location: [facility] CATH LAB;  Service: Cardiovascular;;  OM vein graft    Current Medications: Prior to Admission medications   Medication Sig Start Date End Date Taking? Authorizing Provider  amLODipine (NORVASC) 5 MG tablet Take 1 tablet (5 mg total) by mouth every morning. 12/30/15   Leanor Kail, PA  aspirin 81 MG tablet Take 1 tablet (81 mg total) by  mouth daily. 03/20/16   Delos Haring, PA-C  clopidogrel (PLAVIX) 75 MG tablet Take 1 tablet (75 mg total) by mouth at bedtime. 12/30/15   Arriana Lohmann, PA  ferrous sulfate 325 (65 FE) MG tablet Take 325 mg by mouth daily with breakfast.    Historical Provider, MD  isosorbide mononitrate (IMDUR) 60 MG 24 hr tablet Take 1.5 tablets (90 mg total) by mouth daily. 12/30/15 03/29/16  Sevannah Madia, PA  metoprolol (LOPRESSOR) 50 MG tablet Take 1 tablet (50 mg total) by mouth 2 (two) times daily. 12/30/15   Donnovan Stamour, PA  nitroGLYCERIN (NITROSTAT) 0.4 MG SL tablet Place 1 tablet (0.4 mg total) under the tongue every 5 (five) minutes as needed for chest pain. Up to 3 doses 03/22/16   Sherren Mocha, MD  pantoprazole (PROTONIX) 40 MG tablet TAKE ONE (1) TABLET EACH DAY 03/26/16   Sherren Mocha, MD  rosuvastatin (CRESTOR) 20 MG tablet Take 1 tablet (20 mg total) by mouth daily. 12/30/15   Leanor Kail, PA    Allergies:   Patient has no known allergies.   Social History   Social History  . Marital status: Married    Spouse name: N/A  . Number of children: 2  . Years of education: N/A   Occupational History  . Retired Scientist, research (life sciences)   Social History Main Topics  . Smoking status: Former Smoker    Quit date: 01/06/1987  . Smokeless tobacco: Former Systems developer  . Alcohol use No  . Drug use: No  . Sexual activity: Not on file   Other Topics Concern  . Not on file   Social History Narrative  . No narrative on file     Family History:  The patient's family history includes CAD (age of onset: 37) in his other and other; Coronary artery disease (age of onset: 77) in his mother; Kidney cancer in his mother; Lung cancer in his brother and sister. ***  ROS:   Please see the history of present illness.    ROS All other systems reviewed and are negative.   PHYSICAL EXAM:   VS:  There were no vitals taken for this visit.   GEN: Well nourished, well developed, in no  acute distress  HEENT: normal  Neck: no JVD, carotid bruits, or masses Cardiac: ***RRR; no murmurs, rubs, or gallops,no edema  Respiratory:  clear to auscultation bilaterally, normal work of breathing GI: soft, nontender, nondistended, + BS MS: no deformity or atrophy  Skin: warm and dry, no rash Neuro:  Alert and Oriented x 3, Strength and sensation are intact Psych: euthymic mood, full affect  Wt Readings from Last 3 Encounters:  03/20/16 187 lb 13.3 oz (85.2 kg)  03/16/16 186 lb 3.2 oz (84.5 kg)  12/30/15 185 lb (83.9 kg)      Studies/Labs Reviewed:   EKG:  EKG is ordered today.  The ekg ordered today demonstrates ***  Recent Labs: 03/20/2016: BUN 14; Creatinine, Ser 1.46; Hemoglobin 10.5; Platelets 155; Potassium 3.8; Sodium 138   Lipid Panel    Component Value Date/Time   CHOL 115 (L) 01/11/2015 0820   TRIG 119 01/11/2015 0820   HDL 26 (L) 01/11/2015 0820   CHOLHDL 4.4 01/11/2015 0820   VLDL 24 01/11/2015 0820   LDLCALC 65 01/11/2015 0820   LDLDIRECT 122.5 12/02/2013 0922    Additional studies/ records that were reviewed today include:   Transthoracic Echocardiography 03/20/2016 Study Conclusions  - Left ventricle: The cavity size was normal. Wall thickness was increased in a pattern of mild LVH. Systolic function was normal. The estimated ejection fraction was in the range of 60% to 65%. Wall motion was normal; there were no regional wall motion abnormalities. Features are consistent with a pseudonormal left ventricular filling pattern, with concomitant abnormal relaxation and increased filling pressure (grade 2 diastolic dysfunction).   Left Heart Cath and Cors/Grafts Angiography 03/19/2016  1. Severe native vessel coronary artery disease with near occlusion of the entire left coronary artery distribution 2. Continued patency of the stented segments in the right coronary artery with mild in-stent restenosis 3. Status post CABG with  continued patency of the LIMA to LAD, chronic occlusion of the saphenous vein graft to acute marginal branch of the RCA, and severe stenosis in the proximal body of the graft to the obtuse marginal as well as severe OM subbranch stenosis, treated successfully with balloon angioplasty in the body of the vein graft and stenting in the native vessel beyond the vein graft.  Recommend:  Continue dual antiplatelet therapy with aspirin and clopidogrel  Check echocardiogram to assess LV function. Ventriculography not performed because of chronic kidney disease    ASSESSMENT & PLAN:    1. CAD s/p CABG 1993 and multiple PCI afterwards - Last cath 03/19/16 as above. Normal LV function.   2. HTN  3. HLD - No results found for requested labs within last 8760 hours. Continue Crestor   4.Chronic diastolic CHF  Medication Adjustments/Labs and Tests Ordered: Current medicines are reviewed at length with the patient today.  Concerns regarding medicines are outlined above.  Medication changes, Labs and Tests ordered today are listed in the Patient Instructions below. There are no Patient Instructions on file for this visit.   Jarrett Soho, [place]  04/02/2016 1:22 PM    [facility] [place], [place], [place]  [address] Phone: [phone]; Fax: [phone]

## 2016-04-05 ENCOUNTER — Ambulatory Visit: Payer: Medicare Other | Admitting: Physician Assistant

## 2016-04-11 ENCOUNTER — Encounter (INDEPENDENT_AMBULATORY_CARE_PROVIDER_SITE_OTHER): Payer: Self-pay

## 2016-04-11 ENCOUNTER — Ambulatory Visit (INDEPENDENT_AMBULATORY_CARE_PROVIDER_SITE_OTHER): Payer: Medicare Other | Admitting: Physician Assistant

## 2016-04-11 ENCOUNTER — Encounter: Payer: Self-pay | Admitting: Physician Assistant

## 2016-04-11 VITALS — BP 120/64 | HR 53 | Ht 66.0 in | Wt 182.8 lb

## 2016-04-11 DIAGNOSIS — I209 Angina pectoris, unspecified: Secondary | ICD-10-CM

## 2016-04-11 DIAGNOSIS — E7800 Pure hypercholesterolemia, unspecified: Secondary | ICD-10-CM

## 2016-04-11 DIAGNOSIS — I2510 Atherosclerotic heart disease of native coronary artery without angina pectoris: Secondary | ICD-10-CM | POA: Diagnosis not present

## 2016-04-11 DIAGNOSIS — N183 Chronic kidney disease, stage 3 unspecified: Secondary | ICD-10-CM

## 2016-04-11 DIAGNOSIS — R0602 Shortness of breath: Secondary | ICD-10-CM | POA: Diagnosis not present

## 2016-04-11 DIAGNOSIS — I10 Essential (primary) hypertension: Secondary | ICD-10-CM | POA: Diagnosis not present

## 2016-04-11 NOTE — Addendum Note (Signed)
Addended by: Eulis Foster on: 04/11/2016 02:21 PM   Modules accepted: Orders

## 2016-04-11 NOTE — Addendum Note (Signed)
Addended by: Michae Kava on: 04/11/2016 02:28 PM   Modules accepted: Orders

## 2016-04-11 NOTE — Progress Notes (Signed)
Cardiology Office Note:    Date:  04/11/2016   ID:  [name], DOB [birth year], MRN [id number]  PCP:  Allean Found, MD  Cardiologist:  Dr. Sherren Mocha   Electrophysiologist:  n/a  Referring MD: Allean Found, MD   Chief Complaint  Patient presents with  . Hospitalization Follow-up    Status post PCI    History of Present Illness:    [name] is a 71 y.o. male with a hx of CAD status post CABG and subsequent PCI, HTN, HL, CKD, GERD. He bypass in 1993. He has subsequently undergone PCI to the SVG-OM in 2009 and again in 2012 and most recently in 2014. He has also undergone multiple PCI procedures to the native RCA. Last seen by Dr. Burt Knack 03/16/16.  At that visit, he noted progressive exercise intolerance and dyspnea with activity consistent with his anginal equivalent. Cardiac catheterization was recommended. This demonstrated severe native vessel CAD with near occlusion of the entire left coronary artery distribution, patent stent segment in the RCA with mild ISR, patent LIMA-LAD, CTO of the SVG-RCA, severe stenosis in the stent in the proximal body of the SVG-OM as well as severe OM subbranch stenosis beyond the graft. SVG-OM was treated with balloon angioplasty in the native OM subbranch was treated with a DES. Echocardiogram demonstrated normal LV function. Post PCI course was uneventful. He returns for follow-up.  He is here alone. Since DC, he has been doing well. He notes more shortness of breath just after discharge. This has slowly improved since that time. He still notes dyspnea with exertion that is more than typical for him. He denies chest discomfort. He denies syncope. He denies orthopnea, PND or edema. Overall, his symptoms are much improved since prior to his PCI.  Prior CV studies:   The following studies were reviewed today:  Echo 03/20/16 Mild LVH, EF 60-65, Gr 2 DD  LHC 03/19/16 LAD proximal 100  RI ostial 100  LCx proximal 100, lateral OM1 95 RCA stent  patent with 30-40 ISR SVG-RPDA 100 LIMA-LAD patent SVG-OM1 proximal stent 80 ISR PCI: POBA to the SVG-OM1;Synergy Des 2.25x16 to the native lateral OM1 1. Severe native vessel coronary artery disease with near occlusion of the entire left coronary artery distribution 2. Continued patency of the stented segments in the right coronary artery with mild in-stent restenosis 3. Status post CABG with continued patency of the LIMA to LAD, chronic occlusion of the saphenous vein graft to acute marginal branch of the RCA, and severe stenosis in the proximal body of the graft to the obtuse marginal as well as severe OM subbranch stenosis, treated successfully with balloon angioplasty in the body of the vein graft and stenting in the native vessel beyond the vein graft.                   Past Medical History:  Diagnosis Date  . Anemia   . Blood transfusion   . CAD (coronary artery disease)    a. CABG 1993;  b. PCI of SVG to OM Jan 2012 (Drug-eluting stent x 2);  c. 09/2012 Cath/PCI: LM nl, LAD sev dzs, LCX 100, RCA patent stent, VG->AM 100, LIMA->LAD nl, VG->OM 95p ISR (PTCA only), patent distal stent. // d. LHC 3/18: pLAD 100, oRI 100, pLCx 100, Lat OM 95, RCA stent ok, S-PDA 100, L-LAD ok, S-OM1 stent 80 ISR >> PCI: POBA to S-OM1; DES to native Lat OM1  . CKD (chronic kidney disease), stage III   . Degeneration  of lumbar or lumbosacral intervertebral disc   . GERD (gastroesophageal reflux disease)   . H/O hiatal hernia   . History of echocardiogram    Echo 3/18: Mild LVH, EF 60-65, Gr 2 DD  . History of nephrolithiasis   . Hyperlipidemia   . Hyperplastic colon polyp   . Hypertension   . Myocardial infarction    hx of five   . Stricture and stenosis of esophagus     Past Surgical History:  Procedure Laterality Date  . ANGIOPLASTY    . BACK SURGERY    . CARDIAC CATHETERIZATION    . COLONOSCOPY    . CORONARY ARTERY BYPASS GRAFT  1993  . CORONARY STENT INTERVENTION N/A 03/19/2016    Procedure: Coronary Stent Intervention;  Surgeon: Sherren Mocha, MD;  Location: [place] [facility];  Service: Cardiovascular;  Laterality: N/A;  . HOLMIUM LASER APPLICATION Right 7/90/2409   Procedure: HOLMIUM LASER APPLICATION;  Surgeon: Jorja Loa, MD;  Location: [facility];  Service: Urology;  Laterality: Right;  . LEFT HEART CATH AND CORS/GRAFTS ANGIOGRAPHY N/A 03/19/2016   Procedure: Left Heart Cath and Cors/Grafts Angiography;  Surgeon: Sherren Mocha, MD;  Location: [place] [facility];  Service: Cardiovascular;  Laterality: N/A;  . LEFT HEART CATHETERIZATION WITH CORONARY/GRAFT ANGIOGRAM N/A 09/23/2012   Procedure: LEFT HEART CATHETERIZATION WITH Beatrix Fetters;  Surgeon: Peter M Martinique, MD;  Location: [facility] C[facility] LAB;  Service: Cardiovascular;  Laterality: N/A;  . LITHOTRIPSY    . Lumbar back    . NEPHROLITHOTOMY Right 09/17/2013   Procedure: NEPHROLITHOTOMY PERCUTANEOUS;  Surgeon: Jorja Loa, MD;  Location: [facility];  Service: Urology;  Laterality: Right;  . PERCUTANEOUS CORONARY INTERVENTION-BALLOON ONLY  09/23/2012   Procedure: PERCUTANEOUS CORONARY INTERVENTION-BALLOON ONLY;  Surgeon: Peter M Martinique, MD;  Location: [facility] CATH LAB;  Service: Cardiovascular;;  OM vein graft    Current Medications: Current Meds  Medication Sig  . amLODipine (NORVASC) 5 MG tablet Take 1 tablet (5 mg total) by mouth every morning.  Marland Kitchen aspirin 81 MG tablet Take 1 tablet (81 mg total) by mouth daily.  . clopidogrel (PLAVIX) 75 MG tablet Take 1 tablet (75 mg total) by mouth at bedtime.  . ferrous sulfate 325 (65 FE) MG tablet Take 325 mg by mouth daily with breakfast.  . isosorbide mononitrate (IMDUR) 60 MG 24 hr tablet Take 1.5 tablets (90 mg total) by mouth daily.  . metoprolol (LOPRESSOR) 50 MG tablet Take 1 tablet (50 mg total) by mouth 2 (two) times daily.  . nitroGLYCERIN (NITROSTAT) 0.4 MG SL tablet Place 1 tablet (0.4 mg total) under the tongue every 5 (five) minutes as needed for chest  pain. Up to 3 doses  . pantoprazole (PROTONIX) 40 MG tablet TAKE ONE (1) TABLET EACH DAY  . rosuvastatin (CRESTOR) 20 MG tablet Take 1 tablet (20 mg total) by mouth daily.   Current Facility-Administered Medications for the 04/11/16 encounter (Office Visit) with Liliane Shi, PA-C  Medication  . triamcinolone acetonide (KENALOG) 10 MG/ML injection 10 mg  . triamcinolone acetonide (KENALOG) 10 MG/ML injection 10 mg     Allergies:   Patient has no known allergies.   Social History   Social History  . Marital status: Married    Spouse name: N/A  . Number of children: 2  . Years of education: N/A   Occupational History  . Retired Scientist, research (life sciences)   Social History Main Topics  . Smoking status: Former Smoker  Quit date: 01/06/1987  . Smokeless tobacco: Former Systems developer  . Alcohol use No  . Drug use: No  . Sexual activity: Not Asked   Other Topics Concern  . None   Social History Narrative  . None     Family History  Problem Relation Age of Onset  . Coronary artery disease Mother 22    deceased.status post CABG  . Kidney cancer Mother   . Lung cancer Brother   . Lung cancer Sister   . CAD Other 76    5 brothers  . CAD Other 95    4 sisters  . Colon cancer Neg Hx      ROS:   Please see the history of present illness.    Review of Systems  Cardiovascular: Positive for irregular heartbeat.  Respiratory: Positive for shortness of breath.   Musculoskeletal: Positive for back pain and joint pain.   All other systems reviewed and are negative.   EKGs/Labs/Other Test Reviewed:    EKG:  EKG is ordered today.  The ekg ordered today demonstrates Sinus bradycardia, HR 53, left axis deviation, inferior Q waves, poor R-wave progression, QTc 390 ms, no significant change from prior tracings  Recent Labs: 03/20/2016: BUN 14; Creatinine, Ser 1.46; Hemoglobin 10.5; Platelets 155; Potassium 3.8; Sodium 138   Recent Lipid Panel    Component Value Date/Time   CHOL  115 (L) 01/11/2015 0820   TRIG 119 01/11/2015 0820   HDL 26 (L) 01/11/2015 0820   CHOLHDL 4.4 01/11/2015 0820   VLDL 24 01/11/2015 0820   LDLCALC 65 01/11/2015 0820   LDLDIRECT 122.5 12/02/2013 0922     Physical Exam:    VS:  BP 120/64   Pulse (!) 53   Ht 5\' 6"  (1.676 m)   Wt 182 lb 12.8 oz (82.9 kg)   BMI 29.50 kg/m     Wt Readings from Last 3 Encounters:  04/11/16 182 lb 12.8 oz (82.9 kg)  03/20/16 187 lb 13.3 oz (85.2 kg)  03/16/16 186 lb 3.2 oz (84.5 kg)     Physical Exam  Constitutional: He is oriented to person, place, and time. He appears well-developed and well-nourished. No distress.  HENT:  Head: Normocephalic and atraumatic.  Eyes: No scleral icterus.  Neck: Normal range of motion. No JVD present.  Cardiovascular: Normal rate, regular rhythm, S1 normal and S2 normal.   No murmur heard. Pulmonary/Chest: Effort normal and breath sounds normal. He has no wheezes. He has no rhonchi. He has no rales.  Abdominal: Soft. There is no tenderness.  Musculoskeletal: He exhibits no edema.  Left wrist without hematoma  Neurological: He is alert and oriented to person, place, and time.  Skin: Skin is warm and dry.  Psychiatric: He has a normal mood and affect.    ASSESSMENT:    1. Coronary artery disease involving native coronary artery of native heart without angina pectoris   2. Shortness of breath   3. CKD (chronic kidney disease), stage III   4. Essential hypertension   5. Pure hypercholesterolemia    PLAN:    In order of problems listed above:  1. Coronary artery disease involving native coronary artery of native heart without angina pectoris - Status post CABG in 1993 and multiple PCI procedures since. Most recent PCI 3/18 with angioplasty to the vein graft to the first obtuse marginal and DES to the native lateral OM1. His anginal symptoms have significantly. He still has some mild shortness of breath with activity that continues  to improve. He does have  moderate diastolic dysfunction on his echocardiogram and his LVEDP was 25. Exam is not consistent with volume overload. He needs a follow-up BMET today to recheck his renal function post catheterization. I will also obtain a BNP. If this is significantly elevated, I will add Lasix to his regimen. Continue aspirin, Plavix, nitrates, beta blocker, amlodipine, statin.  2. Shortness of breath -  As noted his breathing is improved since PCI. I believe that his angina is resolved. Shortness of breath may be related to deconditioning or diastolic heart failure. Exam is not suggestive of volume excess. If his BNP is extremely elevated, I will add Lasix.  3. CKD (chronic kidney disease), stage III -  Plan: Basic metabolic panel today  4. Essential hypertension -  BP controlled.  5. Pure hypercholesterolemia - Continue Rosuvastatin.  Dispo:  Return in about 3 months (around 07/11/2016) for Routine Follow Up, w/ Dr. Burt Knack, w/ Richardson Dopp, PA-C.   Medication Adjustments/Labs and Tests Ordered: Current medicines are reviewed at length with the patient today.  Concerns regarding medicines are outlined above.  Medication changes, Labs and Tests ordered today are outlined in the Patient Instructions noted below. Patient Instructions  Medication Instructions:  No changes.   Labwork: Today - BMET, BNP   Testing/Procedures: None   Follow-Up: [facility] [employer], [facility] 07/24/16 @ 8:15   Any Other Special Instructions Will Be Listed Below (If Applicable).  If you need a refill on your cardiac medications before your next appointment, please call your pharmacy.   Signed, Richardson Dopp, PA-C  04/11/2016 2:14 PM    [facility] [place], [place], [place]  [address] Phone: [phone]; Fax: [phone]

## 2016-04-11 NOTE — Patient Instructions (Addendum)
Medication Instructions:  No changes.   Labwork: Today - BMET, BNP   Testing/Procedures: None   Follow-Up: [employer], [facility] 07/24/16 @ 8:15   Any Other Special Instructions Will Be Listed Below (If Applicable).  If you need a refill on your cardiac medications before your next appointment, please call your pharmacy.

## 2016-04-11 NOTE — Addendum Note (Signed)
Addended by: Eulis Foster on: 04/11/2016 02:32 PM   Modules accepted: Orders

## 2016-04-11 NOTE — Addendum Note (Signed)
Addended by: Eulis Foster on: 04/11/2016 02:30 PM   Modules accepted: Orders

## 2016-04-12 LAB — BASIC METABOLIC PANEL
BUN/Creatinine Ratio: 11 (ref 10–24)
BUN: 17 mg/dL (ref 8–27)
CALCIUM: 9.2 mg/dL (ref 8.6–10.2)
CO2: 23 mmol/L (ref 18–29)
CREATININE: 1.54 mg/dL — AB (ref 0.76–1.27)
Chloride: 104 mmol/L (ref 96–106)
GFR calc Af Amer: 52 mL/min/{1.73_m2} — ABNORMAL LOW (ref >59)
GFR calc non Af Amer: 45 mL/min/{1.73_m2} — ABNORMAL LOW (ref >59)
Glucose: 117 mg/dL — ABNORMAL HIGH (ref 65–99)
POTASSIUM: 4.5 mmol/L (ref 3.5–5.2)
Sodium: 141 mmol/L (ref 134–144)

## 2016-04-12 LAB — PRO B NATRIURETIC PEPTIDE: NT-Pro BNP: 280 pg/mL (ref 0–376)

## 2016-04-13 ENCOUNTER — Encounter: Payer: Self-pay | Admitting: Cardiovascular Disease

## 2016-05-03 ENCOUNTER — Ambulatory Visit (INDEPENDENT_AMBULATORY_CARE_PROVIDER_SITE_OTHER): Payer: Medicare Other | Admitting: Cardiovascular Disease

## 2016-05-03 ENCOUNTER — Encounter: Payer: Self-pay | Admitting: Cardiovascular Disease

## 2016-05-03 VITALS — BP 148/66 | HR 52 | Ht 65.0 in | Wt 183.0 lb

## 2016-05-03 DIAGNOSIS — I130 Hypertensive heart and chronic kidney disease with heart failure and stage 1 through stage 4 chronic kidney disease, or unspecified chronic kidney disease: Secondary | ICD-10-CM

## 2016-05-03 DIAGNOSIS — I209 Angina pectoris, unspecified: Secondary | ICD-10-CM | POA: Diagnosis not present

## 2016-05-03 DIAGNOSIS — R0602 Shortness of breath: Secondary | ICD-10-CM | POA: Diagnosis not present

## 2016-05-03 DIAGNOSIS — I25119 Atherosclerotic heart disease of native coronary artery with unspecified angina pectoris: Secondary | ICD-10-CM | POA: Diagnosis not present

## 2016-05-03 MED ORDER — FUROSEMIDE 20 MG PO TABS: 20.0000 mg | ORAL_TABLET | Freq: Every day | ORAL | 3 refills | Status: DC

## 2016-05-03 NOTE — Patient Instructions (Addendum)
Medication Instructions:  Your physician has recommended you make the following change in your medication:  1. START Furosemide 20mg  take one tablet by mouth daily  Labwork: Your physician recommends that you return for lab work in: 2 WEEKS (BMP)  Testing/Procedures: No new orders.   Follow-Up: Your physician wants you to follow-up in: 6 MONTHS with Dr Burt Knack.  You will receive a reminder letter in the mail two months in advance. If you don't receive a letter, please call our office to schedule the follow-up appointment.  The pt has a pending appointment in the system with Richardson Dopp PA-C in July.  At this time the pt would like to keep this appointment as scheduled.    Any Other Special Instructions Will Be Listed Below (If Applicable).     If you need a refill on your cardiac medications before your next appointment, please call your pharmacy.

## 2016-05-03 NOTE — Progress Notes (Signed)
Cardiology Office Note Date:  05/03/2016   ID:  [name], DOB 1945-06-08, MRN [id number]  PCP:  Allean Found, MD  Cardiologist:  Sherren Mocha, MD    Chief Complaint  Patient presents with  . Follow-up    Angina Pectoris     History of Present Illness: [name] is a 70 y.o. male who presents for follow-up evaluation. The patient has extensive coronary artery disease with previous bypass surgery and multiple PCI procedures. Other problems include hypertension, hyperlipidemia, and stage III chronic kidney disease. His most recent PCI was in March 2018 when he underwent stenting of the native obtuse marginal through the saphenous vein graft and PTCA of the vein graft in an area of previous stent. He was seen in hospital follow-up by Richardson Dopp on 04/11/2016 and was noted to be stable. His angina had improved but he was complaining of worsening exertional dyspnea. BNP was in the normal range. There was no sign of volume excess on his physical exam and he was advised to continue his current medical program.  The patient is here alone today. He is doing relatively well. He still has episodes of shortness of breath with activity at times. He has not had any anginal chest pain since his last visit. He denies edema, orthopnea, or PND. He avoids salt.   Past Medical History:  Diagnosis Date  . Anemia   . Blood transfusion   . CAD (coronary artery disease)    a. CABG 1993;  b. PCI of SVG to OM Jan 2012 (Drug-eluting stent x 2);  c. 09/2012 Cath/PCI: LM nl, LAD sev dzs, LCX 100, RCA patent stent, VG->AM 100, LIMA->LAD nl, VG->OM 95p ISR (PTCA only), patent distal stent. // d. LHC 3/18: pLAD 100, oRI 100, pLCx 100, Lat OM 95, RCA stent ok, S-PDA 100, L-LAD ok, S-OM1 stent 80 ISR >> PCI: POBA to S-OM1; DES to native Lat OM1  . CKD (chronic kidney disease), stage III   . Degeneration of lumbar or lumbosacral intervertebral disc   . GERD (gastroesophageal reflux disease)   . H/O hiatal  hernia   . History of echocardiogram    Echo 3/18: Mild LVH, EF 60-65, Gr 2 DD  . History of nephrolithiasis   . Hyperlipidemia   . Hyperplastic colon polyp   . Hypertension   . Myocardial infarction (HCC)    hx of five   . Stricture and stenosis of esophagus     Past Surgical History:  Procedure Laterality Date  . ANGIOPLASTY    . BACK SURGERY    . CARDIAC CATHETERIZATION    . COLONOSCOPY    . CORONARY ARTERY BYPASS GRAFT  1993  . CORONARY STENT INTERVENTION N/A 03/19/2016   Procedure: Coronary Stent Intervention;  Surgeon: Sherren Mocha, MD;  Location: [facility];  Service: Cardiovascular;  Laterality: N/A;  . HOLMIUM LASER APPLICATION Right 02/10/4707   Procedure: HOLMIUM LASER APPLICATION;  Surgeon: Jorja Loa, MD;  Location: [facility];  Service: Urology;  Laterality: Right;  . LEFT HEART CATH AND CORS/GRAFTS ANGIOGRAPHY N/A 03/19/2016   Procedure: Left Heart Cath and Cors/Grafts Angiography;  Surgeon: Sherren Mocha, MD;  Location: [facility];  Service: Cardiovascular;  Laterality: N/A;  . LEFT HEART CATHETERIZATION WITH CORONARY/GRAFT ANGIOGRAM N/A 09/23/2012   Procedure: LEFT HEART CATHETERIZATION WITH Beatrix Fetters;  Surgeon: Peter M Martinique, MD;  Location: [facility] CATH LAB;  Service: Cardiovascular;  Laterality: N/A;  . LITHOTRIPSY    . Lumbar back    .  NEPHROLITHOTOMY Right 09/17/2013   Procedure: NEPHROLITHOTOMY PERCUTANEOUS;  Surgeon: Jorja Loa, MD;  Location: [facility];  Service: Urology;  Laterality: Right;  . PERCUTANEOUS CORONARY INTERVENTION-BALLOON ONLY  09/23/2012   Procedure: PERCUTANEOUS CORONARY INTERVENTION-BALLOON ONLY;  Surgeon: Peter M Martinique, MD;  Location: [facility] C[facility];  Service: Cardiovascular;;  OM vein graft    Current Outpatient Prescriptions  Medication Sig Dispense Refill  . amLODipine (NORVASC) 5 MG tablet Take 1 tablet (5 mg total) by mouth every morning. 90 tablet 3  . aspirin 81 MG tablet Take 1 tablet (81 mg  total) by mouth daily. 30 tablet 11  . clopidogrel (PLAVIX) 75 MG tablet Take 1 tablet (75 mg total) by mouth at bedtime. 90 tablet 3  . ferrous sulfate 325 (65 FE) MG tablet Take 325 mg by mouth daily with breakfast.    . isosorbide mononitrate (IMDUR) 60 MG 24 hr tablet Take 90 mg by mouth daily.    . metoprolol (LOPRESSOR) 50 MG tablet Take 1 tablet (50 mg total) by mouth 2 (two) times daily. 180 tablet 3  . nitroGLYCERIN (NITROSTAT) 0.4 MG SL tablet Place 1 tablet (0.4 mg total) under the tongue every 5 (five) minutes as needed for chest pain. Up to 3 doses 25 tablet 1  . pantoprazole (PROTONIX) 40 MG tablet Take 40 mg by mouth daily.    . rosuvastatin (CRESTOR) 20 MG tablet Take 1 tablet (20 mg total) by mouth daily. 90 tablet 3  . furosemide (LASIX) 20 MG tablet Take 1 tablet (20 mg total) by mouth daily. 90 tablet 3   Current Facility-Administered Medications  Medication Dose Route Frequency Provider Last Rate Last Dose  . triamcinolone acetonide (KENALOG) 10 MG/ML injection 10 mg  10 mg Other Once Harriet Masson, DPM      . triamcinolone acetonide (KENALOG) 10 MG/ML injection 10 mg  10 mg Other Once Harriet Masson, DPM        Allergies:   Patient has no known allergies.   Social History:  The patient  reports that he quit smoking about 29 years ago. He has quit using smokeless tobacco. He reports that he does not drink alcohol or use drugs.   Family History:  The patient's  family history includes CAD (age of onset: 37) in his other and other; Coronary artery disease (age of onset: 24) in his mother; Kidney cancer in his mother; Lung cancer in his brother and sister.    ROS:  Please see the history of present illness.  Otherwise, review of systems is positive for back pain, muscle pain.  All other systems are reviewed and negative.    PHYSICAL EXAM: VS:  BP (!) 148/66   Pulse (!) 52   Ht 5\' 5"  (1.651 m)   Wt 183 lb (83 kg)   BMI 30.45 kg/m  , BMI Body mass index is 30.45  kg/m. GEN: Well nourished, well developed, in no acute distress  HEENT: normal  Neck: no JVD, no masses. No carotid bruits Cardiac: RRR without murmur or gallop                Respiratory:  clear to auscultation bilaterally, normal work of breathing GI: soft, nontender, nondistended, + BS MS: no deformity or atrophy  Ext: no pretibial edema, pedal pulses 2+= bilaterally Skin: warm and dry, no rash Neuro:  Strength and sensation are intact Psych: euthymic mood, full affect  EKG:  EKG is not ordered today.  Recent Labs: 03/20/2016: Hemoglobin 10.5; Platelets  155 04/11/2016: BUN 17; Creatinine, Ser 1.54; NT-Pro BNP 280; Potassium 4.5; Sodium 141   Lipid Panel     Component Value Date/Time   CHOL 115 (L) 01/11/2015 0820   TRIG 119 01/11/2015 0820   HDL 26 (L) 01/11/2015 0820   CHOLHDL 4.4 01/11/2015 0820   VLDL 24 01/11/2015 0820   LDLCALC 65 01/11/2015 0820   LDLDIRECT 122.5 12/02/2013 0922      Wt Readings from Last 3 Encounters:  05/03/16 183 lb (83 kg)  04/11/16 182 lb 12.8 oz (82.9 kg)  03/20/16 187 lb 13.3 oz (85.2 kg)     Cardiac Studies Reviewed: ECHO 03-20-2016: Left ventricle:  The cavity size was normal. Wall thickness was increased in a pattern of mild LVH. Systolic function was normal. The estimated ejection fraction was in the range of 60% to 65%. Wall motion was normal; there were no regional wall motion abnormalities. Features are consistent with a pseudonormal left ventricular filling pattern, with concomitant abnormal relaxation and increased filling pressure (grade 2 diastolic dysfunction).  ------------------------------------------------------------------- Aortic valve:   Structurally normal valve.   Cusp separation was normal.  Doppler:  Transvalvular velocity was within the normal range. There was no stenosis. There was no regurgitation.  ------------------------------------------------------------------- Aorta:  Aortic root: The aortic root  was normal in size. Ascending aorta: The ascending aorta was normal in size.  ------------------------------------------------------------------- Mitral valve:   Structurally normal valve.   Leaflet separation was normal.  Doppler:  Transvalvular velocity was within the normal range. There was no evidence for stenosis. There was no regurgitation.    Peak gradient (D): 5 mm Hg.  ------------------------------------------------------------------- Left atrium:  The atrium was normal in size.  ------------------------------------------------------------------- Right ventricle:  The cavity size was normal. Systolic function was normal.  ------------------------------------------------------------------- Pulmonic valve:    The valve appears to be grossly normal. Doppler:  There was no significant regurgitation.  ------------------------------------------------------------------- Tricuspid valve:   The valve appears to be grossly normal. Doppler:  There was trivial regurgitation.  ------------------------------------------------------------------- Pulmonary artery:   Systolic pressure was within the normal range.   ------------------------------------------------------------------- Right atrium:  The atrium was normal in size.  ------------------------------------------------------------------- Pericardium:  There was no pericardial effusion.   ASSESSMENT AND PLAN: 1.  CAD, native vessel and bypass graft disease: With angina. The patient's medical program is reviewed. He should continue on lifelong dual antiplatelet therapy with aspirin and clopidogrel. His angina is currently well managed on a combination of metoprolol, isosorbide, and amlodipine.  2. Hypertensive heart disease with LVH and diastolic dysfunction/CKD 3: The patient has exertional dyspnea. He's on a good medical program. I recommended adding furosemide 20 mg daily. He will return in 2 weeks for follow-up labs. I  will see him back in 6 months.  3. Hyperlipidemia: Treated with Crestor 20 mg.  Current medicines are reviewed with the patient today.  The patient does not have concerns regarding medicines.  Labs/ tests ordered today include:   Orders Placed This Encounter  Procedures  . Basic metabolic panel    Disposition:   FU 6 months  Signed, Sherren Mocha, MD  05/03/2016 3:25 PM    [facility] [address], [place], [place]  [address] Phone: [phone]; Fax: [phone]

## 2016-05-17 ENCOUNTER — Other Ambulatory Visit: Payer: Medicare Other

## 2016-05-17 DIAGNOSIS — I25119 Atherosclerotic heart disease of native coronary artery with unspecified angina pectoris: Secondary | ICD-10-CM

## 2016-05-17 DIAGNOSIS — R0602 Shortness of breath: Secondary | ICD-10-CM | POA: Diagnosis not present

## 2016-05-17 LAB — BASIC METABOLIC PANEL
BUN/Creatinine Ratio: 13 (ref 10–24)
BUN: 17 mg/dL (ref 8–27)
CALCIUM: 8.6 mg/dL (ref 8.6–10.2)
CHLORIDE: 104 mmol/L (ref 96–106)
CO2: 20 mmol/L (ref 18–29)
Creatinine, Ser: 1.35 mg/dL — ABNORMAL HIGH (ref 0.76–1.27)
GFR calc non Af Amer: 53 mL/min/{1.73_m2} — ABNORMAL LOW (ref >59)
GFR, EST AFRICAN AMERICAN: 61 mL/min/{1.73_m2} (ref >59)
Glucose: 237 mg/dL — ABNORMAL HIGH (ref 65–99)
Potassium: 4.8 mmol/L (ref 3.5–5.2)
Sodium: 140 mmol/L (ref 134–144)

## 2016-05-22 ENCOUNTER — Encounter: Payer: Self-pay | Admitting: Cardiovascular Disease

## 2016-05-22 NOTE — Telephone Encounter (Signed)
This encounter was created in error - please disregard.

## 2016-05-22 NOTE — Telephone Encounter (Signed)
Patient returning your call, thanks. °

## 2016-05-27 IMAGING — RF DG NEPHROSTOGRAM RIGHT
12 series · 12 of 12 positions shown · IV contrast (omnipaque)
Comparison: 09/18/2013

CLINICAL DATA: Renal calculus, percutaneous nephrolithotomy
09/17/2013

EXAM:
RIGHT NEPHROSTOGRAM
:
CONTRAST:
15mL OMNIPAQUE IOHEXOL 300 MG/ML  SOLN
FLUOROSCOPY TIME:  54 seconds

[Series 1: run · 1 of 1 slices shown (1 of 11)]
[im 1/1]
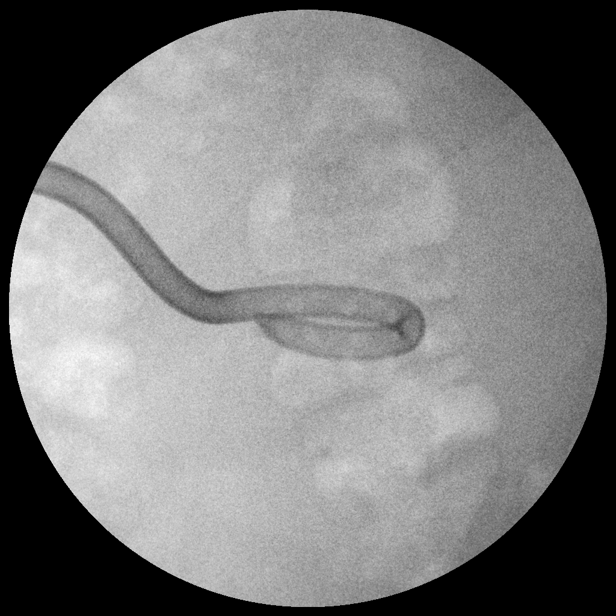

[Series 2: run · 1 of 1 slices shown (2 of 11)]
[im 1/1]
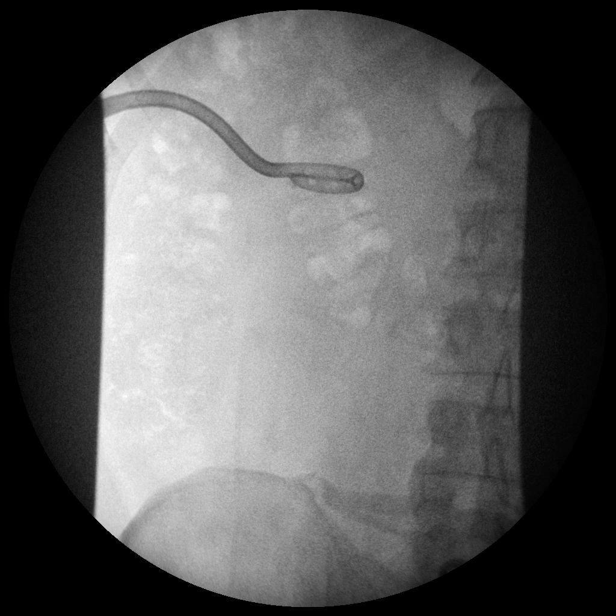

[Series 3: run · 1 of 1 slices shown (3 of 11)]
[im 1/1]
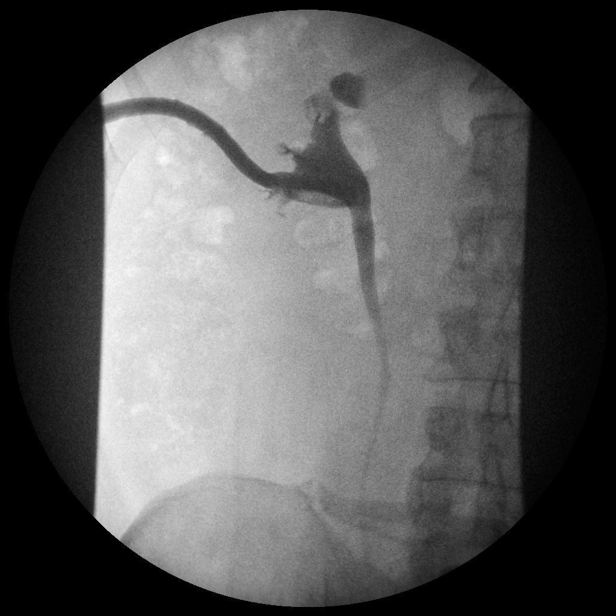

[Series 4: run · 1 of 1 slices shown (4 of 11)]
[im 1/1]
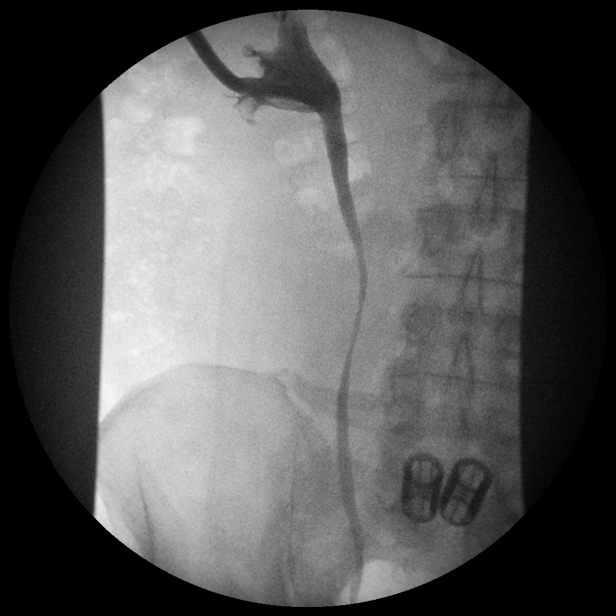

[Series 5: run · 1 of 1 slices shown (5 of 11)]
[im 1/1]
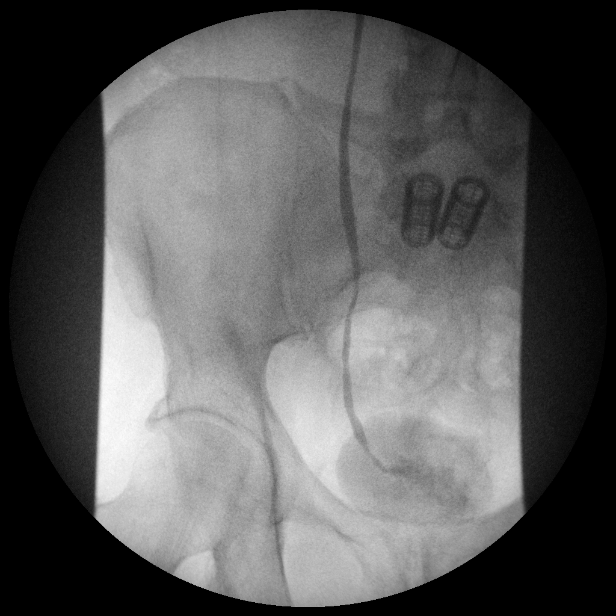

[Series 6: run · 1 of 1 slices shown (6 of 11)]
[im 1/1]
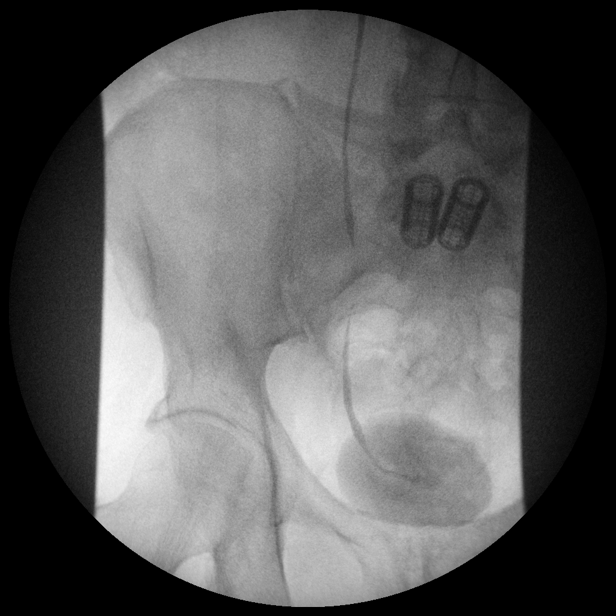

[Series 7: run · 1 of 1 slices shown (7 of 11)]
[im 1/1]
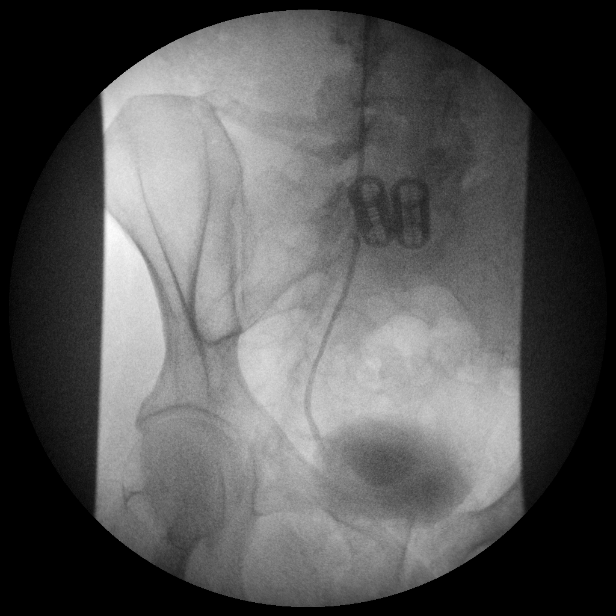

[Series 8: run · 1 of 1 slices shown (8 of 11)]
[im 1/1]
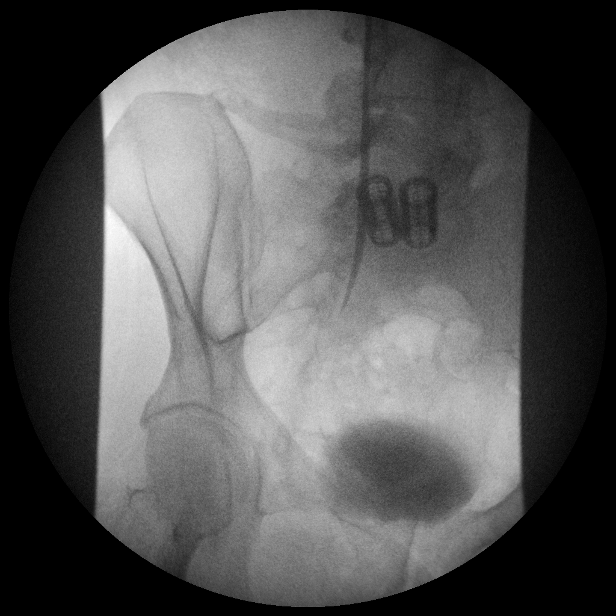

[Series 9: run · 1 of 1 slices shown (9 of 11)]
[im 1/1]
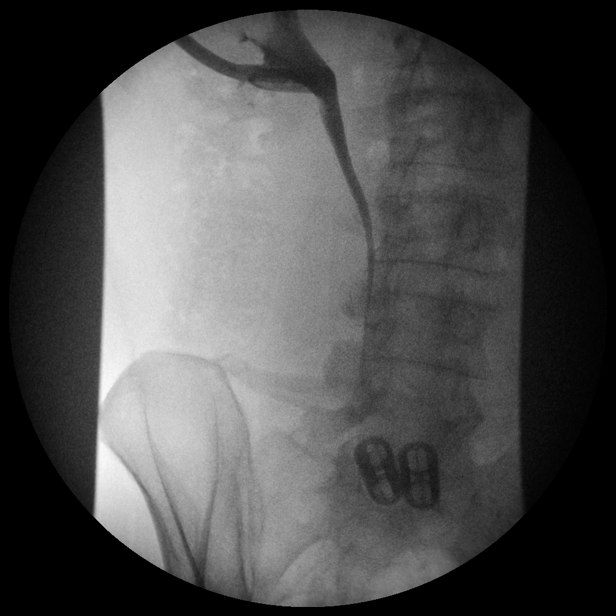

[Series 10: run · 1 of 1 slices shown (10 of 11)]
[im 1/1]
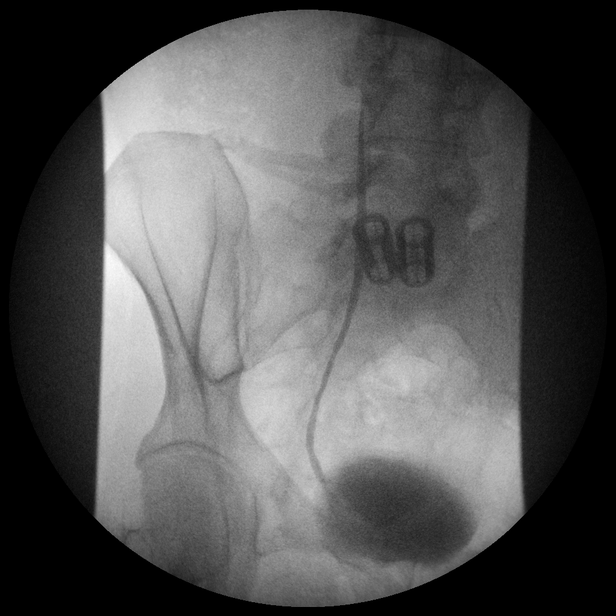

[Series 11: run · 1 of 1 slices shown (11 of 11)]
[im 1/1]
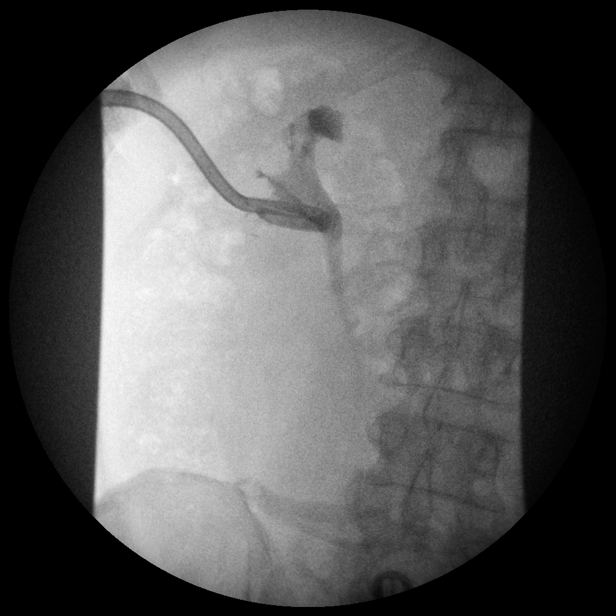

[Series 1001: view not recorded · 0.20mm/px · 1 of 1 slices shown]
[im 1/1]
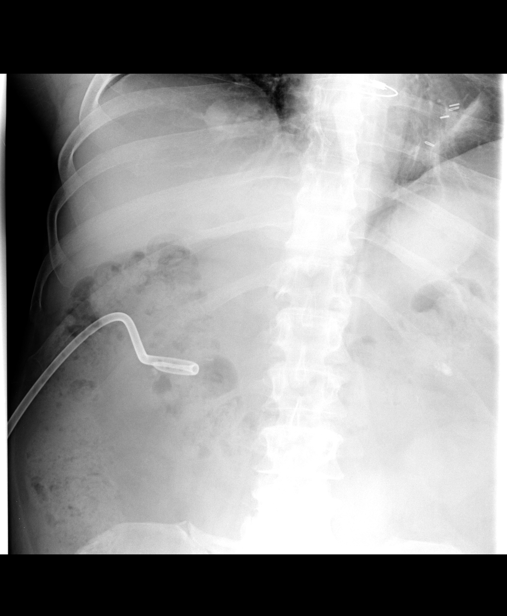

[12 of 12 positions shown; findings below may reference images not displayed]

FINDINGS: 15 mL of Omnipaque 300 was hand injected through an existing right
nephrostomy tube. There is satisfactory opacity occasion of the
right renal collecting system without a filling defect. There is no
hydronephrosis. There is normal immediate opacification of the right
ureter with normal drainage of contrast into the bladder. There is
mild irregularity of the distal ureter without focal stricture.

The nephrostomy tube was flushed with 10 mL of sterile saline. No
significant residual contrast is present within the collecting
system. The nephrostomy bag was attached to the existing catheter.
The patient tolerated the procedure well. There were no
complications.
IMPRESSION: 1. Satisfactory opacification of the right renal collecting system
and right ureter without obstruction. Mild distal right ureteral
irregularity without stricture. Normal emptying of contrast into the
bladder.

## 2016-07-08 IMAGING — CR DG ABDOMEN 1V
1 series · 1 of 1 positions shown · non-contrast
Comparison: 10/16/2013.

CLINICAL DATA: Lithotripsy.  Left renal stone

EXAM:
ABDOMEN - 1 VIEW

[t abdomen supine]
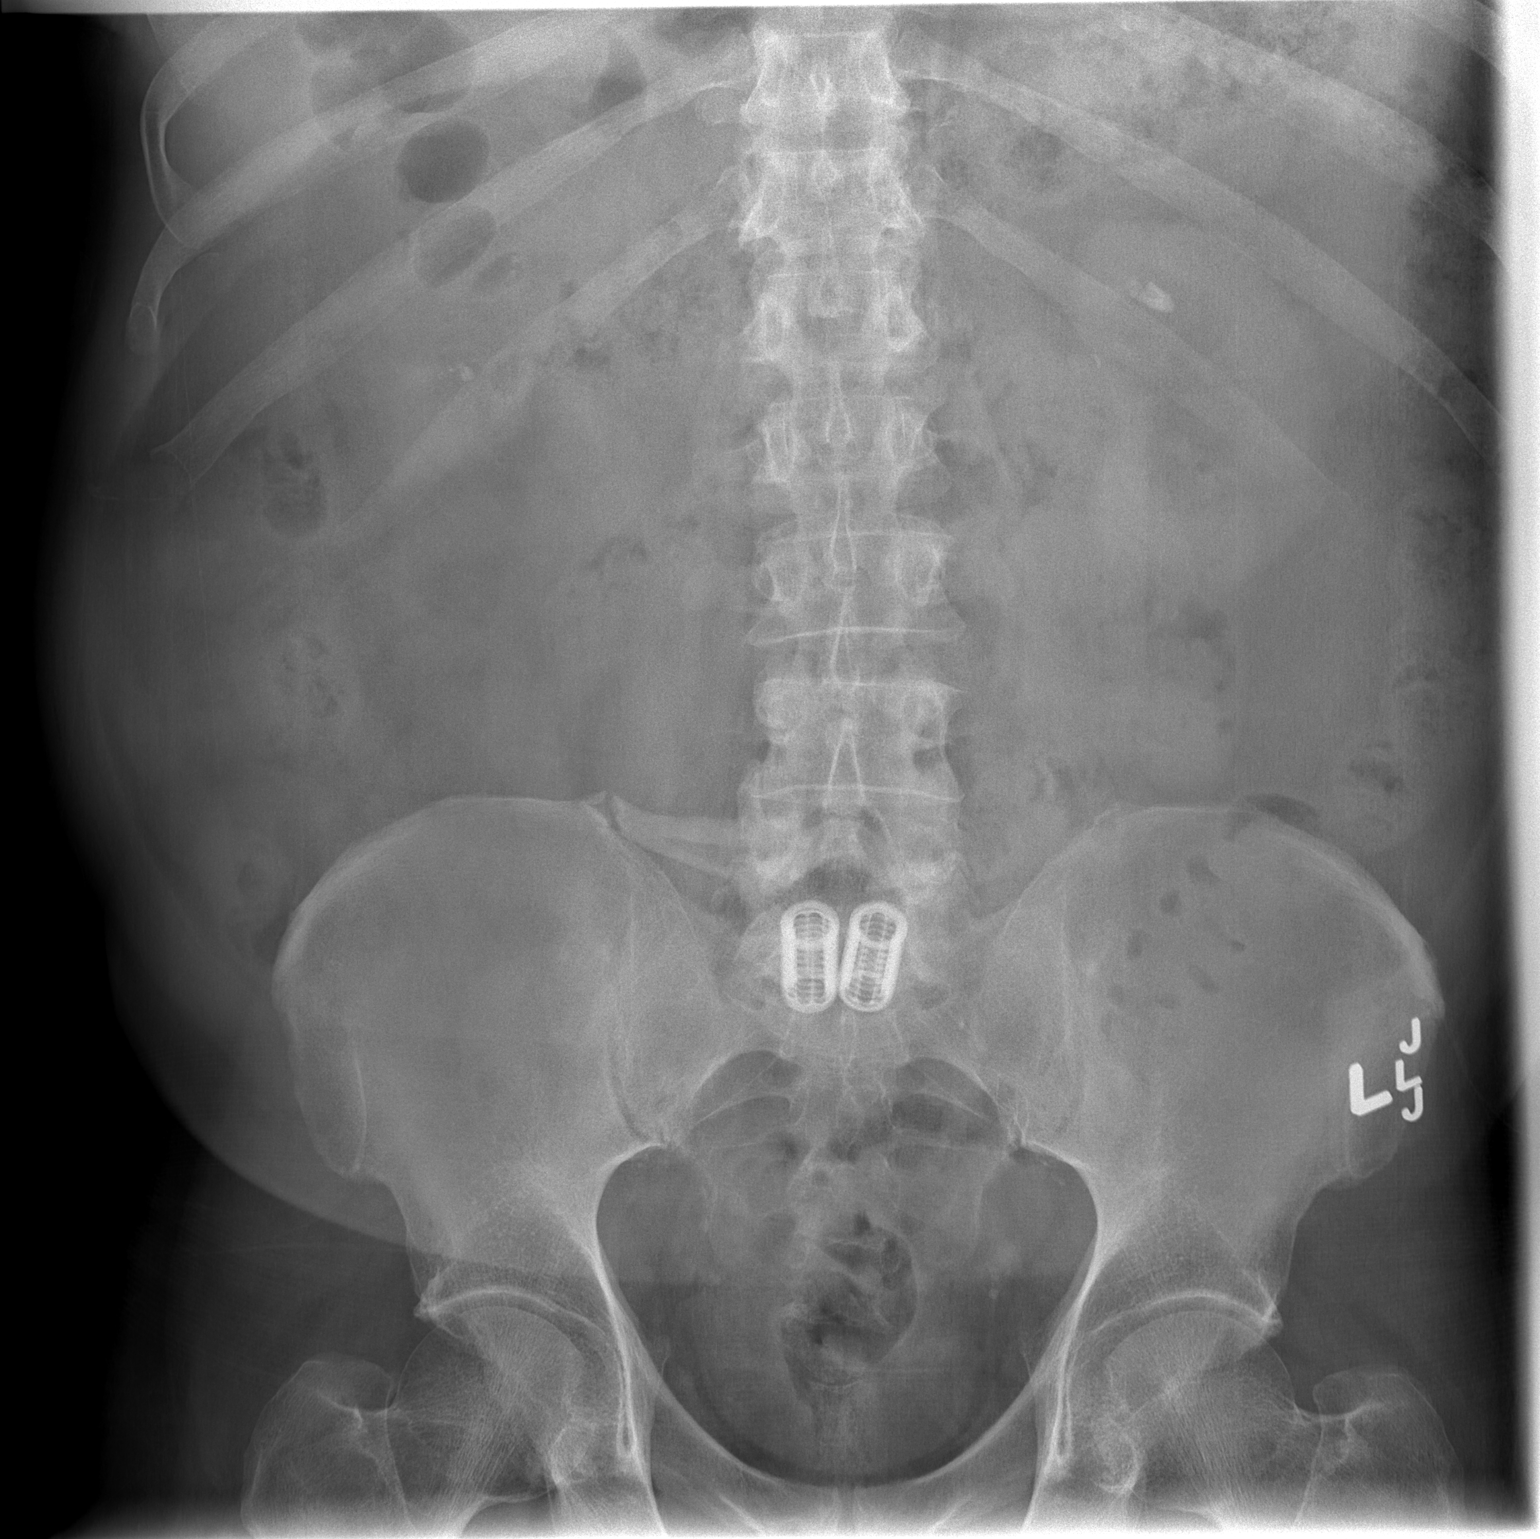

[1 of 1 positions shown; findings below may reference images not displayed]

FINDINGS: Bilateral nephrolithiasis. Similar finding noted on prior exam.
Calcifications in the mid abdomen and pelvis most consistent with a
phleboliths. Degenerative changes lumbar spine both hips. Prior
lumbosacral spine fusion.
IMPRESSION: Bilateral nephrolithiasis, stable from prior exam.

## 2016-07-23 NOTE — Progress Notes (Signed)
Cardiology Office Note:    Date:  07/24/2016   ID:  [name], DOB 01-26-1945, MRN [id number]  PCP:  Allean Found, MD  Cardiologist:  Dr. Sherren Mocha    Referring MD: Allean Found, MD   Chief Complaint  Patient presents with  . Follow-up    CAD    History of Present Illness:    [name] is a 71 y.o. male with a hx of CAD status post CABG and subsequent PCI, HTN, HL, CKD, GERD. He had bypass in 1993. He has subsequently undergone PCI to the SVG-OM in 2009 and again in 2012 and most recently in 2014. He has also undergone multiple PCI procedures to the native RCA. Last seen by Dr. Burt Knack 03/16/16.  At that visit, he noted progressive exercise intolerance and dyspnea with activity consistent with his anginal equivalent. Cardiac catheterization was recommended. This demonstrated severe native vessel CAD with near occlusion of the entire left coronary artery distribution, patent stent segment in the RCA with mild ISR, patent LIMA-LAD, CTO of the SVG-RCA, severe stenosis in the stent in the proximal body of the SVG-OM as well as severe OM subbranch stenosis beyond the graft. SVG-OM was treated with balloon angioplasty and the native OM subbranch was treated with a DES. Echocardiogram demonstrated normal LV function. Post PCI course was uneventful.  He was last seen by Dr. Burt Knack 5/18. He continued to have some episodes of dyspnea and low-dose Lasix was added to his medical regimen.  [name] returns for follow-up. He is here alone. He notes left-sided chest discomfort and arm pain with certain movements. He's really pointing to his shoulder. He denies recurrent anginal symptoms. He walks about 20 minutes a day without significant changes in his breathing. He did take Lasix once after seeing Dr. Burt Knack. It caused significant dizziness. He has not taken it since. He denies significant edema. He denies orthopnea or PND. He denies syncope or recurrent dizziness. He denies cough or  fever.  Prior CV studies:   The following studies were reviewed today:  Echo 03/20/16 Mild LVH, EF 60-65, Gr 2 DD  LHC 03/19/16 LAD proximal 100  RI ostial 100  LCx proximal 100, lateral OM1 95 RCA stent patent with 30-40 ISR SVG-RPDA 100 LIMA-LAD patent SVG-OM1 proximal stent 80 ISR PCI: POBA to the SVG-OM1;Synergy Des 2.25x16 to the native lateral OM1  Past Medical History:  Diagnosis Date  . Anemia   . Blood transfusion   . CAD (coronary artery disease)    a. CABG 1993;  b. PCI of SVG to OM Jan 2012 (Drug-eluting stent x 2);  c. 09/2012 Cath/PCI: LM nl, LAD sev dzs, LCX 100, RCA patent stent, VG->AM 100, LIMA->LAD nl, VG->OM 95p ISR (PTCA only), patent distal stent. // d. LHC 3/18: pLAD 100, oRI 100, pLCx 100, Lat OM 95, RCA stent ok, S-PDA 100, L-LAD ok, S-OM1 stent 80 ISR >> PCI: POBA to S-OM1; DES to native Lat OM1  . CKD (chronic kidney disease), stage III   . Degeneration of lumbar or lumbosacral intervertebral disc   . GERD (gastroesophageal reflux disease)   . H/O hiatal hernia   . History of echocardiogram    Echo 3/18: Mild LVH, EF 60-65, Gr 2 DD  . History of nephrolithiasis   . Hyperlipidemia   . Hyperplastic colon polyp   . Hypertension   . Myocardial infarction (HCC)    hx of five   . Stricture and stenosis of esophagus     Past Surgical  History:  Procedure Laterality Date  . ANGIOPLASTY    . BACK SURGERY    . CARDIAC CATHETERIZATION    . COLONOSCOPY    . CORONARY ARTERY BYPASS GRAFT  1993  . CORONARY STENT INTERVENTION N/A 03/19/2016   Procedure: Coronary Stent Intervention;  Surgeon: Sherren Mocha, MD;  Location: [facility];  Service: Cardiovascular;  Laterality: N/A;  . HOLMIUM LASER APPLICATION Right 2/70/6237   Procedure: HOLMIUM LASER APPLICATION;  Surgeon: Jorja Loa, MD;  Location: [facility];  Service: Urology;  Laterality: Right;  . LEFT HEART CATH AND CORS/GRAFTS ANGIOGRAPHY N/A 03/19/2016   Procedure: Left Heart Cath and  Cors/Grafts Angiography;  Surgeon: Sherren Mocha, MD;  Location: [facility];  Service: Cardiovascular;  Laterality: N/A;  . LEFT HEART CATHETERIZATION WITH CORONARY/GRAFT ANGIOGRAM N/A 09/23/2012   Procedure: LEFT HEART CATHETERIZATION WITH Beatrix Fetters;  Surgeon: Peter M Martinique, MD;  Location: [facility] CATH LAB;  Service: Cardiovascular;  Laterality: N/A;  . LITHOTRIPSY    . Lumbar back    . NEPHROLITHOTOMY Right 09/17/2013   Procedure: NEPHROLITHOTOMY PERCUTANEOUS;  Surgeon: Jorja Loa, MD;  Location: [facility];  Service: Urology;  Laterality: Right;  . PERCUTANEOUS CORONARY INTERVENTION-BALLOON ONLY  09/23/2012   Procedure: PERCUTANEOUS CORONARY INTERVENTION-BALLOON ONLY;  Surgeon: Peter M Martinique, MD;  Location: [facility] CATH LAB;  Service: Cardiovascular;;  OM vein graft    Current Medications: Current Meds  Medication Sig  . amLODipine (NORVASC) 5 MG tablet Take 1 tablet (5 mg total) by mouth every morning.  Marland Kitchen aspirin 81 MG tablet Take 1 tablet (81 mg total) by mouth daily.  . clopidogrel (PLAVIX) 75 MG tablet Take 1 tablet (75 mg total) by mouth at bedtime.  . ferrous sulfate 325 (65 FE) MG tablet Take 325 mg by mouth daily with breakfast.  . isosorbide mononitrate (IMDUR) 60 MG 24 hr tablet Take 90 mg by mouth daily.  . metoprolol (LOPRESSOR) 50 MG tablet Take 1 tablet (50 mg total) by mouth 2 (two) times daily.  . nitroGLYCERIN (NITROSTAT) 0.4 MG SL tablet Place 1 tablet (0.4 mg total) under the tongue every 5 (five) minutes as needed for chest pain. Up to 3 doses  . pantoprazole (PROTONIX) 40 MG tablet Take 40 mg by mouth daily.  . rosuvastatin (CRESTOR) 20 MG tablet Take 1 tablet (20 mg total) by mouth daily.   Current Facility-Administered Medications for the 07/24/16 encounter (Office Visit) with Douglas Dopp T, PA-C  Medication  . triamcinolone acetonide (KENALOG) 10 MG/ML injection 10 mg  . triamcinolone acetonide (KENALOG) 10 MG/ML injection 10 mg      Allergies:   Patient has no known allergies.   Social History   Social History  . Marital status: Married    Spouse name: N/A  . Number of children: 2  . Years of education: N/A   Occupational History  . Retired Scientist, research (life sciences)   Social History Main Topics  . Smoking status: Former Smoker    Quit date: 01/06/1987  . Smokeless tobacco: Former Systems developer  . Alcohol use No  . Drug use: No  . Sexual activity: Not Asked   Other Topics Concern  . None   Social History Narrative  . None     Family Hx: The patient's family history includes CAD (age of onset: 70) in his other and other; Coronary artery disease (age of onset: 53) in his mother; Kidney cancer in his mother; Lung cancer in his brother and  sister. There is no history of Colon cancer.  ROS:   Please see the history of present illness.    Review of Systems  Cardiovascular: Positive for chest pain.  Musculoskeletal: Positive for back pain and joint pain.   All other systems reviewed and are negative.   EKGs/Labs/Other Test Reviewed:    EKG:  EKG is  ordered today.  The ekg ordered today demonstrates Sinus bradycardia, HR 54, QTC 375 ms, no change from prior tracing  Recent Labs: 03/20/2016: Hemoglobin 10.5; Platelets 155 04/11/2016: NT-Pro BNP 280 05/17/2016: BUN 17; Creatinine, Ser 1.35; Potassium 4.8; Sodium 140   Recent Lipid Panel Lab Results  Component Value Date/Time   CHOL 115 (L) 01/11/2015 08:20 AM   TRIG 119 01/11/2015 08:20 AM   HDL 26 (L) 01/11/2015 08:20 AM   CHOLHDL 4.4 01/11/2015 08:20 AM   LDLCALC 65 01/11/2015 08:20 AM   LDLDIRECT 122.5 12/02/2013 09:22 AM    Physical Exam:    VS:  BP 128/70   Pulse (!) 54   Ht 5\' 5"  (1.651 m)   Wt 175 lb 1.9 oz (79.4 kg)   BMI 29.14 kg/m     Wt Readings from Last 3 Encounters:  07/24/16 175 lb 1.9 oz (79.4 kg)  05/03/16 183 lb (83 kg)  04/11/16 182 lb 12.8 oz (82.9 kg)     Physical Exam  Constitutional: He is oriented to person,  place, and time. He appears well-developed and well-nourished. No distress.  HENT:  Head: Normocephalic and atraumatic.  Eyes: No scleral icterus.  Neck: Normal range of motion. No JVD present.  Cardiovascular: Normal rate, regular rhythm, S1 normal and S2 normal.   No murmur heard. Pulmonary/Chest: Effort normal and breath sounds normal. He has no wheezes. He has no rhonchi. He has no rales.  Abdominal: Soft. There is no tenderness.  Musculoskeletal: He exhibits edema (trace edema bilat).  L arm with some pain with overhead extension (Neer's test); +crepitus over shoulder with PROM  Neurological: He is alert and oriented to person, place, and time.  Skin: Skin is warm and dry.  Psychiatric: He has a normal mood and affect.    ASSESSMENT:    1. Coronary artery disease involving native coronary artery of native heart without angina pectoris   2. Chronic left shoulder pain    PLAN:    In order of problems listed above:  1. Coronary artery disease involving native coronary artery of native heart without angina pectoris -  S/p CABG in 1993 and multiple PCI procedures since. Most recent PCI 3/18 with angioplasty to the vein graft to the first obtuse marginal and DES to the native lateral OM1. He has continued to have some mild dyspnea. BNP several months ago was normal. Dr. Burt Knack placed him on Lasix but this caused significant dizziness and he has not been able to take it on a regular basis. Overall, he denies significant angina.   -  Continue aspirin, Plavix, nitrates, beta blocker, amlodipine, statin.  -  He can take Lasix prn for edema.   2. Left shoulder pain His symptoms and exam seem to be consistent with rotator cuff tendinopathy versus shoulder impingement. I have recommended that he follow-up with primary care.  Dispo:  Return in about 6 months (around 01/24/2017) for Routine Follow Up, w/ Dr. Burt Knack.   Medication Adjustments/Labs and Tests Ordered: Current medicines are  reviewed at length with the patient today.  Concerns regarding medicines are outlined above.  Tests Ordered: Orders Placed This  Encounter  Procedures  . EKG 12-Lead   Medication Changes: Meds ordered this encounter  Medications  . furosemide (LASIX) 20 MG tablet    Sig: Take 1 tablet (20 mg total) by mouth daily as needed for edema.    Dispense:  30 tablet    Refill:  0    Order Specific Question:   Supervising Provider    Answer:   Sherren Mocha [[id number]]    Signed, Douglas Dopp, PA-C  07/24/2016 8:37 AM    [facility] [address], [place], [place]  [address] Phone: [phone]; Fax: [phone]

## 2016-07-24 ENCOUNTER — Encounter: Payer: Self-pay | Admitting: Physician Assistant

## 2016-07-24 ENCOUNTER — Encounter (INDEPENDENT_AMBULATORY_CARE_PROVIDER_SITE_OTHER): Payer: Self-pay

## 2016-07-24 ENCOUNTER — Ambulatory Visit (INDEPENDENT_AMBULATORY_CARE_PROVIDER_SITE_OTHER): Payer: Medicare Other | Admitting: Physician Assistant

## 2016-07-24 VITALS — BP 128/70 | HR 54 | Ht 65.0 in | Wt 175.1 lb

## 2016-07-24 DIAGNOSIS — M25512 Pain in left shoulder: Secondary | ICD-10-CM

## 2016-07-24 DIAGNOSIS — I209 Angina pectoris, unspecified: Secondary | ICD-10-CM

## 2016-07-24 DIAGNOSIS — I2510 Atherosclerotic heart disease of native coronary artery without angina pectoris: Secondary | ICD-10-CM

## 2016-07-24 DIAGNOSIS — G8929 Other chronic pain: Secondary | ICD-10-CM

## 2016-07-24 MED ORDER — FUROSEMIDE 20 MG PO TABS: 20.0000 mg | ORAL_TABLET | Freq: Every day | ORAL | 0 refills | Status: DC | PRN

## 2016-07-24 NOTE — Patient Instructions (Addendum)
Medication Instructions:  You can change the Lasix to take it only if needed for swelling.  Labwork: None   Testing/Procedures: None   Follow-Up: Your physician wants you to follow-up in: 6 MONTHS WITH DR. Emelda Fear will receive a reminder letter in the mail two months in advance. If you don't receive a letter, please call our office to schedule the follow-up appointment.  Follow up with Allean Found, MD for your shoulder pain.   Any Other Special Instructions Will Be Listed Below (If Applicable).  If you need a refill on your cardiac medications before your next appointment, please call your pharmacy.

## 2016-08-01 DIAGNOSIS — M722 Plantar fascial fibromatosis: Secondary | ICD-10-CM | POA: Diagnosis not present

## 2016-10-01 DIAGNOSIS — Z0000 Encounter for general adult medical examination without abnormal findings: Secondary | ICD-10-CM | POA: Diagnosis not present

## 2016-10-01 DIAGNOSIS — Z125 Encounter for screening for malignant neoplasm of prostate: Secondary | ICD-10-CM | POA: Diagnosis not present

## 2016-10-01 DIAGNOSIS — E1122 Type 2 diabetes mellitus with diabetic chronic kidney disease: Secondary | ICD-10-CM | POA: Diagnosis not present

## 2016-10-01 DIAGNOSIS — I2510 Atherosclerotic heart disease of native coronary artery without angina pectoris: Secondary | ICD-10-CM | POA: Diagnosis not present

## 2016-10-01 DIAGNOSIS — I10 Essential (primary) hypertension: Secondary | ICD-10-CM | POA: Diagnosis not present

## 2016-10-01 DIAGNOSIS — E785 Hyperlipidemia, unspecified: Secondary | ICD-10-CM | POA: Diagnosis not present

## 2016-10-01 DIAGNOSIS — Z23 Encounter for immunization: Secondary | ICD-10-CM | POA: Diagnosis not present

## 2016-10-02 DIAGNOSIS — E1122 Type 2 diabetes mellitus with diabetic chronic kidney disease: Secondary | ICD-10-CM | POA: Diagnosis not present

## 2016-10-02 DIAGNOSIS — E785 Hyperlipidemia, unspecified: Secondary | ICD-10-CM | POA: Diagnosis not present

## 2016-10-02 DIAGNOSIS — Z125 Encounter for screening for malignant neoplasm of prostate: Secondary | ICD-10-CM | POA: Diagnosis not present

## 2016-10-02 DIAGNOSIS — I10 Essential (primary) hypertension: Secondary | ICD-10-CM | POA: Diagnosis not present

## 2017-01-08 ENCOUNTER — Other Ambulatory Visit: Payer: Self-pay | Admitting: Physician Assistant

## 2017-01-08 DIAGNOSIS — E785 Hyperlipidemia, unspecified: Secondary | ICD-10-CM

## 2017-01-08 DIAGNOSIS — I10 Essential (primary) hypertension: Secondary | ICD-10-CM

## 2017-01-08 DIAGNOSIS — I25110 Atherosclerotic heart disease of native coronary artery with unstable angina pectoris: Secondary | ICD-10-CM

## 2017-04-17 ENCOUNTER — Telehealth: Payer: Self-pay | Admitting: Cardiovascular Disease

## 2017-04-17 NOTE — Telephone Encounter (Signed)
Attempted to call number in message. The phone rang until VM picked up stating the mailbox is full. Unable to leave message.  Called other number on file - line went straight to automated message saying the number has been changed or disconnected.  Will try again later.

## 2017-04-17 NOTE — Telephone Encounter (Signed)
New message  Patient's spouse calling with concerns about a blockage, arm pain, chest pain , and swelling around left chest area.  Declined to schedule with APP staff   Pt c/o of Chest Pain: STAT if CP now or developed within 24 hours  1. Are you having CP right now? NO  2. Are you experiencing any other symptoms (ex. SOB, nausea, vomiting, sweating)? NO  3. How long have you been experiencing CP? 5 DAYS  4. Is your CP continuous or coming and going? COMING AND GOING  5. Have you taken Nitroglycerin? YES     ?

## 2017-04-18 NOTE — Telephone Encounter (Signed)
Patient's wife reports the patient thinks he has a blockage again. He has had intermittent chest pain, swelling in his left chest, and arm pain for 5 days.  She said he took PRN Lasix yesterday and he is feeling much better. She states he is currently asymptomatic. She doesn't "really have any other information to share." She called in earlier today and scheduled an appointment with [place], [place] 5/2. Offered to look for earlier appointment, but she states the patient is fine for now.  Reiterated to her that if symptoms return, she needs to take the patient to the ED for evaluation given his extensive cardiac history. She replied that she understood, saying she knows when to get help. She was thankful for assistance and check-in.

## 2017-05-02 ENCOUNTER — Ambulatory Visit: Payer: Medicare Other | Admitting: Physician Assistant

## 2017-05-10 ENCOUNTER — Other Ambulatory Visit: Payer: Self-pay | Admitting: Cardiovascular Disease

## 2017-05-15 DIAGNOSIS — N200 Calculus of kidney: Secondary | ICD-10-CM | POA: Diagnosis not present

## 2017-05-30 ENCOUNTER — Ambulatory Visit (INDEPENDENT_AMBULATORY_CARE_PROVIDER_SITE_OTHER): Payer: Medicare Other | Admitting: Physician Assistant

## 2017-05-30 ENCOUNTER — Encounter: Payer: Self-pay | Admitting: Physician Assistant

## 2017-05-30 VITALS — BP 130/84 | HR 55 | Ht 65.0 in | Wt 179.4 lb

## 2017-05-30 DIAGNOSIS — I10 Essential (primary) hypertension: Secondary | ICD-10-CM

## 2017-05-30 DIAGNOSIS — I2510 Atherosclerotic heart disease of native coronary artery without angina pectoris: Secondary | ICD-10-CM

## 2017-05-30 DIAGNOSIS — R079 Chest pain, unspecified: Secondary | ICD-10-CM | POA: Diagnosis not present

## 2017-05-30 DIAGNOSIS — N183 Chronic kidney disease, stage 3 unspecified: Secondary | ICD-10-CM

## 2017-05-30 DIAGNOSIS — I5033 Acute on chronic diastolic (congestive) heart failure: Secondary | ICD-10-CM

## 2017-05-30 DIAGNOSIS — I25110 Atherosclerotic heart disease of native coronary artery with unstable angina pectoris: Secondary | ICD-10-CM | POA: Diagnosis not present

## 2017-05-30 MED ORDER — POTASSIUM CHLORIDE ER 10 MEQ PO TBCR: EXTENDED_RELEASE_TABLET | ORAL | 3 refills | Status: DC

## 2017-05-30 NOTE — Progress Notes (Signed)
Cardiology Office Note    Date:  05/30/2017   ID:  Shahram Alexopoulos, DOB [birth year], MRN [id number]  PCP:  Allean Found, MD  Cardiologist: Dr. Burt Knack  Chief Complaint: 6 Months follow up  History of Present Illness:   [name] is a 72 y.o. male with a hx of CAD status post CABG and subsequent PCI, HTN, HL, CKD, GERD and chronic diastolic CHF presents for follow up.   He has subsequently undergone PCI to the SVG-OM in 2009 and again in 2012 and most recently in 2014. He has also undergone multiple PCI procedures to the native RCA. Last cath 03/2016 showed severe native vessel CAD with near occlusion of the entire left coronary artery distribution, patent stent segment in the RCA with mild ISR, patent LIMA-LAD, CTO of the SVG-RCA, severe stenosis in the stent in the proximal body of the SVG-OM as well as severe OM subbranch stenosis beyond the graft. SVG-OM was treated with balloon angioplasty and the native OM subbranch was treated with a DES. Echocardiogram demonstrated normal LV function.  He was doing well on cardiac stand point when last seen by Richardson Dopp 07/2016. He had some L shoulder pain consistent with orthopedic etiology and advised to follow up with PCP.   Here today for follow up.  Patient has sensation of upper chest fullness.  He takes Lasix 2 days in a row with improvement.  Required to take once every few weeks.  He also has intermittent exertional dyspnea with tightness, mostly walking uphill.  Relieved with rest or sublingual nitroglycerin.  Stable for many months.  Usually requires to take 3 nitro per month.  He also has orthopnea without PND or lower extremity edema.  He does eat for high in salt..   Past Medical History:  Diagnosis Date  . Anemia   . Blood transfusion   . CAD (coronary artery disease)    a. CABG 1993;  b. PCI of SVG to OM Jan 2012 (Drug-eluting stent x 2);  c. 09/2012 Cath/PCI: LM nl, LAD sev dzs, LCX 100, RCA patent stent, VG->AM 100, LIMA->LAD nl,  VG->OM 95p ISR (PTCA only), patent distal stent. // d. LHC 3/18: pLAD 100, oRI 100, pLCx 100, Lat OM 95, RCA stent ok, S-PDA 100, L-LAD ok, S-OM1 stent 80 ISR >> PCI: POBA to S-OM1; DES to native Lat OM1  . CKD (chronic kidney disease), stage III ([place])   . Degeneration of lumbar or lumbosacral intervertebral disc   . GERD (gastroesophageal reflux disease)   . H/O hiatal hernia   . History of echocardiogram    Echo 3/18: Mild LVH, EF 60-65, Gr 2 DD  . History of nephrolithiasis   . Hyperlipidemia   . Hyperplastic colon polyp   . Hypertension   . Myocardial infarction (HCC)    hx of five   . Stricture and stenosis of esophagus     Past Surgical History:  Procedure Laterality Date  . ANGIOPLASTY    . BACK SURGERY    . CARDIAC CATHETERIZATION    . COLONOSCOPY    . CORONARY ARTERY BYPASS GRAFT  1993  . CORONARY STENT INTERVENTION N/A 03/19/2016   Procedure: Coronary Stent Intervention;  Surgeon: Sherren Mocha, MD;  Location: [facility];  Service: Cardiovascular;  Laterality: N/A;  . HOLMIUM LASER APPLICATION Right 2/75/1700   Procedure: HOLMIUM LASER APPLICATION;  Surgeon: Jorja Loa, MD;  Location: [facility];  Service: Urology;  Laterality: Right;  . LEFT HEART CATH AND CORS/GRAFTS  ANGIOGRAPHY N/A 03/19/2016   Procedure: Left Heart Cath and Cors/Grafts Angiography;  Surgeon: Sherren Mocha, MD;  Location: [facility];  Service: Cardiovascular;  Laterality: N/A;  . LEFT HEART CATHETERIZATION WITH CORONARY/GRAFT ANGIOGRAM N/A 09/23/2012   Procedure: LEFT HEART CATHETERIZATION WITH Beatrix Fetters;  Surgeon: Peter M Martinique, MD;  Location: [facility];  Service: Cardiovascular;  Laterality: N/A;  . LITHOTRIPSY    . Lumbar back    . NEPHROLITHOTOMY Right 09/17/2013   Procedure: NEPHROLITHOTOMY PERCUTANEOUS;  Surgeon: Jorja Loa, MD;  Location: [facility];  Service: Urology;  Laterality: Right;  . PERCUTANEOUS CORONARY INTERVENTION-BALLOON ONLY  09/23/2012    Procedure: PERCUTANEOUS CORONARY INTERVENTION-BALLOON ONLY;  Surgeon: Peter M Martinique, MD;  Location: [facility];  Service: Cardiovascular;;  OM vein graft    Current Medications: Prior to Admission medications   Medication Sig Start Date End Date Taking? Authorizing Provider  amLODipine (NORVASC) 5 MG tablet ONE TABLET DAILY IN THE MORNING 01/08/17   Honorio Devol, PA  aspirin 81 MG tablet Take 1 tablet (81 mg total) by mouth daily. 03/20/16   Delos Haring, PA-C  clopidogrel (PLAVIX) 75 MG tablet TAKE ONE TABLET DAILY AT BEDTIME 01/08/17   Christmas Faraci, PA  ferrous sulfate 325 (65 FE) MG tablet Take 325 mg by mouth daily with breakfast.    [provider]  furosemide (LASIX) 20 MG tablet Take 1 tablet (20 mg total) by mouth daily as needed for edema. 07/24/16 10/22/16  Richardson Dopp T, PA-C  isosorbide mononitrate (IMDUR) 60 MG 24 hr tablet TAKE ONE AND ONE-HALF TABLETS DAILY AS DIRECTED 01/08/17   Fusaye Wachtel, Crista Luria, PA  metoprolol tartrate (LOPRESSOR) 50 MG tablet TAKE ONE TABLET TWICE DAILY 01/08/17   Lailie Smead, PA  nitroGLYCERIN (NITROSTAT) 0.4 MG SL tablet Place 1 tablet (0.4 mg total) under the tongue every 5 (five) minutes as needed for chest pain. Up to 3 doses 03/22/16   Sherren Mocha, MD  pantoprazole (PROTONIX) 40 MG tablet TAKE ONE (1) TABLET EACH DAY. Please keep upcoming appt for future refills. Thank you 05/13/17   Sherren Mocha, MD  rosuvastatin (CRESTOR) 20 MG tablet TAKE ONE (1) TABLET EACH DAY 01/08/17   Leanor Kail, PA    Allergies:   Patient has no known allergies.   Social History   Socioeconomic History  . Marital status: Married    Spouse name: Not on file  . Number of children: 2  . Years of education: Not on file  . Highest education level: Not on file  Occupational History  . Occupation: Retired    Fish farm manager: DISABLED    Comment: Chemical engineer  Social Needs  . Financial resource strain: Not on file  . Food insecurity:     Worry: Not on file    Inability: Not on file  . Transportation needs:    Medical: Not on file    Non-medical: Not on file  Tobacco Use  . Smoking status: Former Smoker    Last attempt to quit: 01/06/1987    Years since quitting: 30.4  . Smokeless tobacco: Former Network engineer and Sexual Activity  . Alcohol use: No  . Drug use: No  . Sexual activity: Not on file  Lifestyle  . Physical activity:    Days per week: Not on file    Minutes per session: Not on file  . Stress: Not on file  Relationships  . Social connections:    Talks on phone: Not on file  Gets together: Not on file    Attends religious service: Not on file    Active member of club or organization: Not on file    Attends meetings of clubs or organizations: Not on file    Relationship status: Not on file  Other Topics Concern  . Not on file  Social History Narrative  . Not on file     Family History:  The patient's family history includes CAD (age of onset: 34) in his other and other; Coronary artery disease (age of onset: 84) in his mother; Kidney cancer in his mother; Lung cancer in his brother and sister.   ROS:   Please see the history of present illness.    ROS All other systems reviewed and are negative.   PHYSICAL EXAM:   VS:  BP 130/84   Pulse (!) 55   Ht 5\' 5"  (1.651 m)   Wt 179 lb 6.4 oz (81.4 kg)   SpO2 96%   BMI 29.85 kg/m    GEN: Well nourished, well developed, in no acute distress  HEENT: normal  Neck: no carotid bruits, or masses.  Elevated JVD noted Cardiac: RRR; no murmurs, rubs, or gallops,no edema  Respiratory: Bibasilar crackles  GI: soft, nontender, nondistended, + BS MS: no deformity or atrophy  Skin: warm and dry, no rash Neuro:  Alert and Oriented x 3, Strength and sensation are intact Psych: euthymic mood, full affect  Wt Readings from Last 3 Encounters:  05/30/17 179 lb 6.4 oz (81.4 kg)  07/24/16 175 lb 1.9 oz (79.4 kg)  05/03/16 183 lb (83 kg)      Studies/Labs  Reviewed:   EKG:  EKG is ordered today.  The ekg ordered today demonstrates sinus bradycardia at rate of 55 bpm  Recent Labs: No results found for requested labs within last 8760 hours.   Lipid Panel    Component Value Date/Time   CHOL 115 (L) 01/11/2015 0820   TRIG 119 01/11/2015 0820   HDL 26 (L) 01/11/2015 0820   CHOLHDL 4.4 01/11/2015 0820   VLDL 24 01/11/2015 0820   LDLCALC 65 01/11/2015 0820   LDLDIRECT 122.5 12/02/2013 0922    Additional studies/ records that were reviewed today include:   Echocardiogram: 03/2016 Study Conclusions  - Left ventricle: The cavity size was normal. Wall thickness was   increased in a pattern of mild LVH. Systolic function was normal.   The estimated ejection fraction was in the range of 60% to 65%.   Wall motion was normal; there were no regional wall motion   abnormalities. Features are consistent with a pseudonormal left   ventricular filling pattern, with concomitant abnormal relaxation   and increased filling pressure (grade 2 diastolic dysfunction).  Coronary Stent Intervention  03/2016  Left Heart Cath and Cors/Grafts Angiography  Conclusion   1. Severe native vessel coronary artery disease with near occlusion of the entire left coronary artery distribution 2. Continued patency of the stented segments in the right coronary artery with mild in-stent restenosis 3. Status post CABG with continued patency of the LIMA to LAD, chronic occlusion of the saphenous vein graft to acute marginal branch of the RCA, and severe stenosis in the proximal body of the graft to the obtuse marginal as well as severe OM subbranch stenosis, treated successfully with balloon angioplasty in the body of the vein graft and stenting in the native vessel beyond the vein graft.  Recommend:  Continue dual antiplatelet therapy with aspirin and clopidogrel  Check echocardiogram  to assess LV function. Ventriculography not performed because of chronic kidney disease        ASSESSMENT & PLAN:    1. Acute on chronic diastolic heart failure -His dyspnea on exertion and orthopnea most likely due to acute CHF greater than angina.  Advised to cut back on the salt intake.  Exam consistent with volume overload.  He will takes Lasix with supplemental potassium 3 times per week.  He will follow-up in a few weeks to review symptoms.  If no improvement or still required nitro will consider stress test.  Check bmet during follow-up.  2.  CAD status post CABG and multiple PCI -As above.  Continue aspirin, Plavix, Lopressor, Imdur and Crestor.  3.  Hyperlipidemia -LDL 65.  Continue Crestor.  4.  Hypertension -Blood pressure stable on current regimen.   Medication Adjustments/Labs and Tests Ordered: Current medicines are reviewed at length with the patient today.  Concerns regarding medicines are outlined above.  Medication changes, Labs and Tests ordered today are listed in the Patient Instructions below. Patient Instructions  Medication Instructions:  Your physician has recommended you make the following change in your medication: 1.  START Lasix 20 mg on Mondays, Wednesdays, & Fridays 2.  START Potassium 10 meq on Mondays, Wednesdays, & Fridays (when you take the fluid pill)   Labwork: None ordered  Testing/Procedures: None ordered  Follow-Up: Your physician recommends that you schedule a follow-up appointment in: 06/21/17 ARRIVE AT 7:45 TO SEE VIN Kable Haywood, PA-C  Any Other Special Instructions Will Be Listed Below (If Applicable).     If you need a refill on your cardiac medications before your next appointment, please call your pharmacy.      Jarrett Soho, [place]  05/30/2017 9:17 AM    [facility] [address], [place], [place]  [address] Phone: [phone]; Fax: [phone]

## 2017-05-30 NOTE — Patient Instructions (Addendum)
Medication Instructions:  Your physician has recommended you make the following change in your medication: 1.  START Lasix 20 mg on Mondays, Wednesdays, & Fridays 2.  START Potassium 10 meq on Mondays, Wednesdays, & Fridays (when you take the fluid pill)   Labwork: None ordered  Testing/Procedures: None ordered  Follow-Up: Your physician recommends that you schedule a follow-up appointment in: 06/21/17 ARRIVE AT 7:45 TO SEE VIN BHAGAT, PA-C  Any Other Special Instructions Will Be Listed Below (If Applicable).     If you need a refill on your cardiac medications before your next appointment, please call your pharmacy.

## 2017-06-21 ENCOUNTER — Encounter (INDEPENDENT_AMBULATORY_CARE_PROVIDER_SITE_OTHER): Payer: Self-pay

## 2017-06-21 ENCOUNTER — Ambulatory Visit (INDEPENDENT_AMBULATORY_CARE_PROVIDER_SITE_OTHER): Payer: Medicare Other | Admitting: Physician Assistant

## 2017-06-21 ENCOUNTER — Encounter: Payer: Self-pay | Admitting: *Deleted

## 2017-06-21 ENCOUNTER — Encounter: Payer: Self-pay | Admitting: Physician Assistant

## 2017-06-21 VITALS — BP 104/60 | HR 68 | Ht 65.0 in | Wt 177.8 lb

## 2017-06-21 DIAGNOSIS — I5033 Acute on chronic diastolic (congestive) heart failure: Secondary | ICD-10-CM

## 2017-06-21 DIAGNOSIS — I130 Hypertensive heart and chronic kidney disease with heart failure and stage 1 through stage 4 chronic kidney disease, or unspecified chronic kidney disease: Secondary | ICD-10-CM

## 2017-06-21 DIAGNOSIS — I10 Essential (primary) hypertension: Secondary | ICD-10-CM | POA: Diagnosis not present

## 2017-06-21 DIAGNOSIS — I2510 Atherosclerotic heart disease of native coronary artery without angina pectoris: Secondary | ICD-10-CM

## 2017-06-21 DIAGNOSIS — N183 Chronic kidney disease, stage 3 unspecified: Secondary | ICD-10-CM

## 2017-06-21 DIAGNOSIS — I25110 Atherosclerotic heart disease of native coronary artery with unstable angina pectoris: Secondary | ICD-10-CM

## 2017-06-21 LAB — COMPREHENSIVE METABOLIC PANEL
A/G RATIO: 1.8 (ref 1.2–2.2)
ALT: 17 IU/L (ref 0–44)
AST: 16 IU/L (ref 0–40)
Albumin: 4.4 g/dL (ref 3.5–4.8)
Alkaline Phosphatase: 102 IU/L (ref 39–117)
BILIRUBIN TOTAL: 0.3 mg/dL (ref 0.0–1.2)
BUN/Creatinine Ratio: 10 (ref 10–24)
BUN: 17 mg/dL (ref 8–27)
CHLORIDE: 102 mmol/L (ref 96–106)
CO2: 23 mmol/L (ref 20–29)
Calcium: 9.5 mg/dL (ref 8.6–10.2)
Creatinine, Ser: 1.64 mg/dL — ABNORMAL HIGH (ref 0.76–1.27)
GFR calc non Af Amer: 42 mL/min/{1.73_m2} — ABNORMAL LOW (ref >59)
GFR, EST AFRICAN AMERICAN: 48 mL/min/{1.73_m2} — AB (ref >59)
Globulin, Total: 2.4 g/dL (ref 1.5–4.5)
Glucose: 257 mg/dL — ABNORMAL HIGH (ref 65–99)
POTASSIUM: 5 mmol/L (ref 3.5–5.2)
Sodium: 137 mmol/L (ref 134–144)
Total Protein: 6.8 g/dL (ref 6.0–8.5)

## 2017-06-21 NOTE — Patient Instructions (Addendum)
Medication Instructions:  Your physician recommends that you continue on your current medications as directed. Please refer to the Current Medication list given to you today.   Labwork: TODAY:  CMET  Testing/Procedures: None ordered   Follow-Up: Your physician recommends that you schedule a follow-up appointment in: 3 MONTHS WITH DR. Burt Knack   Any Other Special Instructions Will Be Listed Below (If Applicable).

## 2017-06-21 NOTE — Progress Notes (Signed)
Cardiology Office Note    Date:  06/21/2017   ID:  [name], DOB [birth year], MRN [id number]  PCP:  Allean Found, MD  Cardiologist: Dr. Burt Knack  Chief Complaint: 4 weeks follow up   History of Present Illness:   [name] is a 72 y.o. male with a hx of CAD status post CABG and subsequent PCI, HTN, HL, CKD, GERD and chronic diastolic CHF presents for follow up.   He has subsequently undergone PCI to the SVG-OM in 2009 and again in 2012 and most recently in 2014. He has also undergone multiple PCI procedures to the native RCA. Last cath 03/2016 showed severe native vessel CAD with near occlusion of the entire left coronary artery distribution, patent stent segment in the RCA with mild ISR, patent LIMA-LAD, CTO of the SVG-RCA, severe stenosis in the stent in the proximal body of the SVG-OM as well as severe OM subbranch stenosis beyond the graft. SVG-OM was treated with balloon angioplastyandthe native OM subbranch was treated with a DES. Echocardiogram demonstrated normal LV function.  Seen by me 05/30/17. Symptoms concerning for acute diastolic CHF. Advised to cut back on salt. Given lasix 3 times/week.  Here today for follow up. Edema resolved and chest heaviness has improved since on lasix.  Chest fullness has improved as well.  Denies orthopnea, PND, syncope or dizziness.  He has intermittent abdominal swelling>> check CMP for albumin and LFT.  He is no longer eating diet high salt intake.   Past Medical History:  Diagnosis Date  . Anemia   . Blood transfusion   . CAD (coronary artery disease)    a. CABG 1993;  b. PCI of SVG to OM Jan 2012 (Drug-eluting stent x 2);  c. 09/2012 Cath/PCI: LM nl, LAD sev dzs, LCX 100, RCA patent stent, VG->AM 100, LIMA->LAD nl, VG->OM 95p ISR (PTCA only), patent distal stent. // d. LHC 3/18: pLAD 100, oRI 100, pLCx 100, Lat OM 95, RCA stent ok, S-PDA 100, L-LAD ok, S-OM1 stent 80 ISR >> PCI: POBA to S-OM1; DES to native Lat OM1  . CKD (chronic  kidney disease), stage III ([place])   . Degeneration of lumbar or lumbosacral intervertebral disc   . GERD (gastroesophageal reflux disease)   . H/O hiatal hernia   . History of echocardiogram    Echo 3/18: Mild LVH, EF 60-65, Gr 2 DD  . History of nephrolithiasis   . Hyperlipidemia   . Hyperplastic colon polyp   . Hypertension   . Myocardial infarction (HCC)    hx of five   . Stricture and stenosis of esophagus     Past Surgical History:  Procedure Laterality Date  . ANGIOPLASTY    . BACK SURGERY    . CARDIAC CATHETERIZATION    . COLONOSCOPY    . CORONARY ARTERY BYPASS GRAFT  1993  . CORONARY STENT INTERVENTION N/A 03/19/2016   Procedure: Coronary Stent Intervention;  Surgeon: Sherren Mocha, MD;  Location: [facility];  Service: Cardiovascular;  Laterality: N/A;  . HOLMIUM LASER APPLICATION Right 7/82/9562   Procedure: HOLMIUM LASER APPLICATION;  Surgeon: Jorja Loa, MD;  Location: [facility];  Service: Urology;  Laterality: Right;  . LEFT HEART CATH AND CORS/GRAFTS ANGIOGRAPHY N/A 03/19/2016   Procedure: Left Heart Cath and Cors/Grafts Angiography;  Surgeon: Sherren Mocha, MD;  Location: [facility];  Service: Cardiovascular;  Laterality: N/A;  . LEFT HEART CATHETERIZATION WITH CORONARY/GRAFT ANGIOGRAM N/A 09/23/2012   Procedure: LEFT HEART CATHETERIZATION WITH CORONARY/GRAFT  Cyril Loosen;  Surgeon: Peter M Martinique, MD;  Location: [facility];  Service: Cardiovascular;  Laterality: N/A;  . LITHOTRIPSY    . Lumbar back    . NEPHROLITHOTOMY Right 09/17/2013   Procedure: NEPHROLITHOTOMY PERCUTANEOUS;  Surgeon: Jorja Loa, MD;  Location: [facility];  Service: Urology;  Laterality: Right;  . PERCUTANEOUS CORONARY INTERVENTION-BALLOON ONLY  09/23/2012   Procedure: PERCUTANEOUS CORONARY INTERVENTION-BALLOON ONLY;  Surgeon: Peter M Martinique, MD;  Location: [facility] CATH LAB;  Service: Cardiovascular;;  OM vein graft    Current Medications: Prior to Admission medications     Medication Sig Start Date End Date Taking? Authorizing Provider  amLODipine (NORVASC) 5 MG tablet ONE TABLET DAILY IN THE MORNING 01/08/17   Ithzel Fedorchak, PA  aspirin 81 MG tablet Take 1 tablet (81 mg total) by mouth daily. 03/20/16   Delos Haring, PA-C  clopidogrel (PLAVIX) 75 MG tablet TAKE ONE TABLET DAILY AT BEDTIME 01/08/17   Augustine Brannick, PA  ferrous sulfate 325 (65 FE) MG tablet Take 325 mg by mouth daily with breakfast.    [provider]  furosemide (LASIX) 20 MG tablet Take 1 tablet (20 mg total) by mouth daily as needed for edema. 07/24/16 10/22/16  Richardson Dopp T, PA-C  isosorbide mononitrate (IMDUR) 60 MG 24 hr tablet TAKE ONE AND ONE-HALF TABLETS DAILY AS DIRECTED 01/08/17   Antionne Enrique, Crista Luria, PA  metoprolol tartrate (LOPRESSOR) 50 MG tablet TAKE ONE TABLET TWICE DAILY 01/08/17   Tal Neer, PA  nitroGLYCERIN (NITROSTAT) 0.4 MG SL tablet Place 1 tablet (0.4 mg total) under the tongue every 5 (five) minutes as needed for chest pain. Up to 3 doses 03/22/16   Sherren Mocha, MD  pantoprazole (PROTONIX) 40 MG tablet TAKE ONE (1) TABLET EACH DAY. Please keep upcoming appt for future refills. Thank you 05/13/17   Sherren Mocha, MD  potassium chloride (K-DUR) 10 MEQ tablet Take 1 tablet by mouth on Mondays, Wednesdays, & Fridays 05/30/17   Leanor Kail, PA  rosuvastatin (CRESTOR) 20 MG tablet TAKE ONE (1) TABLET EACH DAY 01/08/17   Leanor Kail, PA    Allergies:   Patient has no known allergies.   Social History   Socioeconomic History  . Marital status: Married    Spouse name: Not on file  . Number of children: 2  . Years of education: Not on file  . Highest education level: Not on file  Occupational History  . Occupation: Retired    Fish farm manager: DISABLED    Comment: Chemical engineer  Social Needs  . Financial resource strain: Not on file  . Food insecurity:    Worry: Not on file    Inability: Not on file  . Transportation needs:     Medical: Not on file    Non-medical: Not on file  Tobacco Use  . Smoking status: Former Smoker    Last attempt to quit: 01/06/1987    Years since quitting: 30.4  . Smokeless tobacco: Former Network engineer and Sexual Activity  . Alcohol use: No  . Drug use: No  . Sexual activity: Not on file  Lifestyle  . Physical activity:    Days per week: Not on file    Minutes per session: Not on file  . Stress: Not on file  Relationships  . Social connections:    Talks on phone: Not on file    Gets together: Not on file    Attends religious service: Not on file    Active member of club or  organization: Not on file    Attends meetings of clubs or organizations: Not on file    Relationship status: Not on file  Other Topics Concern  . Not on file  Social History Narrative  . Not on file     Family History:  The patient's family history includes CAD (age of onset: 12) in his other and other; Coronary artery disease (age of onset: 56) in his mother; Kidney cancer in his mother; Lung cancer in his brother and sister.   ROS:   Please see the history of present illness.    ROS All other systems reviewed and are negative.   PHYSICAL EXAM:   VS:  BP 104/60   Pulse 68   Ht 5\' 5"  (1.651 m)   Wt 177 lb 12.8 oz (80.6 kg)   BMI 29.59 kg/m    GEN: Well nourished, well developed, in no acute distress  HEENT: normal  Neck: no JVD, carotid bruits, or masses Cardiac: RRR; no murmurs, rubs, or gallops,no edema  Respiratory:  clear to auscultation bilaterally, normal work of breathing GI: soft, nontender, distended, + BS MS: no deformity or atrophy  Skin: warm and dry, no rash Neuro:  Alert and Oriented x 3, Strength and sensation are intact Psych: euthymic mood, full affect  Wt Readings from Last 3 Encounters:  06/21/17 177 lb 12.8 oz (80.6 kg)  05/30/17 179 lb 6.4 oz (81.4 kg)  07/24/16 175 lb 1.9 oz (79.4 kg)      Studies/Labs Reviewed:   EKG:  EKG is not  ordered today.    Recent  Labs: No results found for requested labs within last 8760 hours.   Lipid Panel    Component Value Date/Time   CHOL 115 (L) 01/11/2015 0820   TRIG 119 01/11/2015 0820   HDL 26 (L) 01/11/2015 0820   CHOLHDL 4.4 01/11/2015 0820   VLDL 24 01/11/2015 0820   LDLCALC 65 01/11/2015 0820   LDLDIRECT 122.5 12/02/2013 0922    Additional studies/ records that were reviewed today include:   Echocardiogram: 03/2016 Study Conclusions  - Left ventricle: The cavity size was normal. Wall thickness was increased in a pattern of mild LVH. Systolic function was normal. The estimated ejection fraction was in the range of 60% to 65%. Wall motion was normal; there were no regional wall motion abnormalities. Features are consistent with a pseudonormal left ventricular filling pattern, with concomitant abnormal relaxation and increased filling pressure (grade 2 diastolic dysfunction).  Coronary Stent Intervention  03/2016  Left Heart Cath and Cors/Grafts Angiography  Conclusion   1. Severe native vessel coronary artery disease with near occlusion of the entire left coronary artery distribution 2. Continued patency of the stented segments in the right coronary artery with mild in-stent restenosis 3. Status post CABG with continued patency of the LIMA to LAD, chronic occlusion of the saphenous vein graft to acute marginal branch of the RCA, and severe stenosis in the proximal body of the graft to the obtuse marginal as well as severe OM subbranch stenosis, treated successfully with balloon angioplasty in the body of the vein graft and stenting in the native vessel beyond the vein graft.  Recommend:  Continue dual antiplatelet therapy with aspirin and clopidogrel  Check echocardiogram to assess LV function. Ventriculography not performed because of chronic kidney disease    ASSESSMENT & PLAN:    1. Acute on chronic systolic heart failure Symptoms improved but not resolved.  Chest  fullness has improved as well.  His  symptoms is more consistent with CHF greater than angina.  However he also has exertional dyspnea without chest heaviness or pressure.  He has lost 2 pounds since last office visit.  Intermittent abdominal swelling with fluid intake.  Reviewed CHF sometimes in detail.  Check CMP today.  If stable renal function, will increase Lasix to daily.  2.  CAD -His heaviness is improved with diuretics.  As above.  Continue medical therapy with aspirin, Plavix, Imdur, beta-blocker and statin.  If no improvement he will let us know and will consider stress test.  3.  Hyperlipidemia -LDL 65.  Continue Crestor.  4.  Hypertension -Blood pressure stable on current regimen.  5. CKD stage III - Pending renal function check   Medication Adjustments/Labs and Tests Ordered: Current medicines are reviewed at length with the patient today.  Concerns regarding medicines are outlined above.  Medication changes, Labs and Tests ordered today are listed in the Patient Instructions below. Patient Instructions  Medication Instructions:  Your physician recommends that you continue on your current medications as directed. Please refer to the Current Medication list given to you today.   Labwork: TODAY:  CMET  Testing/Procedures: Your physician has requested that you have a lexiscan myoview. For further information please visit [email or web address]. Please follow instruction sheet, as given.    Follow-Up: Your physician recommends that you schedule a follow-up appointment in: 3 MONTHS WITH DR. Burt Knack   Any Other Special Instructions Will Be Listed Below (If Applicable).     [address], [place]  06/21/2017 8:12 AM    [address], [place], [place]  [address] Phone: [phone]; Fax: [phone]

## 2017-06-25 ENCOUNTER — Telehealth: Payer: Self-pay | Admitting: *Deleted

## 2017-06-25 DIAGNOSIS — Z79899 Other long term (current) drug therapy: Secondary | ICD-10-CM

## 2017-06-25 MED ORDER — POTASSIUM CHLORIDE ER 10 MEQ PO TBCR: 10.0000 meq | EXTENDED_RELEASE_TABLET | Freq: Every day | ORAL | 3 refills | Status: DC

## 2017-06-25 MED ORDER — FUROSEMIDE 20 MG PO TABS: 20.0000 mg | ORAL_TABLET | Freq: Every day | ORAL | 3 refills | Status: DC

## 2017-06-25 NOTE — Telephone Encounter (Signed)
-----   Message from [place], [place] sent at 06/24/2017  2:02 PM EDT ----- Yes increase both to daily.   ----- Message ----- From: Jeanann Lewandowsky, RMA Sent: 06/24/2017  12:45 PM To: Leanor Kail, PA  Just need to clarify, pt is now only taking K+ on Mondays, Wednesdays, and Fridays.  Do we increase it to daily with daily Lasix?

## 2017-07-09 ENCOUNTER — Other Ambulatory Visit: Payer: Medicare Other

## 2017-07-09 DIAGNOSIS — Z79899 Other long term (current) drug therapy: Secondary | ICD-10-CM

## 2017-07-09 LAB — BASIC METABOLIC PANEL
BUN/Creatinine Ratio: 10 (ref 10–24)
BUN: 16 mg/dL (ref 8–27)
CALCIUM: 9.5 mg/dL (ref 8.6–10.2)
CO2: 23 mmol/L (ref 20–29)
CREATININE: 1.6 mg/dL — AB (ref 0.76–1.27)
Chloride: 100 mmol/L (ref 96–106)
GFR, EST AFRICAN AMERICAN: 50 mL/min/{1.73_m2} — AB (ref >59)
GFR, EST NON AFRICAN AMERICAN: 43 mL/min/{1.73_m2} — AB (ref >59)
Glucose: 249 mg/dL — ABNORMAL HIGH (ref 65–99)
POTASSIUM: 5 mmol/L (ref 3.5–5.2)
Sodium: 137 mmol/L (ref 134–144)

## 2017-07-11 ENCOUNTER — Telehealth: Payer: Self-pay | Admitting: Physician Assistant

## 2017-07-11 NOTE — Telephone Encounter (Signed)
New Message: ° ° ° ° ° °Pt is returning a call for lab results. °

## 2017-07-12 NOTE — Telephone Encounter (Signed)
Unable to leave message Will try later./cy  

## 2017-07-12 NOTE — Telephone Encounter (Signed)
PT'S  WIFE AWARE  OF LAB RESULTS./CY 

## 2017-07-15 ENCOUNTER — Other Ambulatory Visit: Payer: Self-pay | Admitting: Physician Assistant

## 2017-07-15 DIAGNOSIS — E785 Hyperlipidemia, unspecified: Secondary | ICD-10-CM

## 2017-07-15 DIAGNOSIS — I25110 Atherosclerotic heart disease of native coronary artery with unstable angina pectoris: Secondary | ICD-10-CM

## 2017-07-15 DIAGNOSIS — I10 Essential (primary) hypertension: Secondary | ICD-10-CM

## 2017-08-12 ENCOUNTER — Other Ambulatory Visit: Payer: Self-pay | Admitting: Cardiovascular Disease

## 2017-09-18 ENCOUNTER — Ambulatory Visit (INDEPENDENT_AMBULATORY_CARE_PROVIDER_SITE_OTHER): Payer: Medicare Other | Admitting: Cardiovascular Disease

## 2017-09-18 ENCOUNTER — Encounter: Payer: Self-pay | Admitting: Cardiovascular Disease

## 2017-09-18 VITALS — BP 144/78 | HR 69 | Ht 65.0 in | Wt 178.4 lb

## 2017-09-18 DIAGNOSIS — E782 Mixed hyperlipidemia: Secondary | ICD-10-CM | POA: Diagnosis not present

## 2017-09-18 DIAGNOSIS — I5032 Chronic diastolic (congestive) heart failure: Secondary | ICD-10-CM

## 2017-09-18 DIAGNOSIS — I2510 Atherosclerotic heart disease of native coronary artery without angina pectoris: Secondary | ICD-10-CM

## 2017-09-18 DIAGNOSIS — I25110 Atherosclerotic heart disease of native coronary artery with unstable angina pectoris: Secondary | ICD-10-CM

## 2017-09-18 DIAGNOSIS — I10 Essential (primary) hypertension: Secondary | ICD-10-CM

## 2017-09-18 MED ORDER — CLOPIDOGREL BISULFATE 75 MG PO TABS: 75.0000 mg | ORAL_TABLET | Freq: Every day | ORAL | 3 refills | Status: DC

## 2017-09-18 MED ORDER — FUROSEMIDE 20 MG PO TABS: 20.0000 mg | ORAL_TABLET | Freq: Every day | ORAL | 3 refills | Status: DC

## 2017-09-18 NOTE — Progress Notes (Signed)
Cardiology Office Note:    Date:  09/18/2017   ID:  [name], DOB 1945/01/07, MRN [id number]  PCP:  Allean Found, MD  Cardiologist:  Sherren Mocha, MD  Electrophysiologist:  None   Referring MD: Allean Found, MD   Chief Complaint  Patient presents with  . Chest Pain    History of Present Illness:    [name] is a 72 y.o. male with a hx of coronary artery disease and diastolic heart failure, presented for follow-up evaluation.  The patient has extensive coronary artery disease with previous bypass surgery and multiple PCI procedures. Other problems include hypertension, hyperlipidemia, and stage III chronic kidney disease. His most recent PCI was in March 2018 when he underwent stenting of the native obtuse marginal through the saphenous vein graft and PTCA of the vein graft in an area of previous stent.  He was seen earlier this year with symptoms of shortness of breath and edema.  Daily furosemide was started and he improved significantly.  He has subsequently ran out of furosemide and clopidogrel without refills at his pharmacy.  He requested to have these medicines refilled today.  He has chest discomfort when he uses the weed eater, causing him to stop. Symptoms resolve quickly with rest. With walking he has no chest discomfort. He is limited by bilateral hip pain.  He denies shortness of breath, orthopnea, or PND.  He has no heart palpitations, lightheadedness, or syncope.  Past Medical History:  Diagnosis Date  . Anemia   . Blood transfusion   . CAD (coronary artery disease)    a. CABG 1993;  b. PCI of SVG to OM Jan 2012 (Drug-eluting stent x 2);  c. 09/2012 Cath/PCI: LM nl, LAD sev dzs, LCX 100, RCA patent stent, VG->AM 100, LIMA->LAD nl, VG->OM 95p ISR (PTCA only), patent distal stent. // d. LHC 3/18: pLAD 100, oRI 100, pLCx 100, Lat OM 95, RCA stent ok, S-PDA 100, L-LAD ok, S-OM1 stent 80 ISR >> PCI: POBA to S-OM1; DES to native Lat OM1  . CKD (chronic kidney  disease), stage III ([place])   . Degeneration of lumbar or lumbosacral intervertebral disc   . GERD (gastroesophageal reflux disease)   . H/O hiatal hernia   . History of echocardiogram    Echo 3/18: Mild LVH, EF 60-65, Gr 2 DD  . History of nephrolithiasis   . Hyperlipidemia   . Hyperplastic colon polyp   . Hypertension   . Myocardial infarction (HCC)    hx of five   . Stricture and stenosis of esophagus     Past Surgical History:  Procedure Laterality Date  . ANGIOPLASTY    . BACK SURGERY    . CARDIAC CATHETERIZATION    . COLONOSCOPY    . CORONARY ARTERY BYPASS GRAFT  1993  . CORONARY STENT INTERVENTION N/A 03/19/2016   Procedure: Coronary Stent Intervention;  Surgeon: Sherren Mocha, MD;  Location: [facility];  Service: Cardiovascular;  Laterality: N/A;  . HOLMIUM LASER APPLICATION Right 06/30/4763   Procedure: HOLMIUM LASER APPLICATION;  Surgeon: Jorja Loa, MD;  Location: [facility];  Service: Urology;  Laterality: Right;  . LEFT HEART CATH AND CORS/GRAFTS ANGIOGRAPHY N/A 03/19/2016   Procedure: Left Heart Cath and Cors/Grafts Angiography;  Surgeon: Sherren Mocha, MD;  Location: [facility];  Service: Cardiovascular;  Laterality: N/A;  . LEFT HEART CATHETERIZATION WITH CORONARY/GRAFT ANGIOGRAM N/A 09/23/2012   Procedure: LEFT HEART CATHETERIZATION WITH Beatrix Fetters;  Surgeon: Peter M Martinique, MD;  Location: [facility];  Service: Cardiovascular;  Laterality: N/A;  . LITHOTRIPSY    . Lumbar back    . NEPHROLITHOTOMY Right 09/17/2013   Procedure: NEPHROLITHOTOMY PERCUTANEOUS;  Surgeon: Jorja Loa, MD;  Location: [facility];  Service: Urology;  Laterality: Right;  . PERCUTANEOUS CORONARY INTERVENTION-BALLOON ONLY  09/23/2012   Procedure: PERCUTANEOUS CORONARY INTERVENTION-BALLOON ONLY;  Surgeon: Peter M Martinique, MD;  Location: [facility];  Service: Cardiovascular;;  OM vein graft    Current Medications: Current Meds  Medication Sig  . amLODipine  (NORVASC) 5 MG tablet ONE TABLET EVERY MORNING  . clopidogrel (PLAVIX) 75 MG tablet Take 1 tablet (75 mg total) by mouth at bedtime.  . ferrous sulfate 325 (65 FE) MG tablet Take 325 mg by mouth daily with breakfast.  . furosemide (LASIX) 20 MG tablet Take 1 tablet (20 mg total) by mouth daily.  . isosorbide mononitrate (IMDUR) 60 MG 24 hr tablet TAKE ONE AND ONE-HALF TABLETS DAILY  . metoprolol tartrate (LOPRESSOR) 50 MG tablet TAKE ONE TABLET TWICE DAILY  . nitroGLYCERIN (NITROSTAT) 0.4 MG SL tablet Place 1 tablet (0.4 mg total) under the tongue every 5 (five) minutes as needed for chest pain. Up to 3 doses  . pantoprazole (PROTONIX) 40 MG tablet TAKE ONE (1) TABLET EACH DAY  . potassium chloride (K-DUR) 10 MEQ tablet Take 1 tablet (10 mEq total) by mouth daily.  . rosuvastatin (CRESTOR) 20 MG tablet TAKE ONE (1) TABLET EACH DAY  . [DISCONTINUED] clopidogrel (PLAVIX) 75 MG tablet ONE TABLET AT BEDTIME  . [DISCONTINUED] furosemide (LASIX) 20 MG tablet Take 1 tablet (20 mg total) by mouth daily.   Current Facility-Administered Medications for the 09/18/17 encounter (Office Visit) with Sherren Mocha, MD  Medication  . triamcinolone acetonide (KENALOG) 10 MG/ML injection 10 mg  . triamcinolone acetonide (KENALOG) 10 MG/ML injection 10 mg     Allergies:   Patient has no known allergies.   Social History   Socioeconomic History  . Marital status: Married    Spouse name: Not on file  . Number of children: 2  . Years of education: Not on file  . Highest education level: Not on file  Occupational History  . Occupation: Retired    Fish farm manager: DISABLED    Comment: Chemical engineer  Social Needs  . Financial resource strain: Not on file  . Food insecurity:    Worry: Not on file    Inability: Not on file  . Transportation needs:    Medical: Not on file    Non-medical: Not on file  Tobacco Use  . Smoking status: Former Smoker    Last attempt to quit: 01/06/1987    Years since quitting:  30.7  . Smokeless tobacco: Former Network engineer and Sexual Activity  . Alcohol use: No  . Drug use: No  . Sexual activity: Not on file  Lifestyle  . Physical activity:    Days per week: Not on file    Minutes per session: Not on file  . Stress: Not on file  Relationships  . Social connections:    Talks on phone: Not on file    Gets together: Not on file    Attends religious service: Not on file    Active member of club or organization: Not on file    Attends meetings of clubs or organizations: Not on file    Relationship status: Not on file  Other Topics Concern  . Not on file  Social History Narrative  .  Not on file     Family History: The patient's family history includes CAD (age of onset: 36) in his other and other; Coronary artery disease (age of onset: 39) in his mother; Kidney cancer in his mother; Lung cancer in his brother and sister. There is no history of Colon cancer.  ROS:   Please see the history of present illness.    Positive for weight gain, leg swelling, hearing loss, visual disturbance, back pain. All other systems reviewed and are negative.  EKGs/Labs/Other Studies Reviewed:    The following studies were reviewed today: Echo 03-20-2016: Left ventricle:  The cavity size was normal. Wall thickness was increased in a pattern of mild LVH. Systolic function was normal. The estimated ejection fraction was in the range of 60% to 65%. Wall motion was normal; there were no regional wall motion abnormalities. Features are consistent with a pseudonormal left ventricular filling pattern, with concomitant abnormal relaxation and increased filling pressure (grade 2 diastolic dysfunction).  ------------------------------------------------------------------- Aortic valve:   Structurally normal valve.   Cusp separation was normal.  Doppler:  Transvalvular velocity was within the normal range. There was no stenosis. There was no  regurgitation.  ------------------------------------------------------------------- Aorta:  Aortic root: The aortic root was normal in size. Ascending aorta: The ascending aorta was normal in size.  ------------------------------------------------------------------- Mitral valve:   Structurally normal valve.   Leaflet separation was normal.  Doppler:  Transvalvular velocity was within the normal range. There was no evidence for stenosis. There was no regurgitation.    Peak gradient (D): 5 mm Hg.  ------------------------------------------------------------------- Left atrium:  The atrium was normal in size.  ------------------------------------------------------------------- Right ventricle:  The cavity size was normal. Systolic function was normal.  ------------------------------------------------------------------- Pulmonic valve:    The valve appears to be grossly normal. Doppler:  There was no significant regurgitation.  ------------------------------------------------------------------- Tricuspid valve:   The valve appears to be grossly normal. Doppler:  There was trivial regurgitation.  ------------------------------------------------------------------- Pulmonary artery:   Systolic pressure was within the normal range.   ------------------------------------------------------------------- Right atrium:  The atrium was normal in size.  ------------------------------------------------------------------- Pericardium:  There was no pericardial effusion.  EKG:  EKG is not ordered today.    Recent Labs: 06/21/2017: ALT 17 07/09/2017: BUN 16; Creatinine, Ser 1.60; Potassium 5.0; Sodium 137  Recent Lipid Panel    Component Value Date/Time   CHOL 115 (L) 01/11/2015 0820   TRIG 119 01/11/2015 0820   HDL 26 (L) 01/11/2015 0820   CHOLHDL 4.4 01/11/2015 0820   VLDL 24 01/11/2015 0820   LDLCALC 65 01/11/2015 0820   LDLDIRECT 122.5 12/02/2013 0922    Physical Exam:     VS:  BP (!) 144/78   Pulse 69   Ht 5\' 5"  (1.651 m)   Wt 178 lb 6.4 oz (80.9 kg)   SpO2 96%   BMI 29.69 kg/m     Wt Readings from Last 3 Encounters:  09/18/17 178 lb 6.4 oz (80.9 kg)  06/21/17 177 lb 12.8 oz (80.6 kg)  05/30/17 179 lb 6.4 oz (81.4 kg)     GEN:  Well nourished, well developed in no acute distress HEENT: Normal NECK: No JVD; No carotid bruits LYMPHATICS: No lymphadenopathy CARDIAC: RRR, no murmurs, rubs, gallops RESPIRATORY:  Clear to auscultation without rales, wheezing or rhonchi  ABDOMEN: Soft, non-tender, non-distended MUSCULOSKELETAL:  Trace bilateral pretibial edema; No deformity  SKIN: Warm and dry NEUROLOGIC:  Alert and oriented x 3 PSYCHIATRIC:  Normal affect   ASSESSMENT:  1. Coronary artery disease involving native coronary artery of native heart without angina pectoris   2. Chronic diastolic heart failure ([place])   3. Essential hypertension   4. Mixed hyperlipidemia    PLAN:    In order of problems listed above:  1. The patient has stable symptoms of angina.  He will continue on his current medical program which includes clopidogrel, antianginal therapy with amlodipine, isosorbide, and metoprolol.  No changes are recommended today. 2. Volume status appears stable, but recommend that he resume furosemide 20 mg daily.  He had significant improvement with this before. 3. Blood pressure is controlled on his current regimen which is reviewed today. 4. He is treated with a high intensity statin drug.  Lipids are followed by his primary care physician.   Medication Adjustments/Labs and Tests Ordered: Current medicines are reviewed at length with the patient today.  Concerns regarding medicines are outlined above.  Orders Placed This Encounter  Procedures  . Comprehensive metabolic panel  . Lipid panel   Meds ordered this encounter  Medications  . clopidogrel (PLAVIX) 75 MG tablet    Sig: Take 1 tablet (75 mg total) by mouth at bedtime.     Dispense:  90 tablet    Refill:  3  . furosemide (LASIX) 20 MG tablet    Sig: Take 1 tablet (20 mg total) by mouth daily.    Dispense:  90 tablet    Refill:  3    Patient Instructions  Medication Instructions:  Your Lasix and Plavix have been sent to your pharmacy.  Labwork: You will have fasting labs drawn when you return in March.   Testing/Procedures: None  Follow-Up: You have an appointment with Dr. Antionette Char assistant, Nicki Reaper, on 03/19/2018 at 8:15AM.  Any Other Special Instructions Will Be Listed Below (If Applicable).     If you need a refill on your cardiac medications before your next appointment, please call your pharmacy.      Signed, Sherren Mocha, MD  09/18/2017 5:10 PM    [facility]

## 2017-09-18 NOTE — Patient Instructions (Addendum)
Medication Instructions:  Your Lasix and Plavix have been sent to your pharmacy.  Labwork: You will have fasting labs drawn when you return in March.   Testing/Procedures: None  Follow-Up: You have an appointment with Dr. Antionette Char assistant, Nicki Reaper, on 03/19/2018 at 8:15AM.  Any Other Special Instructions Will Be Listed Below (If Applicable).     If you need a refill on your cardiac medications before your next appointment, please call your pharmacy.

## 2017-11-11 ENCOUNTER — Encounter: Payer: Self-pay | Admitting: Podiatry

## 2017-11-11 ENCOUNTER — Ambulatory Visit (INDEPENDENT_AMBULATORY_CARE_PROVIDER_SITE_OTHER): Payer: Medicare Other | Admitting: Podiatry

## 2017-11-11 ENCOUNTER — Ambulatory Visit (INDEPENDENT_AMBULATORY_CARE_PROVIDER_SITE_OTHER): Payer: Medicare Other

## 2017-11-11 VITALS — BP 122/84 | HR 72 | Resp 16 | Ht 65.0 in | Wt 176.0 lb

## 2017-11-11 DIAGNOSIS — M79674 Pain in right toe(s): Secondary | ICD-10-CM | POA: Diagnosis not present

## 2017-11-11 DIAGNOSIS — M109 Gout, unspecified: Secondary | ICD-10-CM

## 2017-11-11 DIAGNOSIS — M10371 Gout due to renal impairment, right ankle and foot: Secondary | ICD-10-CM | POA: Diagnosis not present

## 2017-11-11 MED ORDER — COLCHICINE 0.6 MG PO TABS: 0.6000 mg | ORAL_TABLET | Freq: Every day | ORAL | 0 refills | Status: DC

## 2017-11-11 NOTE — Progress Notes (Signed)
   Subjective:    Patient ID: [name], male    DOB: 02-07-1945, 72 y.o.   MRN: [id number]  HPI    Review of Systems  Musculoskeletal: Positive for arthralgias and myalgias.  All other systems reviewed and are negative.      Objective:   Physical Exam        Assessment & Plan:

## 2017-11-11 NOTE — Patient Instructions (Signed)

## 2017-11-11 NOTE — Progress Notes (Signed)
Subjective:  Patient ID: [name], male    DOB: 09/25/1945,  MRN: [id number]  Chief Complaint  Patient presents with  . Foot Pain    Pain and swelling: R hallux x 1 mo; 10/10 sharp intermmitent pain -no injury Tx: advil Pt. states." standing makes it worst, and in the past 2 weeks the pain has been at its worst."    72 y.o. male presents with the above complaint. History as above. States it's not as red recently but has been red swollen and incredibly painful.  Review of Systems: Negative except as noted in the HPI. Denies N/V/F/Ch.  Past Medical History:  Diagnosis Date  . Anemia   . Blood transfusion   . CAD (coronary artery disease)    a. CABG 1993;  b. PCI of SVG to OM Jan 2012 (Drug-eluting stent x 2);  c. 09/2012 Cath/PCI: LM nl, LAD sev dzs, LCX 100, RCA patent stent, VG->AM 100, LIMA->LAD nl, VG->OM 95p ISR (PTCA only), patent distal stent. // d. LHC 3/18: pLAD 100, oRI 100, pLCx 100, Lat OM 95, RCA stent ok, S-PDA 100, L-LAD ok, S-OM1 stent 80 ISR >> PCI: POBA to S-OM1; DES to native Lat OM1  . CKD (chronic kidney disease), stage III ([place])   . Degeneration of lumbar or lumbosacral intervertebral disc   . GERD (gastroesophageal reflux disease)   . H/O hiatal hernia   . History of echocardiogram    Echo 3/18: Mild LVH, EF 60-65, Gr 2 DD  . History of nephrolithiasis   . Hyperlipidemia   . Hyperplastic colon polyp   . Hypertension   . Myocardial infarction (HCC)    hx of five   . Stricture and stenosis of esophagus     Current Outpatient Medications:  .  amLODipine (NORVASC) 5 MG tablet, ONE TABLET EVERY MORNING, Disp: 90 tablet, Rfl: 3 .  clopidogrel (PLAVIX) 75 MG tablet, Take 1 tablet (75 mg total) by mouth at bedtime., Disp: 90 tablet, Rfl: 3 .  ferrous sulfate 325 (65 FE) MG tablet, Take 325 mg by mouth daily with breakfast., Disp: , Rfl:  .  furosemide (LASIX) 20 MG tablet, Take 1 tablet (20 mg total) by mouth daily., Disp: 90 tablet, Rfl: 3 .  isosorbide  mononitrate (IMDUR) 60 MG 24 hr tablet, TAKE ONE AND ONE-HALF TABLETS DAILY, Disp: 135 tablet, Rfl: 3 .  metoprolol tartrate (LOPRESSOR) 50 MG tablet, TAKE ONE TABLET TWICE DAILY, Disp: 180 tablet, Rfl: 3 .  nitroGLYCERIN (NITROSTAT) 0.4 MG SL tablet, Place 1 tablet (0.4 mg total) under the tongue every 5 (five) minutes as needed for chest pain. Up to 3 doses, Disp: 25 tablet, Rfl: 1 .  pantoprazole (PROTONIX) 40 MG tablet, TAKE ONE (1) TABLET EACH DAY, Disp: 90 tablet, Rfl: 1 .  rosuvastatin (CRESTOR) 20 MG tablet, TAKE ONE (1) TABLET EACH DAY, Disp: 90 tablet, Rfl: 3 .  colchicine 0.6 MG tablet, Take 1 tablet (0.6 mg total) by mouth daily., Disp: 30 tablet, Rfl: 0 .  potassium chloride (K-DUR) 10 MEQ tablet, Take 1 tablet (10 mEq total) by mouth daily., Disp: 90 tablet, Rfl: 3  Current Facility-Administered Medications:  .  triamcinolone acetonide (KENALOG) 10 MG/ML injection 10 mg, 10 mg, Other, Once, Sikora, Richard, DPM .  triamcinolone acetonide (KENALOG) 10 MG/ML injection 10 mg, 10 mg, Other, Once, Harriet Masson, DPM  Social History   Tobacco Use  Smoking Status Former Smoker  . Last attempt to quit: 01/06/1987  . Years since quitting: 30.8  Smokeless Tobacco Former User    No Known Allergies Objective:   Vitals:   11/11/17 0912  BP: 122/84  Pulse: 72  Resp: 16   Body mass index is 29.29 kg/m. Constitutional Well developed. Well nourished.  Vascular Dorsalis pedis pulses palpable bilaterally. Posterior tibial pulses palpable bilaterally. Capillary refill normal to all digits.  No cyanosis or clubbing noted. Pedal hair growth normal.  Neurologic Normal speech. Oriented to person, place, and time. Epicritic sensation to light touch grossly present bilaterally.  Dermatologic Nails well groomed and normal in appearance. No open wounds. No skin lesions.  Orthopedic: Normal joint ROM without pain or crepitus bilaterally. No visible deformities. No bony tenderness.    Radiographs: Taken and reviewed. Questionable marginal medial erosion 1st MPJ Assessment:   1. Gouty arthritis   2. Acute gout due to renal impairment involving toe of right foot   3. Pain in toe of right foot    Plan:  Patient was evaluated and treated and all questions answered.  Gouty Arthritis R 1st MPJ -XR as above -Educated on gout and dietary restrictions -Check Uric Acid -Rx Colchicine. Educated on side effects and use of medication. -Injection R 1st MPJ as below.  Procedure: Joint Injection Location: Right 1st MPJ joint Skin Prep: Alcohol. Injectate: 0.5 cc 1% lidocaine plain, 0.5 cc dexamethasone phosphate. Disposition: Patient tolerated procedure well. Injection site dressed with a band-aid.  Return in about 3 weeks (around 12/02/2017) for Gout F/u.

## 2017-11-12 LAB — URIC ACID: URIC ACID: 3.6 mg/dL — AB (ref 3.7–8.6)

## 2017-11-13 ENCOUNTER — Other Ambulatory Visit: Payer: Self-pay | Admitting: Podiatry

## 2017-11-13 DIAGNOSIS — M79674 Pain in right toe(s): Secondary | ICD-10-CM

## 2017-11-13 DIAGNOSIS — M109 Gout, unspecified: Secondary | ICD-10-CM

## 2017-12-02 ENCOUNTER — Ambulatory Visit (INDEPENDENT_AMBULATORY_CARE_PROVIDER_SITE_OTHER): Payer: Medicare Other | Admitting: Podiatry

## 2017-12-02 DIAGNOSIS — M10371 Gout due to renal impairment, right ankle and foot: Secondary | ICD-10-CM

## 2017-12-02 DIAGNOSIS — M722 Plantar fascial fibromatosis: Secondary | ICD-10-CM

## 2017-12-02 DIAGNOSIS — M109 Gout, unspecified: Secondary | ICD-10-CM | POA: Diagnosis not present

## 2017-12-02 NOTE — Progress Notes (Signed)
Subjective:  Patient ID: [name], male    DOB: Apr 02, 1945,  MRN: [id number]  Chief Complaint  Patient presents with  . Gout    F/U R 1st MPJ gout Pt.s tates," it's not bunring/stining as it was but it still hurts here and there; 5/10." Tx: colchincine   72 y.o. male presents with the above complaint. History as above. Not having pain in the big toe joint but having pain in the right heel on the outside.  Review of Systems: Negative except as noted in the HPI. Denies N/V/F/Ch.  Past Medical History:  Diagnosis Date  . Anemia   . Blood transfusion   . CAD (coronary artery disease)    a. CABG 1993;  b. PCI of SVG to OM Jan 2012 (Drug-eluting stent x 2);  c. 09/2012 Cath/PCI: LM nl, LAD sev dzs, LCX 100, RCA patent stent, VG->AM 100, LIMA->LAD nl, VG->OM 95p ISR (PTCA only), patent distal stent. // d. LHC 3/18: pLAD 100, oRI 100, pLCx 100, Lat OM 95, RCA stent ok, S-PDA 100, L-LAD ok, S-OM1 stent 80 ISR >> PCI: POBA to S-OM1; DES to native Lat OM1  . CKD (chronic kidney disease), stage III ([place])   . Degeneration of lumbar or lumbosacral intervertebral disc   . GERD (gastroesophageal reflux disease)   . H/O hiatal hernia   . History of echocardiogram    Echo 3/18: Mild LVH, EF 60-65, Gr 2 DD  . History of nephrolithiasis   . Hyperlipidemia   . Hyperplastic colon polyp   . Hypertension   . Myocardial infarction (HCC)    hx of five   . Stricture and stenosis of esophagus     Current Outpatient Medications:  .  amLODipine (NORVASC) 5 MG tablet, ONE TABLET EVERY MORNING, Disp: 90 tablet, Rfl: 3 .  clopidogrel (PLAVIX) 75 MG tablet, Take 1 tablet (75 mg total) by mouth at bedtime., Disp: 90 tablet, Rfl: 3 .  colchicine 0.6 MG tablet, Take 1 tablet (0.6 mg total) by mouth daily., Disp: 30 tablet, Rfl: 0 .  ferrous sulfate 325 (65 FE) MG tablet, Take 325 mg by mouth daily with breakfast., Disp: , Rfl:  .  furosemide (LASIX) 20 MG tablet, Take 1 tablet (20 mg total) by mouth daily.,  Disp: 90 tablet, Rfl: 3 .  isosorbide mononitrate (IMDUR) 60 MG 24 hr tablet, TAKE ONE AND ONE-HALF TABLETS DAILY, Disp: 135 tablet, Rfl: 3 .  metoprolol tartrate (LOPRESSOR) 50 MG tablet, TAKE ONE TABLET TWICE DAILY, Disp: 180 tablet, Rfl: 3 .  nitroGLYCERIN (NITROSTAT) 0.4 MG SL tablet, Place 1 tablet (0.4 mg total) under the tongue every 5 (five) minutes as needed for chest pain. Up to 3 doses, Disp: 25 tablet, Rfl: 1 .  pantoprazole (PROTONIX) 40 MG tablet, TAKE ONE (1) TABLET EACH DAY, Disp: 90 tablet, Rfl: 1 .  rosuvastatin (CRESTOR) 20 MG tablet, TAKE ONE (1) TABLET EACH DAY, Disp: 90 tablet, Rfl: 3 .  potassium chloride (K-DUR) 10 MEQ tablet, Take 1 tablet (10 mEq total) by mouth daily., Disp: 90 tablet, Rfl: 3  Current Facility-Administered Medications:  .  triamcinolone acetonide (KENALOG) 10 MG/ML injection 10 mg, 10 mg, Other, Once, Sikora, Richard, DPM .  triamcinolone acetonide (KENALOG) 10 MG/ML injection 10 mg, 10 mg, Other, Once, Harriet Masson, DPM  Social History   Tobacco Use  Smoking Status Former Smoker  . Last attempt to quit: 01/06/1987  . Years since quitting: 30.9  Smokeless Tobacco Former Systems developer    No Known  Allergies Objective:   There were no vitals filed for this visit. There is no height or weight on file to calculate BMI. Constitutional Well developed. Well nourished.  Vascular Dorsalis pedis pulses palpable bilaterally. Posterior tibial pulses palpable bilaterally. Capillary refill normal to all digits.  No cyanosis or clubbing noted. Pedal hair growth normal.  Neurologic Normal speech. Oriented to person, place, and time. Epicritic sensation to light touch grossly present bilaterally.  Dermatologic Nails well groomed and normal in appearance. No open wounds. No skin lesions.  Orthopedic: Normal joint ROM without pain or crepitus bilaterally. No visible deformities. POP R lateral calc tuber. No pain on ROM 1st MPJ R    Radiographs: None  today Assessment:   1. Gouty arthritis   2. Acute gout due to renal impairment involving toe of right foot   3. Plantar fasciitis    Plan:  Patient was evaluated and treated and all questions answered.  Gouty Arthritis R 1st MPJ -Much improved. -UA normal. -Continue colchicine PRN  Plantar Fasciitis -Injection as below.  Procedure: Injection Tendon/Ligament Consent: Verbal consent obtained. Location: Right plantar fascia at the glabrous junction; lateral approach. Skin Prep: Alcohol. Injectate: 1 cc 0.5% marcaine plain, 1 cc dexamethasone phosphate, 0.5 cc kenalog 10. Disposition: Patient tolerated procedure well. Injection site dressed with a band-aid.  No follow-ups on file.

## 2018-02-19 ENCOUNTER — Other Ambulatory Visit: Payer: Self-pay

## 2018-02-19 MED ORDER — NITROGLYCERIN 0.4 MG SL SUBL: 0.4000 mg | SUBLINGUAL_TABLET | SUBLINGUAL | 1 refills | Status: DC | PRN

## 2018-02-19 MED ORDER — PANTOPRAZOLE SODIUM 40 MG PO TBEC: DELAYED_RELEASE_TABLET | ORAL | 1 refills | Status: DC

## 2018-03-18 ENCOUNTER — Telehealth: Payer: Self-pay | Admitting: Physician Assistant

## 2018-03-18 ENCOUNTER — Telehealth: Payer: Self-pay | Admitting: Internal Medicine

## 2018-03-18 NOTE — Telephone Encounter (Signed)
Unable to leave msg at either number REcomm he resched this routine follow up for later in spring unless he has unstable symptoms

## 2018-03-18 NOTE — Telephone Encounter (Signed)
Pt;s wife called   I explained previous msg of rescheduling    SHe understands   He is doing OK Will postpone clinic and lab check for tomorrow Office will call to resched in late spring

## 2018-03-18 NOTE — Telephone Encounter (Addendum)
Patient called regarding appointment tomorrow due to current COVID-19 pandemic.  I spoke with the patient's wife Joycelyn Schmid).  DPR on file.  The patient is doing well without chest pain, shortness of breath, swelling.  She feels that he has been doing well recently.  PLAN:   His appointment for 03/19/18 has been canceled.  Please reschedule in 3 mos (Dr. Burt Knack or Richardson Dopp) with labs at that visit (CMET, Lipids).  Richardson Dopp, PA-C    03/18/2018 2:14 PM

## 2018-03-19 ENCOUNTER — Other Ambulatory Visit: Payer: Medicare Other

## 2018-03-19 ENCOUNTER — Ambulatory Visit: Payer: Medicare Other | Admitting: Physician Assistant

## 2018-03-25 ENCOUNTER — Other Ambulatory Visit: Payer: Medicare Other

## 2018-06-23 ENCOUNTER — Telehealth: Payer: Self-pay | Admitting: *Deleted

## 2018-06-23 NOTE — Telephone Encounter (Signed)
    COVID-19 Pre-Screening Questions:  . In the past 7 to 10 days have you had a cough,  shortness of breath, headache, congestion, fever (100 or greater) body aches, chills, sore throat, or sudden loss of taste or sense of smell? . Have you been around anyone with known Covid 19. . Have you been around anyone who is awaiting Covid 19 test results in the past 7 to 10 days? . Have you been around anyone who has been exposed to Covid 19, or has mentioned symptoms of Covid 19 within the past 7 to 10 days?  If you have any concerns/questions about symptoms patients report during screening (either on the phone or at threshold). Contact the provider seeing the patient or DOD for further guidance.  If neither are available contact a member of the leadership team.          Contacted patient via telephone talk to wife answered NO to all Covid 19 questions. Has a mask. KB

## 2018-06-24 NOTE — Progress Notes (Signed)
Cardiology Office Note:    Date:  06/25/2018   ID:  [name], DOB Sep 15, 1945, MRN [id number]  PCP:  Allean Found, MD  Cardiologist:  Sherren Mocha, MD   Electrophysiologist:  None   Referring MD: Allean Found, MD   Chief Complaint  Patient presents with  . Follow-up    CAD     History of Present Illness:    [name] is a 73 y.o. male with CAD with angina, status post CABG and subsequent PCI, HTN, HL, CKD, GERD.  He had bypass in 1993. He has subsequently undergone PCI to the SVG-OM in 2009 and again in 2012, 2014 and 2018.  The native OM subbranch was treated with a DES in 2018 as well.  He was last seen by Dr. Burt Rice in September 2019.   [name] returns for follow-up.  He is here alone.  He has been doing well.  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  He has not really had any anginal symptoms since he was last seen.  He has not had shortness of breath, syncope, lower extremity swelling.  He did have some swelling in his legs related to gout that resolved with colchicine.  Prior CV studies:   The following studies were reviewed today:  Echo 03/20/16 Mild LVH, EF 60-65, Gr 2 DD  LHC 03/19/16 LAD proximal 100  RI ostial 100  LCx proximal 100, lateral OM1 95 RCA stent patent with 30-40 ISR SVG-RPDA 100 LIMA-LAD patent SVG-OM1 proximal stent 80 ISR PCI: POBA to the SVG-OM1;Synergy Des 2.25x16 to the native lateral OM1  Past Medical History:  Diagnosis Date  . Anemia   . Blood transfusion   . CAD (coronary artery disease)    a. CABG 1993;  b. PCI of SVG to OM Jan 2012 (Drug-eluting stent x 2);  c. 09/2012 Cath/PCI: LM nl, LAD sev dzs, LCX 100, RCA patent stent, VG->AM 100, LIMA->LAD nl, VG->OM 95p ISR (PTCA only), patent distal stent. // d. LHC 3/18: pLAD 100, oRI 100, pLCx 100, Lat OM 95, RCA stent ok, S-PDA 100, L-LAD ok, S-OM1 stent 80 ISR >> PCI: POBA to S-OM1; DES to native Lat OM1  . CKD (chronic kidney disease), stage  III ([name])   . Degeneration of lumbar or lumbosacral intervertebral disc   . GERD (gastroesophageal reflux disease)   . H/O hiatal hernia   . History of echocardiogram    Echo 3/18: Mild LVH, EF 60-65, Gr 2 DD  . History of nephrolithiasis   . Hyperlipidemia   . Hyperplastic colon polyp   . Hypertension   . Myocardial infarction (HCC)    hx of five   . Stricture and stenosis of esophagus    Surgical Hx: The patient  has a past surgical history that includes Coronary artery bypass graft (1993); Lithotripsy; Angioplasty; Colonoscopy; Lumbar back; Back surgery; Cardiac catheterization; Nephrolithotomy (Right, 09/17/2013); Holmium laser application (Right, 1/60/7371); left heart catheterization with coronary/graft angiogram (N/A, 09/23/2012); Percutaneous coronary intervention-balloon only (09/23/2012); LEFT HEART CATH AND CORS/GRAFTS ANGIOGRAPHY (N/A, 03/19/2016); and CORONARY STENT INTERVENTION (N/A, 03/19/2016).   Current Medications: Current Meds  Medication Sig  . amLODipine (NORVASC) 5 MG tablet ONE TABLET EVERY MORNING  . clopidogrel (PLAVIX) 75 MG tablet Take 1 tablet (75 mg total) by mouth at bedtime.  . ferrous sulfate 325 (65 FE) MG tablet Take 325 mg by mouth as needed.   . furosemide (LASIX) 20 MG tablet Take 1 tablet (20 mg total)  by mouth daily. (Patient taking differently: Take 20 mg by mouth as needed. )  . isosorbide mononitrate (IMDUR) 60 MG 24 hr tablet TAKE ONE AND ONE-HALF TABLETS DAILY  . metoprolol tartrate (LOPRESSOR) 50 MG tablet TAKE ONE TABLET TWICE DAILY  . nitroGLYCERIN (NITROSTAT) 0.4 MG SL tablet Place 1 tablet (0.4 mg total) under the tongue every 5 (five) minutes as needed for chest pain. Up to 3 doses  . pantoprazole (PROTONIX) 40 MG tablet TAKE ONE (1) TABLET EACH DAY  . rosuvastatin (CRESTOR) 20 MG tablet TAKE ONE (1) TABLET EACH DAY   Current Facility-Administered Medications for the 06/25/18 encounter (Office Visit) with [name] T, PA-C  Medication  .  triamcinolone acetonide (KENALOG) 10 MG/ML injection 10 mg  . triamcinolone acetonide (KENALOG) 10 MG/ML injection 10 mg     Allergies:   Patient has no known allergies.   Social History   Tobacco Use  . Smoking status: Former Smoker    Quit date: 01/06/1987    Years since quitting: 31.4  . Smokeless tobacco: Former Network engineer Use Topics  . Alcohol use: No  . Drug use: No     Family Hx: The patient's family history includes CAD (age of onset: 54) in some other family members; Coronary artery disease (age of onset: 88) in his mother; Kidney cancer in his mother; Lung cancer in his brother and sister. There is no history of Colon cancer.  ROS:   Please see the history of present illness.    ROS All other systems reviewed and are negative.   EKGs/Labs/Other Test Reviewed:    EKG:  EKG is  ordered today.  The ekg ordered today demonstrates sinus bradycardia, heart rate 55, normal axis, inferior Q waves, QTC 405, similar to prior tracing  Recent Labs: 07/09/2017: BUN 16; Creatinine, Ser 1.60; Potassium 5.0; Sodium 137   Recent Lipid Panel Lab Results  Component Value Date/Time   CHOL 115 (L) 01/11/2015 08:20 AM   TRIG 119 01/11/2015 08:20 AM   HDL 26 (L) 01/11/2015 08:20 AM   CHOLHDL 4.4 01/11/2015 08:20 AM   LDLCALC 65 01/11/2015 08:20 AM   LDLDIRECT 122.5 12/02/2013 09:22 AM    Physical Exam:    VS:  BP 140/90   Pulse (!) 55   Ht 5\' 5"  (1.651 m)   Wt 165 lb 12.8 oz (75.2 kg)   BMI 27.59 kg/m     Wt Readings from Last 3 Encounters:  06/25/18 165 lb 12.8 oz (75.2 kg)  11/11/17 176 lb (79.8 kg)  09/18/17 178 lb 6.4 oz (80.9 kg)     Physical Exam  Constitutional: He is oriented to person, place, and time. He appears well-developed and well-nourished. No distress.  HENT:  Head: Normocephalic and atraumatic.  Eyes: No scleral icterus.  Neck: No JVD present. Carotid bruit is not present. No thyromegaly present.  Cardiovascular: Normal rate, regular rhythm and  normal heart sounds.  No murmur heard. Pulmonary/Chest: Effort normal and breath sounds normal. He has no rales.  Abdominal: Soft. There is no hepatomegaly.  Musculoskeletal:        General: No edema.  Lymphadenopathy:    He has no cervical adenopathy.  Neurological: He is alert and oriented to person, place, and time.  Skin: Skin is warm and dry.  Psychiatric: He has a normal mood and affect.    ASSESSMENT & PLAN:    1. Coronary artery disease involving native coronary artery of native heart with angina pectoris ([place]) S/p  CABG in 1993 and multiple PCI procedures since. Most recent PCI 3/18 with angioplasty to the vein graft to the first obtuse marginal and DES to the native lateral OM1.  He is doing well without anginal symptoms on his current regimen which includes amlodipine, clopidogrel, isosorbide, metoprolol tartrate, rosuvastatin.  Continue current management and follow-up in 6 months.  2. Chronic diastolic heart failure (HCC) EF 60-65 with moderate diastolic dysfunction by echo 3/18.  Volume status is stable.  NYHA 2.  3. CKD (chronic kidney disease), stage III ([place]) Stable by most recent lab work.  Follow-up CMET today.  4. Essential hypertension Blood pressure somewhat above target.  I have asked him to continue to monitor this over the next week and send me some readings.  If his pressure continues to run above target (>130/80), increase amlodipine to 10 mg daily.  5. Mixed hyperlipidemia Continue high-dose statin therapy.  Obtain CMP, lipids today.   Dispo:  Return in about 6 months (around 12/25/2018) for Routine Follow Up, w/ Dr. Burt Rice, or Douglas Dopp, PA-C.   Medication Adjustments/Labs and Tests Ordered: Current medicines are reviewed at length with the patient today.  Concerns regarding medicines are outlined above.  Tests Ordered: Orders Placed This Encounter  Procedures  . Lipid panel  . Comprehensive metabolic panel  . EKG 12-Lead   Medication Changes:  No orders of the defined types were placed in this encounter.   Signed, Douglas Dopp, PA-C  06/25/2018 9:54 AM    [facility] [address], [place], [place]  [address] Phone: [phone]; Fax: [phone]

## 2018-06-25 ENCOUNTER — Encounter (INDEPENDENT_AMBULATORY_CARE_PROVIDER_SITE_OTHER): Payer: Self-pay

## 2018-06-25 ENCOUNTER — Encounter: Payer: Self-pay | Admitting: Physician Assistant

## 2018-06-25 ENCOUNTER — Other Ambulatory Visit: Payer: Medicare Other | Admitting: *Deleted

## 2018-06-25 ENCOUNTER — Other Ambulatory Visit: Payer: Self-pay

## 2018-06-25 ENCOUNTER — Ambulatory Visit (INDEPENDENT_AMBULATORY_CARE_PROVIDER_SITE_OTHER): Payer: Medicare Other | Admitting: Physician Assistant

## 2018-06-25 VITALS — BP 140/90 | HR 55 | Ht 65.0 in | Wt 165.8 lb

## 2018-06-25 DIAGNOSIS — I5032 Chronic diastolic (congestive) heart failure: Secondary | ICD-10-CM

## 2018-06-25 DIAGNOSIS — I25119 Atherosclerotic heart disease of native coronary artery with unspecified angina pectoris: Secondary | ICD-10-CM | POA: Diagnosis not present

## 2018-06-25 DIAGNOSIS — I10 Essential (primary) hypertension: Secondary | ICD-10-CM

## 2018-06-25 DIAGNOSIS — I2510 Atherosclerotic heart disease of native coronary artery without angina pectoris: Secondary | ICD-10-CM | POA: Diagnosis not present

## 2018-06-25 DIAGNOSIS — E782 Mixed hyperlipidemia: Secondary | ICD-10-CM

## 2018-06-25 DIAGNOSIS — I129 Hypertensive chronic kidney disease with stage 1 through stage 4 chronic kidney disease, or unspecified chronic kidney disease: Secondary | ICD-10-CM

## 2018-06-25 DIAGNOSIS — N183 Chronic kidney disease, stage 3 unspecified: Secondary | ICD-10-CM

## 2018-06-25 LAB — COMPREHENSIVE METABOLIC PANEL
ALT: 15 IU/L (ref 0–44)
AST: 18 IU/L (ref 0–40)
Albumin/Globulin Ratio: 1.8 (ref 1.2–2.2)
Albumin: 4.5 g/dL (ref 3.7–4.7)
Alkaline Phosphatase: 91 IU/L (ref 39–117)
BUN/Creatinine Ratio: 8 — ABNORMAL LOW (ref 10–24)
BUN: 12 mg/dL (ref 8–27)
Bilirubin Total: 0.4 mg/dL (ref 0.0–1.2)
CO2: 23 mmol/L (ref 20–29)
Calcium: 9.4 mg/dL (ref 8.6–10.2)
Chloride: 98 mmol/L (ref 96–106)
Creatinine, Ser: 1.45 mg/dL — ABNORMAL HIGH (ref 0.76–1.27)
GFR calc Af Amer: 56 mL/min/{1.73_m2} — ABNORMAL LOW (ref >59)
GFR calc non Af Amer: 48 mL/min/{1.73_m2} — ABNORMAL LOW (ref >59)
Globulin, Total: 2.5 g/dL (ref 1.5–4.5)
Glucose: 344 mg/dL — ABNORMAL HIGH (ref 65–99)
Potassium: 4.4 mmol/L (ref 3.5–5.2)
Sodium: 135 mmol/L (ref 134–144)
Total Protein: 7 g/dL (ref 6.0–8.5)

## 2018-06-25 LAB — LIPID PANEL
Chol/HDL Ratio: 5.1 ratio — ABNORMAL HIGH (ref 0.0–5.0)
Cholesterol, Total: 148 mg/dL (ref 100–199)
HDL: 29 mg/dL — ABNORMAL LOW (ref >39)
LDL Calculated: 73 mg/dL (ref 0–99)
Triglycerides: 232 mg/dL — ABNORMAL HIGH (ref 0–149)
VLDL Cholesterol Cal: 46 mg/dL — ABNORMAL HIGH (ref 5–40)

## 2018-06-25 NOTE — Patient Instructions (Signed)
Medication Instructions:  Your physician recommends that you continue on your current medications as directed. Please refer to the Current Medication list given to you today.   If you need a refill on your cardiac medications before your next appointment, please call your pharmacy.   Lab work: Lipids and Cmet  If you have labs (blood work) drawn today and your tests are completely normal, you will receive your results only by: Marland Kitchen MyChart Message (if you have MyChart) OR . A paper copy in the mail If you have any lab test that is abnormal or we need to change your treatment, we will call you to review the results.  Testing/Procedures: NONE ORDERED  TODAY   Follow-Up: At [facility], you and your health needs are our priority.  As part of our continuing mission to provide you with exceptional heart care, we have created designated Provider Care Teams.  These Care Teams include your primary Cardiologist (physician) and Advanced Practice Providers (APPs -  Physician Assistants and Nurse Practitioners) who all work together to provide you with the care you need, when you need it. You will need a follow up appointment in:  6 months.  Please call our office 2 months in advance to schedule this appointment.  You may see Sherren Mocha, MD or one of the following Advanced Practice Providers on your designated Care Team: Richardson Dopp, PA-C [place], [place] . Daune Perch, NP  Any Other Special Instructions Will Be Listed Below (If Applicable).  Keep a daily log of blood pressure for a week and call us back

## 2018-07-17 ENCOUNTER — Other Ambulatory Visit: Payer: Self-pay | Admitting: Physician Assistant

## 2018-07-17 DIAGNOSIS — E785 Hyperlipidemia, unspecified: Secondary | ICD-10-CM

## 2018-07-17 DIAGNOSIS — I25110 Atherosclerotic heart disease of native coronary artery with unstable angina pectoris: Secondary | ICD-10-CM

## 2018-07-17 DIAGNOSIS — I10 Essential (primary) hypertension: Secondary | ICD-10-CM

## 2018-07-23 ENCOUNTER — Other Ambulatory Visit: Payer: Self-pay | Admitting: Cardiovascular Disease

## 2018-08-13 ENCOUNTER — Other Ambulatory Visit: Payer: Self-pay | Admitting: Cardiovascular Disease

## 2018-12-21 NOTE — Progress Notes (Signed)
Cardiology Office Note:    Date:  12/22/2018   ID:  [name], DOB [birth year], MRN [id number]  PCP:  Allean Found, MD  Cardiologist:  Sherren Mocha, MD  Electrophysiologist:  None   Referring MD: Allean Found, MD   Chief Complaint  Patient presents with  . Follow-up    CAD    History of Present Illness:    [name] is a 73 y.o. male with:   Coronary artery disease w/ angina  S/p CABG in 1993  S/p PCI to S-OM in 2009 and again in 2012, 2014, 2018  S/p POBA to S-OM1 and DES to OM in 3893  Chronic diastolic CHF  Chronic kidney disease   Hypertension   Hyperlipidemia   [name] was last seen in 06/2018.    He returns for follow-up.  He is here alone.  Since last seen, he has done well.  He has only had to take nitroglycerin once.  He does note some cold intolerance when he drinks iced tea in the wintertime.  This also causes some chest discomfort.  This is a chronic symptom ongoing for years without significant change.  He has not had orthopnea, leg swelling, syncope.    Prior CV studies:   The following studies were reviewed today:   Echo 03/20/16 Mild LVH, EF 60-65, Gr 2 DD  LHC 03/19/16 LAD proximal 100  RI ostial 100  LCx proximal 100, lateral OM1 95 RCA stent patent with 30-40 ISR SVG-RPDA 100 LIMA-LAD patent SVG-OM1 proximal stent 80 ISR PCI: POBA to the SVG-OM1;Synergy Des 2.25x16 to the native lateral OM1  Past Medical History:  Diagnosis Date  . Anemia   . Blood transfusion   . CAD (coronary artery disease)    a. CABG 1993;  b. PCI of SVG to OM Jan 2012 (Drug-eluting stent x 2);  c. 09/2012 Cath/PCI: LM nl, LAD sev dzs, LCX 100, RCA patent stent, VG->AM 100, LIMA->LAD nl, VG->OM 95p ISR (PTCA only), patent distal stent. // d. LHC 3/18: pLAD 100, oRI 100, pLCx 100, Lat OM 95, RCA stent ok, S-PDA 100, L-LAD ok, S-OM1 stent 80 ISR >> PCI: POBA to S-OM1; DES to native Lat OM1  . CKD (chronic kidney disease), stage III   . Degeneration of  lumbar or lumbosacral intervertebral disc   . GERD (gastroesophageal reflux disease)   . H/O hiatal hernia   . History of echocardiogram    Echo 3/18: Mild LVH, EF 60-65, Gr 2 DD  . History of nephrolithiasis   . Hyperlipidemia   . Hyperplastic colon polyp   . Hypertension   . Myocardial infarction (HCC)    hx of five   . Stricture and stenosis of esophagus    Surgical Hx: The patient  has a past surgical history that includes Coronary artery bypass graft (1993); Lithotripsy; Angioplasty; Colonoscopy; Lumbar back; Back surgery; Cardiac catheterization; Nephrolithotomy (Right, 09/17/2013); Holmium laser application (Right, 7/34/2876); left heart catheterization with coronary/graft angiogram (N/A, 09/23/2012); Percutaneous coronary intervention-balloon only (09/23/2012); LEFT HEART CATH AND CORS/GRAFTS ANGIOGRAPHY (N/A, 03/19/2016); and CORONARY STENT INTERVENTION (N/A, 03/19/2016).   Current Medications: Current Meds  Medication Sig  . amLODipine (NORVASC) 5 MG tablet ONE TABLET EVERY MORNING  . clopidogrel (PLAVIX) 75 MG tablet ONE TABLET AT BEDTIME  . ferrous sulfate 325 (65 FE) MG tablet Take 325 mg by mouth as needed.   . furosemide (LASIX) 20 MG tablet Take 20 mg by mouth as needed for fluid.  . isosorbide mononitrate (IMDUR) 60 MG  24 hr tablet TAKE ONE AND ONE-HALF TABLETS DAILY  . metoprolol tartrate (LOPRESSOR) 50 MG tablet TAKE ONE TABLET TWICE DAILY  . nitroGLYCERIN (NITROSTAT) 0.4 MG SL tablet Place 1 tablet (0.4 mg total) under the tongue every 5 (five) minutes as needed for chest pain. Up to 3 doses  . pantoprazole (PROTONIX) 40 MG tablet TAKE ONE (1) TABLET EACH DAY  . rosuvastatin (CRESTOR) 20 MG tablet TAKE ONE (1) TABLET EACH DAY   Current Facility-Administered Medications for the 12/22/18 encounter (Office Visit) with Richardson Dopp T, PA-C  Medication  . triamcinolone acetonide (KENALOG) 10 MG/ML injection 10 mg  . triamcinolone acetonide (KENALOG) 10 MG/ML injection 10 mg      Allergies:   Patient has no known allergies.   Social History   Tobacco Use  . Smoking status: Former Smoker    Quit date: 01/06/1987    Years since quitting: 31.9  . Smokeless tobacco: Former Network engineer Use Topics  . Alcohol use: No  . Drug use: No     Family Hx: The patient's family history includes CAD (age of onset: 43) in some other family members; Coronary artery disease (age of onset: 50) in his mother; Kidney cancer in his mother; Lung cancer in his brother and sister. There is no history of Colon cancer.  ROS:   Please see the history of present illness.    ROS All other systems reviewed and are negative.   EKGs/Labs/Other Test Reviewed:    EKG:  EKG is  ordered today.  The ekg ordered today demonstrates normal sinus rhythm, heart rate 68, QTC 414, nonspecific ST-T wave changes, similar to prior tracing  Recent Labs: 06/25/2018: ALT 15; BUN 12; Creatinine, Ser 1.45; Potassium 4.4; Sodium 135   Recent Lipid Panel Lab Results  Component Value Date/Time   CHOL 148 06/25/2018 09:55 AM   TRIG 232 (H) 06/25/2018 09:55 AM   HDL 29 (L) 06/25/2018 09:55 AM   CHOLHDL 5.1 (H) 06/25/2018 09:55 AM   CHOLHDL 4.4 01/11/2015 08:20 AM   LDLCALC 73 06/25/2018 09:55 AM   LDLDIRECT 122.5 12/02/2013 09:22 AM    Physical Exam:    VS:  BP 112/68   Pulse 68   Ht _0  (1.676 m)   Wt 161 lb 1.9 oz (73.1 kg)   SpO2 96%   BMI 26.01 kg/m     Wt Readings from Last 3 Encounters:  12/22/18 161 lb 1.9 oz (73.1 kg)  06/25/18 165 lb 12.8 oz (75.2 kg)  11/11/17 176 lb (79.8 kg)     Physical Exam  Constitutional: He is oriented to person, place, and time. He appears well-developed and well-nourished. No distress.  HENT:  Head: Normocephalic and atraumatic.  Eyes: No scleral icterus.  Neck: No JVD present. Carotid bruit is not present. No thyromegaly present.  Cardiovascular: Normal rate, regular rhythm and normal heart sounds.  No murmur heard. Pulmonary/Chest: Effort  normal and breath sounds normal. He has no rales.  Abdominal: Soft. There is no hepatomegaly.  Musculoskeletal:        General: No edema.  Lymphadenopathy:    He has no cervical adenopathy.  Neurological: He is alert and oriented to person, place, and time.  Skin: Skin is warm and dry.  Psychiatric: He has a normal mood and affect.    ASSESSMENT & PLAN:    1. Coronary artery disease involving native coronary artery of native heart with angina pectoris ([place]) S/pCABG in 1993 and multiple PCI procedures since. Most  recent PCI 3/18 with angioplasty to the vein graft to the first obtuse marginal and DES to the native lateral OM1.  Overall, his anginal pattern is stable on his current regimen which includes amlodipine, clopidogrel, isosorbide, metoprolol tartrate, rosuvastatin.  Continue current therapy.  2. Chronic diastolic heart failure (HCC) EF 60-65 with moderate diastolic dysfunction by echo 3/18.  He takes furosemide as needed.  Volume status is stable.  Continue current therapy.  3. Stage 3b chronic kidney disease Creatinine was stable at 1.45 in 06/2018.    4. Essential hypertension The patient's blood pressure is controlled on his current regimen.  Continue current therapy.   5. Mixed hyperlipidemia Triglycerides in 06/2018 were 232 and LDL was 73.  He is currently on Rosuvastatin 20 mg once daily.  If Triglycerides remain elevated, consider adding Vascepa.  Obtain fasting CMET, lipids prior to next visit in 6 months.   Dispo:  Return in about 6 months (around 06/22/2019) for Routine Follow Up, w/ Dr. Burt Knack, or Richardson Dopp, PA-C, in person.   Medication Adjustments/Labs and Tests Ordered: Current medicines are reviewed at length with the patient today.  Concerns regarding medicines are outlined above.  Tests Ordered: Orders Placed This Encounter  Procedures  . Comp Met (CMET)  . Lipid panel  . EKG 12-Lead   Medication Changes: No orders of the defined types were placed in  this encounter.   Signed, Richardson Dopp, PA-C  12/22/2018 9:45 AM    [facility] [place], [place], [place]  [address] Phone: [phone]; Fax: [phone]

## 2018-12-22 ENCOUNTER — Other Ambulatory Visit: Payer: Self-pay

## 2018-12-22 ENCOUNTER — Ambulatory Visit (INDEPENDENT_AMBULATORY_CARE_PROVIDER_SITE_OTHER): Payer: Medicare Other | Admitting: Physician Assistant

## 2018-12-22 ENCOUNTER — Encounter: Payer: Self-pay | Admitting: Physician Assistant

## 2018-12-22 VITALS — BP 112/68 | HR 68 | Ht 66.0 in | Wt 161.1 lb

## 2018-12-22 DIAGNOSIS — E782 Mixed hyperlipidemia: Secondary | ICD-10-CM

## 2018-12-22 DIAGNOSIS — N1832 Chronic kidney disease, stage 3b: Secondary | ICD-10-CM | POA: Diagnosis not present

## 2018-12-22 DIAGNOSIS — I5032 Chronic diastolic (congestive) heart failure: Secondary | ICD-10-CM | POA: Diagnosis not present

## 2018-12-22 DIAGNOSIS — I10 Essential (primary) hypertension: Secondary | ICD-10-CM | POA: Diagnosis not present

## 2018-12-22 DIAGNOSIS — I25119 Atherosclerotic heart disease of native coronary artery with unspecified angina pectoris: Secondary | ICD-10-CM | POA: Diagnosis not present

## 2018-12-22 NOTE — Patient Instructions (Addendum)
Medication Instructions:  Your physician recommends that you continue on your current medications as directed. Please refer to the Current Medication list given to you today.  *If you need a refill on your cardiac medications before your next appointment, please call your pharmacy*  Lab Work: 1 WEEK BEFORE YOUR NEXT APPOINTMENT IN 6 MONTHS:  COME TO THE OFFICE FOR CMET & LIPIDS.Marland Kitchen NOTHING TO EAT OR DRINK AFTER MIDNIGHT THE NIGHT BEFORE YOU COME  If you have labs (blood work) drawn today and your tests are completely normal, you will receive your results only by: Marland Kitchen MyChart Message (if you have MyChart) OR . A paper copy in the mail If you have any lab test that is abnormal or we need to change your treatment, we will call you to review the results.  Testing/Procedures: None ordered  Follow-Up: At [facility], you and your health needs are our priority.  As part of our continuing mission to provide you with exceptional heart care, we have created designated Provider Care Teams.  These Care Teams include your primary Cardiologist (physician) and Advanced Practice Providers (APPs -  Physician Assistants and Nurse Practitioners) who all work together to provide you with the care you need, when you need it.  Your next appointment:   6 month(s)  The format for your next appointment:   In Person  Provider:   Sherren Mocha, MD or Richardson Dopp, PA-C  Other Instructions

## 2019-02-11 ENCOUNTER — Other Ambulatory Visit: Payer: Self-pay | Admitting: Cardiovascular Disease

## 2019-04-01 DIAGNOSIS — E785 Hyperlipidemia, unspecified: Secondary | ICD-10-CM | POA: Diagnosis not present

## 2019-04-01 DIAGNOSIS — E1165 Type 2 diabetes mellitus with hyperglycemia: Secondary | ICD-10-CM | POA: Diagnosis not present

## 2019-04-01 DIAGNOSIS — Z125 Encounter for screening for malignant neoplasm of prostate: Secondary | ICD-10-CM | POA: Diagnosis not present

## 2019-04-01 DIAGNOSIS — Z7902 Long term (current) use of antithrombotics/antiplatelets: Secondary | ICD-10-CM | POA: Diagnosis not present

## 2019-04-01 DIAGNOSIS — I10 Essential (primary) hypertension: Secondary | ICD-10-CM | POA: Diagnosis not present

## 2019-04-01 DIAGNOSIS — E782 Mixed hyperlipidemia: Secondary | ICD-10-CM | POA: Diagnosis not present

## 2019-04-01 DIAGNOSIS — I2510 Atherosclerotic heart disease of native coronary artery without angina pectoris: Secondary | ICD-10-CM | POA: Diagnosis not present

## 2019-04-01 DIAGNOSIS — Z7984 Long term (current) use of oral hypoglycemic drugs: Secondary | ICD-10-CM | POA: Diagnosis not present

## 2019-04-03 DIAGNOSIS — Z23 Encounter for immunization: Secondary | ICD-10-CM | POA: Diagnosis not present

## 2019-06-10 DIAGNOSIS — N401 Enlarged prostate with lower urinary tract symptoms: Secondary | ICD-10-CM | POA: Diagnosis not present

## 2019-06-10 DIAGNOSIS — N200 Calculus of kidney: Secondary | ICD-10-CM | POA: Diagnosis not present

## 2019-06-10 DIAGNOSIS — R310 Gross hematuria: Secondary | ICD-10-CM | POA: Diagnosis not present

## 2019-06-22 ENCOUNTER — Other Ambulatory Visit: Payer: Medicare Other | Admitting: *Deleted

## 2019-06-22 ENCOUNTER — Other Ambulatory Visit: Payer: Self-pay | Admitting: *Deleted

## 2019-06-22 ENCOUNTER — Other Ambulatory Visit: Payer: Self-pay

## 2019-06-22 ENCOUNTER — Other Ambulatory Visit: Payer: Self-pay | Admitting: Cardiovascular Disease

## 2019-06-22 DIAGNOSIS — E782 Mixed hyperlipidemia: Secondary | ICD-10-CM

## 2019-06-22 DIAGNOSIS — I2510 Atherosclerotic heart disease of native coronary artery without angina pectoris: Secondary | ICD-10-CM | POA: Diagnosis not present

## 2019-06-22 LAB — BASIC METABOLIC PANEL
BUN/Creatinine Ratio: 11 (ref 10–24)
BUN: 18 mg/dL (ref 8–27)
CO2: 22 mmol/L (ref 20–29)
Calcium: 8.9 mg/dL (ref 8.6–10.2)
Chloride: 100 mmol/L (ref 96–106)
Creatinine, Ser: 1.64 mg/dL — ABNORMAL HIGH (ref 0.76–1.27)
GFR calc Af Amer: 48 mL/min/{1.73_m2} — ABNORMAL LOW (ref >59)
GFR calc non Af Amer: 41 mL/min/{1.73_m2} — ABNORMAL LOW (ref >59)
Glucose: 395 mg/dL — ABNORMAL HIGH (ref 65–99)
Potassium: 4.4 mmol/L (ref 3.5–5.2)
Sodium: 135 mmol/L (ref 134–144)

## 2019-06-22 LAB — HEPATIC FUNCTION PANEL
ALT: 13 IU/L (ref 0–44)
AST: 19 IU/L (ref 0–40)
Albumin: 4 g/dL (ref 3.7–4.7)
Alkaline Phosphatase: 143 IU/L — ABNORMAL HIGH (ref 48–121)
Bilirubin Total: 0.2 mg/dL (ref 0.0–1.2)
Bilirubin, Direct: 0.08 mg/dL (ref 0.00–0.40)
Total Protein: 6.8 g/dL (ref 6.0–8.5)

## 2019-06-22 LAB — LIPID PANEL
Chol/HDL Ratio: 5.2 ratio — ABNORMAL HIGH (ref 0.0–5.0)
Cholesterol, Total: 146 mg/dL (ref 100–199)
HDL: 28 mg/dL — ABNORMAL LOW (ref >39)
LDL Chol Calc (NIH): 59 mg/dL (ref 0–99)
Triglycerides: 380 mg/dL — ABNORMAL HIGH (ref 0–149)
VLDL Cholesterol Cal: 59 mg/dL — ABNORMAL HIGH (ref 5–40)

## 2019-06-29 ENCOUNTER — Other Ambulatory Visit: Payer: Self-pay

## 2019-06-29 ENCOUNTER — Ambulatory Visit (INDEPENDENT_AMBULATORY_CARE_PROVIDER_SITE_OTHER): Payer: Medicare Other | Admitting: Cardiovascular Disease

## 2019-06-29 ENCOUNTER — Encounter: Payer: Self-pay | Admitting: Cardiovascular Disease

## 2019-06-29 VITALS — BP 98/56 | HR 55 | Ht 66.0 in | Wt 163.0 lb

## 2019-06-29 DIAGNOSIS — E782 Mixed hyperlipidemia: Secondary | ICD-10-CM

## 2019-06-29 DIAGNOSIS — N1832 Chronic kidney disease, stage 3b: Secondary | ICD-10-CM | POA: Diagnosis not present

## 2019-06-29 DIAGNOSIS — I10 Essential (primary) hypertension: Secondary | ICD-10-CM | POA: Diagnosis not present

## 2019-06-29 DIAGNOSIS — I25119 Atherosclerotic heart disease of native coronary artery with unspecified angina pectoris: Secondary | ICD-10-CM

## 2019-06-29 NOTE — Patient Instructions (Signed)
Medication Instructions:  Your provider recommends that you continue on your current medications as directed. Please refer to the Current Medication list given to you today.   *If you need a refill on your cardiac medications before your next appointment, please call your pharmacy*  Follow-Up: At [facility], you and your health needs are our priority.  As part of our continuing mission to provide you with exceptional heart care, we have created designated Provider Care Teams.  These Care Teams include your primary Cardiologist (physician) and Advanced Practice Providers (APPs -  Physician Assistants and Nurse Practitioners) who all work together to provide you with the care you need, when you need it. Your next appointment:   6 month(s) The format for your next appointment:   In Person Provider:   Scott Weaver, PA-C 

## 2019-06-29 NOTE — Progress Notes (Signed)
Cardiology Office Note:    Date:  06/29/2019   ID:  [name], DOB [birth year], MRN [id number]  PCP:  Allean Found, MD  [facility] Cardiologist:  Sherren Mocha, MD  [facility] Electrophysiologist:  None   Referring MD: Allean Found, MD   Chief Complaint  Patient presents with   Coronary Artery Disease    History of Present Illness:    [name] is a 74 y.o. male with a hx of:  Coronary artery disease w/ angina ? S/p CABG in 1993 ? S/p PCI to S-OM in 2009 and again in 2012, 2014, 2018 ? S/p POBA to S-OM1 and DES to OM in 2355  Chronic diastolic CHF  Chronic kidney disease   Hypertension   Hyperlipidemia   The patient is here with his wife today.  He has been doing fairly well from a cardiac perspective.  He denies any exertional chest pain or pressure.  No shortness of breath, orthopnea, PND, heart palpitations, or leg swelling.  He does have occasional episodes of pain in his left neck, but these are always when he is sitting down and resting.  He has not been able to find any oral hypoglycemics that he is able to tolerate.  He has had stomach upset and GI problems with all of them.  He is currently not taking anything for his diabetes and his most recent fasting glucose was markedly elevated at 395.  Past Medical History:  Diagnosis Date   Anemia    Blood transfusion    CAD (coronary artery disease)    a. CABG 1993;  b. PCI of SVG to OM Jan 2012 (Drug-eluting stent x 2);  c. 09/2012 Cath/PCI: LM nl, LAD sev dzs, LCX 100, RCA patent stent, VG->AM 100, LIMA->LAD nl, VG->OM 95p ISR (PTCA only), patent distal stent. // d. LHC 3/18: pLAD 100, oRI 100, pLCx 100, Lat OM 95, RCA stent ok, S-PDA 100, L-LAD ok, S-OM1 stent 80 ISR >> PCI: POBA to S-OM1; DES to native Lat OM1   CKD (chronic kidney disease), stage III    Degeneration of lumbar or lumbosacral intervertebral disc    GERD (gastroesophageal reflux disease)    H/O hiatal hernia    History of  echocardiogram    Echo 3/18: Mild LVH, EF 60-65, Gr 2 DD   History of nephrolithiasis    Hyperlipidemia    Hyperplastic colon polyp    Hypertension    Myocardial infarction ([place])    hx of five    Stricture and stenosis of esophagus     Past Surgical History:  Procedure Laterality Date   ANGIOPLASTY     BACK SURGERY     CARDIAC CATHETERIZATION     COLONOSCOPY     CORONARY ARTERY BYPASS GRAFT  1993   CORONARY STENT INTERVENTION N/A 03/19/2016   Procedure: Coronary Stent Intervention;  Surgeon: Sherren Mocha, MD;  Location: [place] CV LAB;  Service: Cardiovascular;  Laterality: N/A;   HOLMIUM LASER APPLICATION Right 7/32/2025   Procedure: HOLMIUM LASER APPLICATION;  Surgeon: Jorja Loa, MD;  Location: [facility];  Service: Urology;  Laterality: Right;   LEFT HEART CATH AND CORS/GRAFTS ANGIOGRAPHY N/A 03/19/2016   Procedure: Left Heart Cath and Cors/Grafts Angiography;  Surgeon: Sherren Mocha, MD;  Location: [place] CV LAB;  Service: Cardiovascular;  Laterality: N/A;   LEFT HEART CATHETERIZATION WITH CORONARY/GRAFT ANGIOGRAM N/A 09/23/2012   Procedure: LEFT HEART CATHETERIZATION WITH Beatrix Fetters;  Surgeon: Peter M Martinique, MD;  Location: [facility] CATH  LAB;  Service: Cardiovascular;  Laterality: N/A;   LITHOTRIPSY     Lumbar back     NEPHROLITHOTOMY Right 09/17/2013   Procedure: NEPHROLITHOTOMY PERCUTANEOUS;  Surgeon: Jorja Loa, MD;  Location: [facility];  Service: Urology;  Laterality: Right;   PERCUTANEOUS CORONARY INTERVENTION-BALLOON ONLY  09/23/2012   Procedure: PERCUTANEOUS CORONARY INTERVENTION-BALLOON ONLY;  Surgeon: Peter M Martinique, MD;  Location: [facility] CATH LAB;  Service: Cardiovascular;;  OM vein graft    Current Medications: Current Meds  Medication Sig   amLODipine (NORVASC) 5 MG tablet ONE TABLET EVERY MORNING   clopidogrel (PLAVIX) 75 MG tablet ONE TABLET AT BEDTIME   ferrous sulfate 325 (65 FE) MG tablet Take 325 mg by mouth as  needed.    furosemide (LASIX) 20 MG tablet Take 20 mg by mouth as needed for fluid.   isosorbide mononitrate (IMDUR) 60 MG 24 hr tablet TAKE ONE AND ONE-HALF TABLETS DAILY   metoprolol tartrate (LOPRESSOR) 50 MG tablet TAKE ONE TABLET TWICE DAILY   nitroGLYCERIN (NITROSTAT) 0.4 MG SL tablet Place 1 tablet (0.4 mg total) under the tongue every 5 (five) minutes as needed for chest pain. Up to 3 doses   pantoprazole (PROTONIX) 40 MG tablet TAKE ONE (1) TABLET EACH DAY   rosuvastatin (CRESTOR) 20 MG tablet TAKE ONE (1) TABLET EACH DAY   Current Facility-Administered Medications for the 06/29/19 encounter (Office Visit) with Sherren Mocha, MD  Medication   triamcinolone acetonide (KENALOG) 10 MG/ML injection 10 mg   triamcinolone acetonide (KENALOG) 10 MG/ML injection 10 mg     Allergies:   Patient has no known allergies.   Social History   Socioeconomic History   Marital status: Married    Spouse name: Not on file   Number of children: 2   Years of education: Not on file   Highest education level: Not on file  Occupational History   Occupation: Retired    Fish farm manager: DISABLED    Comment: Chemical engineer  Tobacco Use   Smoking status: Former Smoker    Quit date: 01/06/1987    Years since quitting: 32.4   Smokeless tobacco: Former Counsellor Use: Never used  Substance and Sexual Activity   Alcohol use: No   Drug use: No   Sexual activity: Not on file  Other Topics Concern   Not on file  Social History Narrative   Not on file   Social Determinants of Health   Financial Resource Strain:    Difficulty of Paying Living Expenses:   Food Insecurity:    Worried About Charity fundraiser in the Last Year:    Arboriculturist in the Last Year:   Transportation Needs:    Film/video editor (Medical):    Lack of Transportation (Non-Medical):   Physical Activity:    Days of Exercise per Week:    Minutes of Exercise per Session:     Stress:    Feeling of Stress :   Social Connections:    Frequency of Communication with Friends and Family:    Frequency of Social Gatherings with Friends and Family:    Attends Religious Services:    Active Member of Clubs or Organizations:    Attends Music therapist:    Marital Status:      Family History: The patient's family history includes CAD (age of onset: 68) in some other family members; Coronary artery disease (age of onset: 43) in his mother; Kidney cancer in  his mother; Lung cancer in his brother and sister. There is no history of Colon cancer.  ROS:   Please see the history of present illness.    All other systems reviewed and are negative.  EKGs/Labs/Other Studies Reviewed:    EKG:  EKG is not ordered today.    Recent Labs: 06/22/2019: ALT 13; BUN 18; Creatinine, Ser 1.64; Potassium 4.4; Sodium 135  Recent Lipid Panel    Component Value Date/Time   CHOL 146 06/22/2019 0822   TRIG 380 (H) 06/22/2019 0822   HDL 28 (L) 06/22/2019 0822   CHOLHDL 5.2 (H) 06/22/2019 0822   CHOLHDL 4.4 01/11/2015 0820   VLDL 24 01/11/2015 0820   LDLCALC 59 06/22/2019 0822   LDLDIRECT 122.5 12/02/2013 0922    Physical Exam:    VS:  BP (!) 98/56    Pulse (!) 55    Ht 5\' 6"  (1.676 m)    Wt 163 lb (73.9 kg)    SpO2 97%    BMI 26.31 kg/m     Wt Readings from Last 3 Encounters:  06/29/19 163 lb (73.9 kg)  12/22/18 161 lb 1.9 oz (73.1 kg)  06/25/18 165 lb 12.8 oz (75.2 kg)     GEN:  Well nourished, well developed in no acute distress HEENT: Normal NECK: No JVD; No carotid bruits LYMPHATICS: No lymphadenopathy CARDIAC: RRR, no murmurs, rubs, gallops RESPIRATORY:  Clear to auscultation without rales, wheezing or rhonchi  ABDOMEN: Soft, non-tender, non-distended MUSCULOSKELETAL:  No edema; No deformity  SKIN: Warm and dry NEUROLOGIC:  Alert and oriented x 3 PSYCHIATRIC:  Normal affect   ASSESSMENT:    1. Coronary artery disease involving native  coronary artery of native heart with angina pectoris ([place])   2. Mixed hyperlipidemia   3. Stage 3b chronic kidney disease   4. Essential hypertension    PLAN:    In order of problems listed above:  1. Well-controlled on a combination of amlodipine, isosorbide, and metoprolol.  He continues on antiplatelet therapy with clopidogrel and lipid-lowering with rosuvastatin 20 mg daily. 2. Treated with rosuvastatin.  LDL cholesterol is 59 mg/dL.  Triglycerides are elevated likely from uncontrolled diabetes with a triglyceride level of 380 mg/dL.  Advised getting back with his primary care physician in figuring out a plan for his diabetes.  If there are no oral hypoglycemics that he can tolerate, he is advised to try insulin therapy.  He may require endocrinology referral.  He will reach out to his primary care physician this week. 3. Stable with recent creatinine 1.64 mg/dL.  I stressed the importance of controlling his diabetes with him. 4. Well controlled on amlodipine, isosorbide, and metoprolol.  Blood pressure low today, but the patient denies any dizziness, lightheadedness, or presyncope.  Continue same therapy.   Medication Adjustments/Labs and Tests Ordered: Current medicines are reviewed at length with the patient today.  Concerns regarding medicines are outlined above.  No orders of the defined types were placed in this encounter.  No orders of the defined types were placed in this encounter.   Patient Instructions  Medication Instructions:  Your provider recommends that you continue on your current medications as directed. Please refer to the Current Medication list given to you today.   *If you need a refill on your cardiac medications before your next appointment, please call your pharmacy*  Follow-Up: At [facility], you and your health needs are our priority.  As part of our continuing mission to provide you with exceptional heart  care, we have created designated Provider Care  Teams.  These Care Teams include your primary Cardiologist (physician) and Advanced Practice Providers (APPs -  Physician Assistants and Nurse Practitioners) who all work together to provide you with the care you need, when you need it. Your next appointment:   6 month(s) The format for your next appointment:   In Person Provider:   Richardson Dopp, PA-C     Signed, Sherren Mocha, MD  06/29/2019 8:57 AM    [place]

## 2019-07-01 DIAGNOSIS — R310 Gross hematuria: Secondary | ICD-10-CM | POA: Diagnosis not present

## 2019-07-12 ENCOUNTER — Other Ambulatory Visit: Payer: Self-pay | Admitting: Physician Assistant

## 2019-07-12 DIAGNOSIS — I10 Essential (primary) hypertension: Secondary | ICD-10-CM

## 2019-07-15 DIAGNOSIS — N200 Calculus of kidney: Secondary | ICD-10-CM | POA: Diagnosis not present

## 2019-07-15 DIAGNOSIS — N401 Enlarged prostate with lower urinary tract symptoms: Secondary | ICD-10-CM | POA: Diagnosis not present

## 2019-07-15 DIAGNOSIS — N2889 Other specified disorders of kidney and ureter: Secondary | ICD-10-CM | POA: Diagnosis not present

## 2019-07-15 DIAGNOSIS — N281 Cyst of kidney, acquired: Secondary | ICD-10-CM | POA: Diagnosis not present

## 2019-07-15 DIAGNOSIS — R9341 Abnormal radiologic findings on diagnostic imaging of renal pelvis, ureter, or bladder: Secondary | ICD-10-CM | POA: Diagnosis not present

## 2019-07-19 ENCOUNTER — Other Ambulatory Visit: Payer: Self-pay | Admitting: Physician Assistant

## 2019-07-19 DIAGNOSIS — I10 Essential (primary) hypertension: Secondary | ICD-10-CM

## 2019-07-19 DIAGNOSIS — I25110 Atherosclerotic heart disease of native coronary artery with unstable angina pectoris: Secondary | ICD-10-CM

## 2019-07-19 DIAGNOSIS — E785 Hyperlipidemia, unspecified: Secondary | ICD-10-CM

## 2019-08-06 ENCOUNTER — Other Ambulatory Visit: Payer: Self-pay | Admitting: Cardiovascular Disease

## 2019-12-28 NOTE — Progress Notes (Signed)
Cardiology Office Note:    Date:  12/29/2019   ID:  [name], DOB 11-15-1945, MRN [id number]  PCP:  Allean Found, MD  [facility] Cardiologist:  Sherren Mocha, MD   [place] Electrophysiologist:  None   Referring MD: Allean Found, MD   Chief Complaint:  Follow-up (CAD)    Patient Profile:    [name] is a 74 y.o. male with:   Coronary artery disease w/ angina ? S/p CABG in 1993 ? S/p PCI to S-OM in 2009 and again in 2012, 2014, 2018 ? S/p POBA to S-OM1 and DES to OM in 2018  Heart failure with preserved ejection fraction   Chronic kidney disease   Hypertension   Hyperlipidemia    Prior CV studies: Echo 03/20/16 Mild LVH, EF 60-65, Gr 2 DD  LHC 03/19/16 LAD proximal 100  RI ostial 100  LCx proximal 100, lateral OM1 95 RCA stent patent with 30-40 ISR SVG-RPDA 100 LIMA-LAD patent SVG-OM1 proximal stent 80 ISR PCI: POBA to the SVG-OM1;Synergy Des 2.25x16 to the native lateral OM1  History of Present Illness:    [name] was last seen by Dr. Burt Knack in 6/21. He returns for follow up.  He is here alone.  Overall, he has been doing well.  He has not had significant chest discomfort or shortness of breath.  He does note a feeling of fatigue and bilateral hips when going up hills.  He also has a cold sensation in his thighs at times.  He does not have any calf pain.  He has not had orthopnea, syncope.  He has been back on medication for diabetes since his last visit with Dr. Burt Knack.  His blood sugars are much better controlled now.  Past Medical History:  Diagnosis Date  . Anemia   . Blood transfusion   . CAD (coronary artery disease)    a. CABG 1993;  b. PCI of SVG to OM Jan 2012 (Drug-eluting stent x 2);  c. 09/2012 Cath/PCI: LM nl, LAD sev dzs, LCX 100, RCA patent stent, VG->AM 100, LIMA->LAD nl, VG->OM 95p ISR (PTCA only), patent distal stent. // d. LHC 3/18: pLAD 100, oRI 100, pLCx 100, Lat OM 95, RCA stent ok, S-PDA 100, L-LAD ok, S-OM1 stent  80 ISR >> PCI: POBA to S-OM1; DES to native Lat OM1  . CKD (chronic kidney disease), stage III ([place])   . Degeneration of lumbar or lumbosacral intervertebral disc   . GERD (gastroesophageal reflux disease)   . H/O hiatal hernia   . History of echocardiogram    Echo 3/18: Mild LVH, EF 60-65, Gr 2 DD  . History of nephrolithiasis   . Hyperlipidemia   . Hyperplastic colon polyp   . Hypertension   . Myocardial infarction ([facility])    hx of five   . Stricture and stenosis of esophagus     Current Medications: Current Meds  Medication Sig  . amLODipine (NORVASC) 5 MG tablet TAKE 1 TABLET BY MOUTH EVERY MORNING  . clopidogrel (PLAVIX) 75 MG tablet TAKE 1 TABLET BY MOUTH AT BEDTIME  . ferrous sulfate 325 (65 FE) MG tablet Take 325 mg by mouth as needed.   . furosemide (LASIX) 20 MG tablet Take 20 mg by mouth as needed for fluid.  Marland Kitchen glimepiride (AMARYL) 2 MG tablet Take 2 mg by mouth in the morning and at bedtime.  . isosorbide mononitrate (IMDUR) 60 MG 24 hr tablet TAKE ONE AND ONE-HALF TABLETS DAILY  . metoprolol tartrate (LOPRESSOR) 50  MG tablet TAKE ONE TABLET TWICE DAILY  . nitroGLYCERIN (NITROSTAT) 0.4 MG SL tablet Place 1 tablet (0.4 mg total) under the tongue every 5 (five) minutes as needed for chest pain. Up to 3 doses  . pantoprazole (PROTONIX) 40 MG tablet TAKE ONE (1) TABLET EACH DAY  . rosuvastatin (CRESTOR) 20 MG tablet TAKE 1 TABLET BY MOUTH EVERY DAY   Current Facility-Administered Medications for the 12/29/19 encounter (Office Visit) with Tereso Newcomer T, PA-C  Medication  . triamcinolone acetonide (KENALOG) 10 MG/ML injection 10 mg  . triamcinolone acetonide (KENALOG) 10 MG/ML injection 10 mg     Allergies:   Patient has no known allergies.   Social History   Tobacco Use  . Smoking status: Former Smoker    Quit date: 01/06/1987    Years since quitting: 33.0  . Smokeless tobacco: Former Clinical biochemist  . Vaping Use: Never used  Substance Use Topics  . Alcohol use:  No  . Drug use: No     Family Hx: The patient's family history includes CAD (age of onset: 93) in some other family members; Coronary artery disease (age of onset: 62) in his mother; Kidney cancer in his mother; Lung cancer in his brother and sister. There is no history of Colon cancer.  ROS   EKGs/Labs/Other Test Reviewed:    EKG:  EKG is   ordered today.  The ekg ordered today demonstrates normal sinus rhythm, heart rate 72, inferior Q waves, poor R wave progression, no ST-T wave changes, no significant change since prior tracing, QTC 422  Recent Labs: 06/22/2019: ALT 13; BUN 18; Creatinine, Ser 1.64; Potassium 4.4; Sodium 135   Recent Lipid Panel Lab Results  Component Value Date/Time   CHOL 146 06/22/2019 08:22 AM   TRIG 380 (H) 06/22/2019 08:22 AM   HDL 28 (L) 06/22/2019 08:22 AM   CHOLHDL 5.2 (H) 06/22/2019 08:22 AM   CHOLHDL 4.4 01/11/2015 08:20 AM   LDLCALC 59 06/22/2019 08:22 AM   LDLDIRECT 122.5 12/02/2013 09:22 AM      Risk Assessment/Calculations:      Physical Exam:    VS:  BP 132/62   Pulse 72   Ht 5\' 6"  (1.676 m)   Wt 168 lb 6.4 oz (76.4 kg)   SpO2 97%   BMI 27.18 kg/m     Wt Readings from Last 3 Encounters:  12/29/19 168 lb 6.4 oz (76.4 kg)  06/29/19 163 lb (73.9 kg)  12/22/18 161 lb 1.9 oz (73.1 kg)     Constitutional:      Appearance: Healthy appearance. Not in distress.  Neck:     Vascular: No JVR. JVD normal.  Pulmonary:     Effort: Pulmonary effort is normal.     Breath sounds: No wheezing. No rales.  Cardiovascular:     Normal rate. Regular rhythm. Normal S1. Normal S2.     Murmurs: There is no murmur.     Comments: No FA bruits Pulses:    Intact distal pulses.  Edema:    Peripheral edema absent.  Abdominal:     Palpations: Abdomen is soft. There is no hepatomegaly.  Skin:    General: Skin is warm and dry.  Neurological:     General: No focal deficit present.     Mental Status: Alert and oriented to person, place and time.      Cranial Nerves: Cranial nerves are intact.       ASSESSMENT & PLAN:    1. Coronary artery  disease involving native coronary artery of native heart with angina pectoris ([place]) S/pCABG in 1993 and multiple PCI procedures since. Most recent PCI 3/18 with angioplasty to the vein graft to the first obtuse marginal and DES to the native lateral OM1.  He has not had any significant changes in his anginal pattern.  Continue current regimen which includes amlodipine, clopidogrel, isosorbide mononitrate, metoprolol tartrate, rosuvastatin.  Follow-up with Dr. Burt Knack in 6 months.  2. Stage 3b chronic kidney disease ([place]) Obtain follow-up CMET today  3. Essential hypertension The patient's blood pressure is controlled on his current regimen.  Continue current therapy.   4. Mixed hyperlipidemia His sugars are much better controlled since resuming his glimepiride.  Obtain follow-up CMET, lipid panel.  5. Type 2 diabetes mellitus with complication ([place]) Continue follow-up with primary care.  6. Claudication [facility]) He does note some hip discomfort that sounds concerning for claudication.  Arrange ABIs/arterial Dopplers.    Dispo:  Return in about 6 months (around 06/28/2020) for Routine Follow Up, w/ Dr. Burt Knack, in person.   Medication Adjustments/Labs and Tests Ordered: Current medicines are reviewed at length with the patient today.  Concerns regarding medicines are outlined above.  Tests Ordered: Orders Placed This Encounter  Procedures  . Comprehensive metabolic panel  . Lipid panel  . EKG 12-Lead  . VAS [place] ABI WITH/WO TBI  . VAS [place] LOWER EXTREMITY ARTERIAL DUPLEX   Medication Changes: No orders of the defined types were placed in this encounter.   Signed, Richardson Dopp, PA-C  12/29/2019 8:38 AM    [facility] [address], [place], [place]  [address] Phone: [phone]; Fax: [phone]

## 2019-12-29 ENCOUNTER — Other Ambulatory Visit: Payer: Self-pay

## 2019-12-29 ENCOUNTER — Ambulatory Visit (INDEPENDENT_AMBULATORY_CARE_PROVIDER_SITE_OTHER): Payer: Medicare Other | Admitting: Physician Assistant

## 2019-12-29 ENCOUNTER — Encounter: Payer: Self-pay | Admitting: Physician Assistant

## 2019-12-29 VITALS — BP 132/62 | HR 72 | Ht 66.0 in | Wt 168.4 lb

## 2019-12-29 DIAGNOSIS — N1832 Chronic kidney disease, stage 3b: Secondary | ICD-10-CM

## 2019-12-29 DIAGNOSIS — I25119 Atherosclerotic heart disease of native coronary artery with unspecified angina pectoris: Secondary | ICD-10-CM | POA: Diagnosis not present

## 2019-12-29 DIAGNOSIS — E118 Type 2 diabetes mellitus with unspecified complications: Secondary | ICD-10-CM | POA: Diagnosis not present

## 2019-12-29 DIAGNOSIS — I10 Essential (primary) hypertension: Secondary | ICD-10-CM | POA: Diagnosis not present

## 2019-12-29 DIAGNOSIS — E782 Mixed hyperlipidemia: Secondary | ICD-10-CM | POA: Diagnosis not present

## 2019-12-29 DIAGNOSIS — I739 Peripheral vascular disease, unspecified: Secondary | ICD-10-CM

## 2019-12-29 NOTE — Patient Instructions (Signed)
Medication Instructions:  Your physician recommends that you continue on your current medications as directed. Please refer to the Current Medication list given to you today.  *If you need a refill on your cardiac medications before your next appointment, please call your pharmacy*  Lab Work: You will have labs drawn today: CMET/Lipids  Testing/Procedures: Your physician has requested that you have an ankle brachial index (ABI). During this test an ultrasound and blood pressure cuff are used to evaluate the arteries that supply the arms and legs with blood. Allow thirty minutes for this exam. There are no restrictions or special instructions.  Your physician has requested that you have a lower extremity arterial duplex. This test is an ultrasound of the arteries in the legs. It looks at arterial blood flow in the legs. Allow one hour for lower arterial scans. There are no restrictions or special instructions  Follow-Up: At [facility], you and your health needs are our priority.  As part of our continuing mission to provide you with exceptional heart care, we have created designated Provider Care Teams.  These Care Teams include your primary Cardiologist (physician) and Advanced Practice Providers (APPs -  Physician Assistants and Nurse Practitioners) who all work together to provide you with the care you need, when you need it.  Your next appointment:   6 month(s)  The format for your next appointment:   In Person  Provider:   Tonny Bollman, MD

## 2019-12-30 ENCOUNTER — Other Ambulatory Visit: Payer: Self-pay

## 2019-12-30 DIAGNOSIS — I25119 Atherosclerotic heart disease of native coronary artery with unspecified angina pectoris: Secondary | ICD-10-CM

## 2019-12-30 DIAGNOSIS — E782 Mixed hyperlipidemia: Secondary | ICD-10-CM

## 2019-12-30 LAB — LIPID PANEL
Chol/HDL Ratio: 5.6 ratio — ABNORMAL HIGH (ref 0.0–5.0)
Cholesterol, Total: 134 mg/dL (ref 100–199)
HDL: 24 mg/dL — ABNORMAL LOW (ref >39)
LDL Chol Calc (NIH): 54 mg/dL (ref 0–99)
Triglycerides: 363 mg/dL — ABNORMAL HIGH (ref 0–149)
VLDL Cholesterol Cal: 56 mg/dL — ABNORMAL HIGH (ref 5–40)

## 2019-12-30 LAB — COMPREHENSIVE METABOLIC PANEL
ALT: 11 IU/L (ref 0–44)
AST: 15 IU/L (ref 0–40)
Albumin/Globulin Ratio: 1.6 (ref 1.2–2.2)
Albumin: 4.1 g/dL (ref 3.7–4.7)
Alkaline Phosphatase: 103 IU/L (ref 44–121)
BUN/Creatinine Ratio: 10 (ref 10–24)
BUN: 15 mg/dL (ref 8–27)
Bilirubin Total: <0.2 mg/dL (ref 0.0–1.2)
CO2: 22 mmol/L (ref 20–29)
Calcium: 9.5 mg/dL (ref 8.6–10.2)
Chloride: 103 mmol/L (ref 96–106)
Creatinine, Ser: 1.48 mg/dL — ABNORMAL HIGH (ref 0.76–1.27)
GFR calc Af Amer: 53 mL/min/{1.73_m2} — ABNORMAL LOW (ref >59)
GFR calc non Af Amer: 46 mL/min/{1.73_m2} — ABNORMAL LOW (ref >59)
Globulin, Total: 2.6 g/dL (ref 1.5–4.5)
Glucose: 306 mg/dL — ABNORMAL HIGH (ref 65–99)
Potassium: 4.3 mmol/L (ref 3.5–5.2)
Sodium: 139 mmol/L (ref 134–144)
Total Protein: 6.7 g/dL (ref 6.0–8.5)

## 2019-12-30 LAB — SPECIMEN STATUS REPORT

## 2019-12-30 MED ORDER — ICOSAPENT ETHYL 1 G PO CAPS: 2.0000 g | ORAL_CAPSULE | Freq: Two times a day (BID) | ORAL | 6 refills | Status: DC

## 2020-01-14 ENCOUNTER — Other Ambulatory Visit: Payer: Self-pay

## 2020-01-14 ENCOUNTER — Other Ambulatory Visit: Payer: Self-pay | Admitting: Physician Assistant

## 2020-01-14 ENCOUNTER — Ambulatory Visit (HOSPITAL_COMMUNITY)
Admission: RE | Admit: 2020-01-14 | Discharge: 2020-01-14 | Disposition: A | Discharge status: Home / Self Care | Payer: Medicare Other | Source: Ambulatory Visit | Attending: Cardiology | Admitting: Cardiology

## 2020-01-14 DIAGNOSIS — I739 Peripheral vascular disease, unspecified: Secondary | ICD-10-CM

## 2020-01-18 ENCOUNTER — Encounter: Payer: Self-pay | Admitting: Physician Assistant

## 2020-01-18 DIAGNOSIS — I739 Peripheral vascular disease, unspecified: Secondary | ICD-10-CM | POA: Insufficient documentation

## 2020-01-28 ENCOUNTER — Telehealth: Payer: Self-pay | Admitting: *Deleted

## 2020-01-28 DIAGNOSIS — I739 Peripheral vascular disease, unspecified: Secondary | ICD-10-CM

## 2020-01-28 NOTE — Telephone Encounter (Signed)
Tried to call pt on the 910 area code # though recording came on stating service note set up. I then called the [phone] # under pt's wife name. I was able to reach the pt and his wife. Both have been notified of LEA results and of recommendations. Both are agreeable to plan of care for pt to see either Dr. Gwenlyn Found or Dr. Fletcher Anon for [place]. I assured them both I will place referral today and expect a call from our office with the next week to schedule appt. I did inform them the PV Consult will be at our [place] office where [employer] is at [facility]. I asked pt and his wife would it be better that we change the contact # to the [phone] instead of the [phone] area code, pt and wife agree to change primary contact #. Both the pt and his wife thanked me for the call and the help. Patient notified of result.  Please refer to phone note from today for complete details.   Julaine Hua, CMA 01/28/2020 1:31 PM

## 2020-01-28 NOTE — Telephone Encounter (Signed)
-----   Message from Liliane Shi, [place] sent at 01/18/2020  9:42 AM EST ----- The lower extremity arterial ultrasound shows evidence of significant blockages in the artery just above the groin on both sides.  This is likely the cause of his symptoms.  He will need to see one of our PV doctors for evaluation.   PLAN:   -Continue current medications  -Refer to PV (Dr. Gwenlyn Found or Dr. Fletcher Anon)  -Send copy to PCP. Richardson Dopp, PA-C    01/18/2020 9:37 AM

## 2020-02-02 ENCOUNTER — Encounter: Payer: Self-pay | Admitting: Cardiovascular Disease

## 2020-02-02 ENCOUNTER — Ambulatory Visit (INDEPENDENT_AMBULATORY_CARE_PROVIDER_SITE_OTHER): Payer: Medicare Other | Admitting: Cardiovascular Disease

## 2020-02-02 ENCOUNTER — Other Ambulatory Visit: Payer: Self-pay

## 2020-02-02 VITALS — BP 142/70 | HR 64 | Ht 66.0 in | Wt 169.6 lb

## 2020-02-02 DIAGNOSIS — I10 Essential (primary) hypertension: Secondary | ICD-10-CM

## 2020-02-02 DIAGNOSIS — E7800 Pure hypercholesterolemia, unspecified: Secondary | ICD-10-CM | POA: Diagnosis not present

## 2020-02-02 DIAGNOSIS — I739 Peripheral vascular disease, unspecified: Secondary | ICD-10-CM

## 2020-02-02 DIAGNOSIS — R0989 Other specified symptoms and signs involving the circulatory and respiratory systems: Secondary | ICD-10-CM | POA: Diagnosis not present

## 2020-02-02 DIAGNOSIS — I2510 Atherosclerotic heart disease of native coronary artery without angina pectoris: Secondary | ICD-10-CM

## 2020-02-02 DIAGNOSIS — Z01818 Encounter for other preprocedural examination: Secondary | ICD-10-CM

## 2020-02-02 MED ORDER — SODIUM CHLORIDE 0.9% FLUSH: 3.0000 mL | Freq: Two times a day (BID) | INTRAVENOUS | Status: DC

## 2020-02-02 NOTE — Progress Notes (Signed)
Cardiology Office Note   Date:  02/02/2020   ID:  [name], DOB 11-22-1945, MRN [id number]  PCP:  Allean Found, MD  Cardiologist: Dr. Burt Knack  No chief complaint on file.     History of Present Illness: [name] is a 75 y.o. male who was referred by Richardson Dopp for evaluation management of peripheral arterial disease. He has known history of coronary artery disease status post CABG in 1993 with multiple subsequent PCI's, chronic diastolic heart failure, chronic kidney disease, essential hypertension and hyperlipidemia. He reports bilateral hip and thigh discomfort with minimal walking especially if he is going uphill.  This started about 1 year ago with gradual worsening.  Symptoms happen with less than 1 block on flat level.  No lower extremity ulceration.  He frequently has cramps at rest as well.  Left side is worse on the right side. He underwent recent noninvasive vascular evaluation which showed an ABI of 1.08 on the right and 0.85 on the left.  Duplex showed severe bilateral common iliac artery disease worse on the left side.  He denies chest pain or worsening dyspnea.  He takes his medications regularly.  Past Medical History:  Diagnosis Date  . Anemia   . Blood transfusion   . CAD (coronary artery disease)    a. CABG 1993;  b. PCI of SVG to OM Jan 2012 (Drug-eluting stent x 2);  c. 09/2012 Cath/PCI: LM nl, LAD sev dzs, LCX 100, RCA patent stent, VG->AM 100, LIMA->LAD nl, VG->OM 95p ISR (PTCA only), patent distal stent. // d. LHC 3/18: pLAD 100, oRI 100, pLCx 100, Lat OM 95, RCA stent ok, S-PDA 100, L-LAD ok, S-OM1 stent 80 ISR >> PCI: POBA to S-OM1; DES to native Lat OM1  . CKD (chronic kidney disease), stage III ([place])   . Degeneration of lumbar or lumbosacral intervertebral disc   . GERD (gastroesophageal reflux disease)   . H/O hiatal hernia   . History of echocardiogram    Echo 3/18: Mild LVH, EF 60-65, Gr 2 DD  . History of nephrolithiasis   . Hyperlipidemia    . Hyperplastic colon polyp   . Hypertension   . Myocardial infarction (HCC)    hx of five   . PAD (peripheral artery disease) ([place])    LE Arterial US 1/22: R CIA > 50% prox and mid and < 50% distal; R ATA mid occlusion  . Stricture and stenosis of esophagus     Past Surgical History:  Procedure Laterality Date  . ANGIOPLASTY    . BACK SURGERY    . CARDIAC CATHETERIZATION    . COLONOSCOPY    . CORONARY ARTERY BYPASS GRAFT  1993  . CORONARY STENT INTERVENTION N/A 03/19/2016   Procedure: Coronary Stent Intervention;  Surgeon: Sherren Mocha, MD;  Location: [place] [facility];  Service: Cardiovascular;  Laterality: N/A;  . HOLMIUM LASER APPLICATION Right 06/17/735   Procedure: HOLMIUM LASER APPLICATION;  Surgeon: Jorja Loa, MD;  Location: [facility];  Service: Urology;  Laterality: Right;  . LEFT HEART CATH AND CORS/GRAFTS ANGIOGRAPHY N/A 03/19/2016   Procedure: Left Heart Cath and Cors/Grafts Angiography;  Surgeon: Sherren Mocha, MD;  Location: [place] [facility];  Service: Cardiovascular;  Laterality: N/A;  . LEFT HEART CATHETERIZATION WITH CORONARY/GRAFT ANGIOGRAM N/A 09/23/2012   Procedure: LEFT HEART CATHETERIZATION WITH Beatrix Fetters;  Surgeon: Peter M Martinique, MD;  Location: [facility];  Service: Cardiovascular;  Laterality: N/A;  . LITHOTRIPSY    . Lumbar  back    . NEPHROLITHOTOMY Right 09/17/2013   Procedure: NEPHROLITHOTOMY PERCUTANEOUS;  Surgeon: Jorja Loa, MD;  Location: [facility];  Service: Urology;  Laterality: Right;  . PERCUTANEOUS CORONARY INTERVENTION-BALLOON ONLY  09/23/2012   Procedure: PERCUTANEOUS CORONARY INTERVENTION-BALLOON ONLY;  Surgeon: Peter M Martinique, MD;  Location: [facility] C[facility];  Service: Cardiovascular;;  OM vein graft     Current Outpatient Medications  Medication Sig Dispense Refill  . amLODipine (NORVASC) 5 MG tablet TAKE 1 TABLET BY MOUTH EVERY MORNING 90 tablet 3  . clopidogrel (PLAVIX) 75 MG tablet TAKE 1 TABLET BY MOUTH AT  BEDTIME 90 tablet 3  . ferrous sulfate 325 (65 FE) MG tablet Take 325 mg by mouth as needed.     . furosemide (LASIX) 20 MG tablet Take 20 mg by mouth as needed for fluid.    Marland Kitchen glimepiride (AMARYL) 2 MG tablet Take 2 mg by mouth in the morning and at bedtime.    Marland Kitchen icosapent Ethyl (VASCEPA) 1 g capsule Take 2 capsules (2 g total) by mouth 2 (two) times daily. 120 capsule 6  . isosorbide mononitrate (IMDUR) 60 MG 24 hr tablet TAKE ONE AND ONE-HALF TABLETS DAILY 135 tablet 3  . metoprolol tartrate (LOPRESSOR) 50 MG tablet TAKE ONE TABLET TWICE DAILY 180 tablet 3  . nitroGLYCERIN (NITROSTAT) 0.4 MG SL tablet Place 1 tablet (0.4 mg total) under the tongue every 5 (five) minutes as needed for chest pain. Up to 3 doses 25 tablet 1  . pantoprazole (PROTONIX) 40 MG tablet TAKE ONE (1) TABLET EACH DAY 90 tablet 3  . rosuvastatin (CRESTOR) 20 MG tablet TAKE 1 TABLET BY MOUTH EVERY DAY 90 tablet 3   Current Facility-Administered Medications  Medication Dose Route Frequency Provider Last Rate Last Admin  . triamcinolone acetonide (KENALOG) 10 MG/ML injection 10 mg  10 mg Other Once Harriet Masson, DPM      . triamcinolone acetonide (KENALOG) 10 MG/ML injection 10 mg  10 mg Other Once Harriet Masson, DPM        Allergies:   Patient has no known allergies.    Social History:  The patient  reports that he quit smoking about 33 years ago. He has quit using smokeless tobacco. He reports that he does not drink alcohol and does not use drugs.   Family History:  The patient's family history includes CAD (age of onset: 84) in some other family members; Coronary artery disease (age of onset: 24) in his mother; Kidney cancer in his mother; Lung cancer in his brother and sister.    ROS:  Please see the history of present illness.   Otherwise, review of systems are positive for none.   All other systems are reviewed and negative.    PHYSICAL EXAM: VS:  BP (!) 142/70   Pulse 64   Ht 5\' 6"  (1.676 m)   Wt 169  lb 9.6 oz (76.9 kg)   BMI 27.37 kg/m  , BMI Body mass index is 27.37 kg/m. GEN: Well nourished, well developed, in no acute distress  HEENT: normal  Neck: no JVD, or masses.  Right carotid bruit Cardiac: RRR; no murmurs, rubs, or gallops,no edema  Respiratory:  clear to auscultation bilaterally, normal work of breathing GI: soft, nontender, nondistended, + BS MS: no deformity or atrophy  Skin: warm and dry, no rash Neuro:  Strength and sensation are intact Psych: euthymic mood, full affect Vascular: Femoral pulses +1 bilaterally.  Distal pulses are palpable on the right side  but not the left.  EKG:  EKG is not ordered today.    Recent Labs: 12/29/2019: ALT 11; BUN 15; Creatinine, Ser 1.48; Potassium 4.3; Sodium 139    Lipid Panel    Component Value Date/Time   CHOL 134 12/29/2019 0840   TRIG 363 (H) 12/29/2019 0840   HDL 24 (L) 12/29/2019 0840   CHOLHDL 5.6 (H) 12/29/2019 0840   CHOLHDL 4.4 01/11/2015 0820   VLDL 24 01/11/2015 0820   LDLCALC 54 12/29/2019 0840   LDLDIRECT 122.5 12/02/2013 0922      Wt Readings from Last 3 Encounters:  02/02/20 169 lb 9.6 oz (76.9 kg)  12/29/19 168 lb 6.4 oz (76.4 kg)  06/29/19 163 lb (73.9 kg)        No flowsheet data found.    ASSESSMENT AND PLAN:  1.  Peripheral arterial disease: Severe bilateral hip and thigh claudication due to severe common iliac artery disease bilaterally worse on the left side.  I discussed with him different management options and given severity of his symptoms, I recommend proceeding with abdominal aortogram with lower extremity runoff and possible endovascular intervention.  I discussed the procedure in details as well as risks and benefits. We will plan on using CO2 to minimize contrast.  Planned access is via the right common femoral artery.  2.  Coronary artery disease involving native coronary arteries.  Status post CABG and multiple PCI's.  No anginal symptoms at the present time.  3.   Essential hypertension: Blood pressure is reasonably controlled on current medications.  4.  Hyperlipidemia: Currently on rosuvastatin and Vascepa.  Most recent lipid profile showed an LDL of 54.  5.  Chronic kidney disease: We will bring him 4 hours early for hydration.  6.  Right carotid bruit: No previous evaluation.  I requested carotid Doppler.    Disposition:   FU with me in 1 month  Signed,  [name], MD  02/02/2020 1:07 PM    [place]

## 2020-02-02 NOTE — Patient Instructions (Signed)
Medication Instructions:  No changes *If you need a refill on your cardiac medications before your next appointment, please call your pharmacy*  Testing/Procedures: Your physician has requested that you have a peripheral vascular angiogram. This exam is performed at the hospital. During this exam IV contrast is used to look at arterial blood flow. Please review the information sheet given for details.  Your physician has requested that you have a carotid duplex. This test is an ultrasound of the carotid arteries in your neck. It looks at blood flow through these arteries that supply the brain with blood. Allow one hour for this exam. There are no restrictions or special instructions. This will take place at [place], [address].   Follow-Up: At [facility] you and your health needs are our priority.  As part of our continuing mission to provide you with exceptional heart care, we have created designated Provider Care Teams.  These Care Teams include your primary Cardiologist (physician) and Advanced Practice Providers (APPs -  Physician Assistants and Nurse Practitioners) who all work together to provide you with the care you need, when you need it.  We recommend signing up for the patient portal called "MyChart".  Sign up information is provided on this After Visit Summary.  MyChart is used to connect with patients for Virtual Visits (Telemedicine).  Patients are able to view lab/test results, encounter notes, upcoming appointments, etc.  Non-urgent messages can be sent to your provider as well.   To learn more about what you can do with MyChart, go to [email or web address].    Your next appointment:   1 month(s)  The format for your next appointment:   In Person  Provider:   [name], MD   Other Instructions    [place] [address] Dept:  [phone] Loc: [phone]  [name]  02/02/2020  You are scheduled for a Peripheral Angiogram on Wednesday, February 16 with Dr. [name].  1. Please arrive at the [facility] (Main Entrance A) at [facility]: [address] [place], [place] [address] at 6:30 AM (This time is 4 hours before your procedure to ensure your preparation). Free valet parking service is available.   Special note: Every effort is made to have your procedure done on time. Please understand that emergencies sometimes delay scheduled procedures.  2. Diet: Do not eat solid foods after midnight.  The patient may have clear liquids until 5am upon the day of the procedure.  3. Labs:  You will need to have the coronavirus test completed prior to your procedure. An appointment has been made at 10:10 am on 02/13/20. This is a Drive Up Visit at [address], [place], [place] [address]. Please tell them that you are there for procedure testing. Stay in your car and someone will be with you shortly. Please make sure to have all other labs completed before this test because you will need to stay quarantined until your procedure.  4. Medication instructions in preparation for your procedure: Hold the Glimepiride the morning of the procedure Hold all the diabetic medication the morning of the procedure Hold the Furosemide the morning of the procedure   On the morning of your procedure, take your Aspirin and Plavix/Clopidogrel and any morning medicines NOT listed above.  You may use sips of water.  5. Plan for one night stay--bring personal belongings. 6. Bring a current list of your  medications and current insurance cards. 7. You MUST have a responsible person to drive you home. 8. Someone MUST be with you the first 24 hours after you arrive home or your discharge will be delayed. 9. Please wear clothes that are easy to get on and off and wear slip-on shoes.  Thank you for allowing [place] to care for  you!   -- [facility]

## 2020-02-02 NOTE — H&P (View-Only) (Signed)
Cardiology Office Note   Date:  02/02/2020   ID:  [name], DOB 11-22-1945, MRN [id number]  PCP:  Allean Found, MD  Cardiologist: Dr. Burt Knack  No chief complaint on file.     History of Present Illness: [name] is a 75 y.o. male who was referred by Richardson Dopp for evaluation management of peripheral arterial disease. He has known history of coronary artery disease status post CABG in 1993 with multiple subsequent PCI's, chronic diastolic heart failure, chronic kidney disease, essential hypertension and hyperlipidemia. He reports bilateral hip and thigh discomfort with minimal walking especially if he is going uphill.  This started about 1 year ago with gradual worsening.  Symptoms happen with less than 1 block on flat level.  No lower extremity ulceration.  He frequently has cramps at rest as well.  Left side is worse on the right side. He underwent recent noninvasive vascular evaluation which showed an ABI of 1.08 on the right and 0.85 on the left.  Duplex showed severe bilateral common iliac artery disease worse on the left side.  He denies chest pain or worsening dyspnea.  He takes his medications regularly.  Past Medical History:  Diagnosis Date  . Anemia   . Blood transfusion   . CAD (coronary artery disease)    a. CABG 1993;  b. PCI of SVG to OM Jan 2012 (Drug-eluting stent x 2);  c. 09/2012 Cath/PCI: LM nl, LAD sev dzs, LCX 100, RCA patent stent, VG->AM 100, LIMA->LAD nl, VG->OM 95p ISR (PTCA only), patent distal stent. // d. LHC 3/18: pLAD 100, oRI 100, pLCx 100, Lat OM 95, RCA stent ok, S-PDA 100, L-LAD ok, S-OM1 stent 80 ISR >> PCI: POBA to S-OM1; DES to native Lat OM1  . CKD (chronic kidney disease), stage III ([place])   . Degeneration of lumbar or lumbosacral intervertebral disc   . GERD (gastroesophageal reflux disease)   . H/O hiatal hernia   . History of echocardiogram    Echo 3/18: Mild LVH, EF 60-65, Gr 2 DD  . History of nephrolithiasis   . Hyperlipidemia    . Hyperplastic colon polyp   . Hypertension   . Myocardial infarction (HCC)    hx of five   . PAD (peripheral artery disease) ([place])    LE Arterial US 1/22: R CIA > 50% prox and mid and < 50% distal; R ATA mid occlusion  . Stricture and stenosis of esophagus     Past Surgical History:  Procedure Laterality Date  . ANGIOPLASTY    . BACK SURGERY    . CARDIAC CATHETERIZATION    . COLONOSCOPY    . CORONARY ARTERY BYPASS GRAFT  1993  . CORONARY STENT INTERVENTION N/A 03/19/2016   Procedure: Coronary Stent Intervention;  Surgeon: Sherren Mocha, MD;  Location: [place] [facility];  Service: Cardiovascular;  Laterality: N/A;  . HOLMIUM LASER APPLICATION Right 06/17/735   Procedure: HOLMIUM LASER APPLICATION;  Surgeon: Jorja Loa, MD;  Location: [facility];  Service: Urology;  Laterality: Right;  . LEFT HEART CATH AND CORS/GRAFTS ANGIOGRAPHY N/A 03/19/2016   Procedure: Left Heart Cath and Cors/Grafts Angiography;  Surgeon: Sherren Mocha, MD;  Location: [place] [facility];  Service: Cardiovascular;  Laterality: N/A;  . LEFT HEART CATHETERIZATION WITH CORONARY/GRAFT ANGIOGRAM N/A 09/23/2012   Procedure: LEFT HEART CATHETERIZATION WITH Beatrix Fetters;  Surgeon: Peter M Martinique, MD;  Location: [facility];  Service: Cardiovascular;  Laterality: N/A;  . LITHOTRIPSY    . Lumbar  back    . NEPHROLITHOTOMY Right 09/17/2013   Procedure: NEPHROLITHOTOMY PERCUTANEOUS;  Surgeon: Jorja Loa, MD;  Location: [facility];  Service: Urology;  Laterality: Right;  . PERCUTANEOUS CORONARY INTERVENTION-BALLOON ONLY  09/23/2012   Procedure: PERCUTANEOUS CORONARY INTERVENTION-BALLOON ONLY;  Surgeon: Peter M Martinique, MD;  Location: [facility];  Service: Cardiovascular;;  OM vein graft     Current Outpatient Medications  Medication Sig Dispense Refill  . amLODipine (NORVASC) 5 MG tablet TAKE 1 TABLET BY MOUTH EVERY MORNING 90 tablet 3  . clopidogrel (PLAVIX) 75 MG tablet TAKE 1 TABLET BY MOUTH AT  BEDTIME 90 tablet 3  . ferrous sulfate 325 (65 FE) MG tablet Take 325 mg by mouth as needed.     . furosemide (LASIX) 20 MG tablet Take 20 mg by mouth as needed for fluid.    Marland Kitchen glimepiride (AMARYL) 2 MG tablet Take 2 mg by mouth in the morning and at bedtime.    Marland Kitchen icosapent Ethyl (VASCEPA) 1 g capsule Take 2 capsules (2 g total) by mouth 2 (two) times daily. 120 capsule 6  . isosorbide mononitrate (IMDUR) 60 MG 24 hr tablet TAKE ONE AND ONE-HALF TABLETS DAILY 135 tablet 3  . metoprolol tartrate (LOPRESSOR) 50 MG tablet TAKE ONE TABLET TWICE DAILY 180 tablet 3  . nitroGLYCERIN (NITROSTAT) 0.4 MG SL tablet Place 1 tablet (0.4 mg total) under the tongue every 5 (five) minutes as needed for chest pain. Up to 3 doses 25 tablet 1  . pantoprazole (PROTONIX) 40 MG tablet TAKE ONE (1) TABLET EACH DAY 90 tablet 3  . rosuvastatin (CRESTOR) 20 MG tablet TAKE 1 TABLET BY MOUTH EVERY DAY 90 tablet 3   Current Facility-Administered Medications  Medication Dose Route Frequency Provider Last Rate Last Admin  . triamcinolone acetonide (KENALOG) 10 MG/ML injection 10 mg  10 mg Other Once Harriet Masson, DPM      . triamcinolone acetonide (KENALOG) 10 MG/ML injection 10 mg  10 mg Other Once Harriet Masson, DPM        Allergies:   Patient has no known allergies.    Social History:  The patient  reports that he quit smoking about 33 years ago. He has quit using smokeless tobacco. He reports that he does not drink alcohol and does not use drugs.   Family History:  The patient's family history includes CAD (age of onset: 84) in some other family members; Coronary artery disease (age of onset: 24) in his mother; Kidney cancer in his mother; Lung cancer in his brother and sister.    ROS:  Please see the history of present illness.   Otherwise, review of systems are positive for none.   All other systems are reviewed and negative.    PHYSICAL EXAM: VS:  BP (!) 142/70   Pulse 64   Ht 5\' 6"  (1.676 m)   Wt 169  lb 9.6 oz (76.9 kg)   BMI 27.37 kg/m  , BMI Body mass index is 27.37 kg/m. GEN: Well nourished, well developed, in no acute distress  HEENT: normal  Neck: no JVD, or masses.  Right carotid bruit Cardiac: RRR; no murmurs, rubs, or gallops,no edema  Respiratory:  clear to auscultation bilaterally, normal work of breathing GI: soft, nontender, nondistended, + BS MS: no deformity or atrophy  Skin: warm and dry, no rash Neuro:  Strength and sensation are intact Psych: euthymic mood, full affect Vascular: Femoral pulses +1 bilaterally.  Distal pulses are palpable on the right side  but not the left.  EKG:  EKG is not ordered today.    Recent Labs: 12/29/2019: ALT 11; BUN 15; Creatinine, Ser 1.48; Potassium 4.3; Sodium 139    Lipid Panel    Component Value Date/Time   CHOL 134 12/29/2019 0840   TRIG 363 (H) 12/29/2019 0840   HDL 24 (L) 12/29/2019 0840   CHOLHDL 5.6 (H) 12/29/2019 0840   CHOLHDL 4.4 01/11/2015 0820   VLDL 24 01/11/2015 0820   LDLCALC 54 12/29/2019 0840   LDLDIRECT 122.5 12/02/2013 0922      Wt Readings from Last 3 Encounters:  02/02/20 169 lb 9.6 oz (76.9 kg)  12/29/19 168 lb 6.4 oz (76.4 kg)  06/29/19 163 lb (73.9 kg)        No flowsheet data found.    ASSESSMENT AND PLAN:  1.  Peripheral arterial disease: Severe bilateral hip and thigh claudication due to severe common iliac artery disease bilaterally worse on the left side.  I discussed with him different management options and given severity of his symptoms, I recommend proceeding with abdominal aortogram with lower extremity runoff and possible endovascular intervention.  I discussed the procedure in details as well as risks and benefits. We will plan on using CO2 to minimize contrast.  Planned access is via the right common femoral artery.  2.  Coronary artery disease involving native coronary arteries.  Status post CABG and multiple PCI's.  No anginal symptoms at the present time.  3.   Essential hypertension: Blood pressure is reasonably controlled on current medications.  4.  Hyperlipidemia: Currently on rosuvastatin and Vascepa.  Most recent lipid profile showed an LDL of 54.  5.  Chronic kidney disease: We will bring him 4 hours early for hydration.  6.  Right carotid bruit: No previous evaluation.  I requested carotid Doppler.    Disposition:   FU with me in 1 month  Signed,  [name], MD  02/02/2020 1:07 PM    [place]

## 2020-02-03 LAB — BASIC METABOLIC PANEL
BUN/Creatinine Ratio: 10 (ref 10–24)
BUN: 14 mg/dL (ref 8–27)
CO2: 23 mmol/L (ref 20–29)
Calcium: 8.8 mg/dL (ref 8.6–10.2)
Chloride: 100 mmol/L (ref 96–106)
Creatinine, Ser: 1.44 mg/dL — ABNORMAL HIGH (ref 0.76–1.27)
GFR calc Af Amer: 55 mL/min/{1.73_m2} — ABNORMAL LOW (ref >59)
GFR calc non Af Amer: 48 mL/min/{1.73_m2} — ABNORMAL LOW (ref >59)
Glucose: 367 mg/dL — ABNORMAL HIGH (ref 65–99)
Potassium: 4.6 mmol/L (ref 3.5–5.2)
Sodium: 137 mmol/L (ref 134–144)

## 2020-02-03 LAB — CBC
Hematocrit: 42.4 % (ref 37.5–51.0)
Hemoglobin: 14 g/dL (ref 13.0–17.7)
MCH: 29.9 pg (ref 26.6–33.0)
MCHC: 33 g/dL (ref 31.5–35.7)
MCV: 90 fL (ref 79–97)
Platelets: 188 10*3/uL (ref 150–450)
RBC: 4.69 x10E6/uL (ref 4.14–5.80)
RDW: 12.4 % (ref 11.6–15.4)
WBC: 6.2 10*3/uL (ref 3.4–10.8)

## 2020-02-04 DIAGNOSIS — N200 Calculus of kidney: Secondary | ICD-10-CM | POA: Diagnosis not present

## 2020-02-11 ENCOUNTER — Other Ambulatory Visit: Payer: Self-pay

## 2020-02-11 ENCOUNTER — Other Ambulatory Visit: Payer: Self-pay | Admitting: Cardiovascular Disease

## 2020-02-11 ENCOUNTER — Ambulatory Visit (HOSPITAL_COMMUNITY)
Admission: RE | Admit: 2020-02-11 | Discharge: 2020-02-11 | Disposition: A | Discharge status: Home / Self Care | Payer: Medicare Other | Source: Ambulatory Visit | Attending: Cardiovascular Disease | Admitting: Cardiovascular Disease

## 2020-02-11 DIAGNOSIS — R0989 Other specified symptoms and signs involving the circulatory and respiratory systems: Secondary | ICD-10-CM | POA: Diagnosis not present

## 2020-02-11 DIAGNOSIS — I6523 Occlusion and stenosis of bilateral carotid arteries: Secondary | ICD-10-CM

## 2020-02-13 ENCOUNTER — Other Ambulatory Visit (HOSPITAL_COMMUNITY)
Admission: RE | Admit: 2020-02-13 | Discharge: 2020-02-13 | Disposition: A | Discharge status: Home / Self Care | Payer: Medicare Other | Source: Ambulatory Visit | Attending: Cardiovascular Disease | Admitting: Cardiovascular Disease

## 2020-02-13 DIAGNOSIS — Z20822 Contact with and (suspected) exposure to covid-19: Secondary | ICD-10-CM | POA: Insufficient documentation

## 2020-02-13 DIAGNOSIS — Z01812 Encounter for preprocedural laboratory examination: Secondary | ICD-10-CM | POA: Diagnosis not present

## 2020-02-13 LAB — SARS CORONAVIRUS 2 (TAT 6-24 HRS): SARS Coronavirus 2: NEGATIVE

## 2020-02-16 ENCOUNTER — Telehealth: Payer: Self-pay | Admitting: *Deleted

## 2020-02-16 NOTE — Telephone Encounter (Signed)
Pt contacted pre-abdominal aortogram scheduled at [facility] for: Wednesday February 17, 2020 10:30 AM Verified arrival time and place: [facility]) at: 6:30 AM -pre-procedure hydration   No solid food after midnight prior to cath, clear liquids until 5 AM day of procedure.  Hold: Amaryl -AM of procedure  Lasix-day before and day -GFR 48  Except hold medications AM meds can be  taken pre-cath with sips of water including: ASA 81 mg Plavix 75 mg   Confirmed patient has responsible adult to drive home post procedure and be with patient first 24 hours after arriving home: yes  You are allowed ONE visitor in the waiting room during the time you are at the hospital for your procedure. Both you and your visitor must wear a mask once you enter the hospital.   Reviewed procedure/mask/visitor instructions with patient.

## 2020-02-17 ENCOUNTER — Other Ambulatory Visit: Payer: Self-pay | Admitting: *Deleted

## 2020-02-17 ENCOUNTER — Encounter (HOSPITAL_COMMUNITY)
Admission: RE | Disposition: A | Discharge status: Home / Self Care | Payer: Self-pay | Source: Home / Self Care | Attending: Cardiovascular Disease

## 2020-02-17 ENCOUNTER — Other Ambulatory Visit: Payer: Self-pay

## 2020-02-17 ENCOUNTER — Observation Stay (HOSPITAL_COMMUNITY)
Admission: RE | Admit: 2020-02-17 | Discharge: 2020-02-18 | Disposition: A | Discharge status: Home / Self Care | Payer: Medicare Other | Attending: Cardiovascular Disease | Admitting: Cardiovascular Disease

## 2020-02-17 DIAGNOSIS — R079 Chest pain, unspecified: Principal | ICD-10-CM | POA: Diagnosis present

## 2020-02-17 DIAGNOSIS — Z7901 Long term (current) use of anticoagulants: Secondary | ICD-10-CM | POA: Insufficient documentation

## 2020-02-17 DIAGNOSIS — I739 Peripheral vascular disease, unspecified: Secondary | ICD-10-CM

## 2020-02-17 DIAGNOSIS — Z951 Presence of aortocoronary bypass graft: Secondary | ICD-10-CM | POA: Diagnosis not present

## 2020-02-17 DIAGNOSIS — Z87891 Personal history of nicotine dependence: Secondary | ICD-10-CM | POA: Insufficient documentation

## 2020-02-17 DIAGNOSIS — I5032 Chronic diastolic (congestive) heart failure: Secondary | ICD-10-CM | POA: Diagnosis not present

## 2020-02-17 DIAGNOSIS — Z79899 Other long term (current) drug therapy: Secondary | ICD-10-CM | POA: Insufficient documentation

## 2020-02-17 DIAGNOSIS — I252 Old myocardial infarction: Secondary | ICD-10-CM | POA: Diagnosis not present

## 2020-02-17 DIAGNOSIS — I2510 Atherosclerotic heart disease of native coronary artery without angina pectoris: Secondary | ICD-10-CM | POA: Diagnosis present

## 2020-02-17 DIAGNOSIS — I25110 Atherosclerotic heart disease of native coronary artery with unstable angina pectoris: Secondary | ICD-10-CM | POA: Diagnosis present

## 2020-02-17 DIAGNOSIS — N183 Chronic kidney disease, stage 3 unspecified: Secondary | ICD-10-CM | POA: Insufficient documentation

## 2020-02-17 DIAGNOSIS — E785 Hyperlipidemia, unspecified: Secondary | ICD-10-CM | POA: Diagnosis not present

## 2020-02-17 DIAGNOSIS — I130 Hypertensive heart and chronic kidney disease with heart failure and stage 1 through stage 4 chronic kidney disease, or unspecified chronic kidney disease: Secondary | ICD-10-CM | POA: Insufficient documentation

## 2020-02-17 DIAGNOSIS — I10 Essential (primary) hypertension: Secondary | ICD-10-CM | POA: Diagnosis present

## 2020-02-17 DIAGNOSIS — I70213 Atherosclerosis of native arteries of extremities with intermittent claudication, bilateral legs: Secondary | ICD-10-CM | POA: Diagnosis not present

## 2020-02-17 HISTORY — PX: ABDOMINAL AORTOGRAM W/LOWER EXTREMITY: CATH118223

## 2020-02-17 HISTORY — PX: PERIPHERAL VASCULAR INTERVENTION: CATH118257

## 2020-02-17 LAB — TROPONIN I (HIGH SENSITIVITY)
Troponin I (High Sensitivity): 148 ng/L (ref <18)
Troponin I (High Sensitivity): 238 ng/L (ref <18)

## 2020-02-17 LAB — POCT ACTIVATED CLOTTING TIME: Activated Clotting Time: 291 seconds

## 2020-02-17 LAB — GLUCOSE, CAPILLARY
Glucose-Capillary: 223 mg/dL — ABNORMAL HIGH (ref 70–99)
Glucose-Capillary: 148 mg/dL — ABNORMAL HIGH (ref 70–99)

## 2020-02-17 SURGERY — ABDOMINAL AORTOGRAM W/LOWER EXTREMITY
Anesthesia: LOCAL

## 2020-02-17 MED ORDER — ASPIRIN 81 MG PO CHEW
81.0000 mg | CHEWABLE_TABLET | Freq: Once | ORAL | Status: AC
  Administered 2020-02-17: 81 mg via ORAL
  Filled 2020-02-17: qty 1

## 2020-02-17 MED ORDER — PANTOPRAZOLE SODIUM 40 MG PO TBEC
40.0000 mg | DELAYED_RELEASE_TABLET | Freq: Every day | ORAL | Status: DC
Start: 2020-02-17 — End: 2020-02-18
  Administered 2020-02-17 – 2020-02-18 (×2): 40 mg via ORAL
  Filled 2020-02-17 (×2): qty 1

## 2020-02-17 MED ORDER — NITROGLYCERIN 0.4 MG SL SUBL
0.4000 mg | SUBLINGUAL_TABLET | SUBLINGUAL | Status: DC | PRN
  Administered 2020-02-17 (×2): 0.4 mg via SUBLINGUAL
  Filled 2020-02-17: qty 1

## 2020-02-17 MED ORDER — AMLODIPINE BESYLATE 5 MG PO TABS
5.0000 mg | ORAL_TABLET | Freq: Every morning | ORAL | Status: DC
  Administered 2020-02-18: 5 mg via ORAL
  Filled 2020-02-17: qty 1

## 2020-02-17 MED ORDER — METOPROLOL TARTRATE 50 MG PO TABS
50.0000 mg | ORAL_TABLET | Freq: Two times a day (BID) | ORAL | Status: DC
  Administered 2020-02-17 – 2020-02-18 (×2): 50 mg via ORAL
  Filled 2020-02-17 (×2): qty 1

## 2020-02-17 MED ORDER — MIDAZOLAM HCL 2 MG/2ML IJ SOLN
INTRAMUSCULAR | Status: AC
  Filled 2020-02-17: qty 2

## 2020-02-17 MED ORDER — ACETAMINOPHEN 325 MG PO TABS: 650.0000 mg | ORAL_TABLET | ORAL | Status: DC | PRN

## 2020-02-17 MED ORDER — HEPARIN (PORCINE) IN NACL 1000-0.9 UT/500ML-% IV SOLN
INTRAVENOUS | Status: DC | PRN
  Administered 2020-02-17 (×2): 500 mL

## 2020-02-17 MED ORDER — CLOPIDOGREL BISULFATE 75 MG PO TABS: 75.0000 mg | ORAL_TABLET | Freq: Every day | ORAL | Status: DC

## 2020-02-17 MED ORDER — HEPARIN SODIUM (PORCINE) 1000 UNIT/ML IJ SOLN
INTRAMUSCULAR | Status: AC
  Filled 2020-02-17: qty 1

## 2020-02-17 MED ORDER — HYDRALAZINE HCL 20 MG/ML IJ SOLN: 5.0000 mg | INTRAMUSCULAR | Status: DC | PRN

## 2020-02-17 MED ORDER — HYDRALAZINE HCL 20 MG/ML IJ SOLN
INTRAMUSCULAR | Status: AC
  Filled 2020-02-17: qty 1

## 2020-02-17 MED ORDER — SODIUM CHLORIDE 0.9% FLUSH
3.0000 mL | Freq: Two times a day (BID) | INTRAVENOUS | Status: DC
  Administered 2020-02-18: 3 mL via INTRAVENOUS

## 2020-02-17 MED ORDER — SODIUM CHLORIDE 0.9% FLUSH: 3.0000 mL | INTRAVENOUS | Status: DC | PRN

## 2020-02-17 MED ORDER — SODIUM CHLORIDE 0.9 % IV SOLN: INTRAVENOUS | Status: DC

## 2020-02-17 MED ORDER — NITROGLYCERIN 0.4 MG SL SUBL
SUBLINGUAL_TABLET | SUBLINGUAL | Status: AC
  Filled 2020-02-17: qty 1

## 2020-02-17 MED ORDER — SODIUM CHLORIDE 0.9 % IV SOLN: 250.0000 mL | INTRAVENOUS | Status: DC | PRN

## 2020-02-17 MED ORDER — ASPIRIN 81 MG PO CHEW: 81.0000 mg | CHEWABLE_TABLET | ORAL | Status: DC

## 2020-02-17 MED ORDER — ROSUVASTATIN CALCIUM 20 MG PO TABS
20.0000 mg | ORAL_TABLET | Freq: Every day | ORAL | Status: DC
  Administered 2020-02-17 – 2020-02-18 (×2): 20 mg via ORAL
  Filled 2020-02-17 (×2): qty 1

## 2020-02-17 MED ORDER — LABETALOL HCL 5 MG/ML IV SOLN: 10.0000 mg | INTRAVENOUS | Status: DC | PRN

## 2020-02-17 MED ORDER — FENTANYL CITRATE (PF) 100 MCG/2ML IJ SOLN
INTRAMUSCULAR | Status: DC | PRN
  Administered 2020-02-17 (×2): 25 ug via INTRAVENOUS

## 2020-02-17 MED ORDER — SODIUM CHLORIDE 0.9 % IV SOLN: INTRAVENOUS | Status: AC

## 2020-02-17 MED ORDER — LIDOCAINE HCL (PF) 1 % IJ SOLN
INTRAMUSCULAR | Status: DC | PRN
  Administered 2020-02-17: 20 mL via INTRADERMAL

## 2020-02-17 MED ORDER — ICOSAPENT ETHYL 1 G PO CAPS
2.0000 g | ORAL_CAPSULE | Freq: Two times a day (BID) | ORAL | Status: DC
  Administered 2020-02-17 – 2020-02-18 (×2): 2 g via ORAL
  Filled 2020-02-17 (×3): qty 2

## 2020-02-17 MED ORDER — IODIXANOL 320 MG/ML IV SOLN
INTRAVENOUS | Status: DC | PRN
  Administered 2020-02-17: 85 mL via INTRA_ARTERIAL

## 2020-02-17 MED ORDER — HEPARIN SODIUM (PORCINE) 1000 UNIT/ML IJ SOLN
INTRAMUSCULAR | Status: DC | PRN
  Administered 2020-02-17: 6000 [IU] via INTRAVENOUS

## 2020-02-17 MED ORDER — HYDRALAZINE HCL 20 MG/ML IJ SOLN
INTRAMUSCULAR | Status: DC | PRN
  Administered 2020-02-17: 10 mg via INTRAVENOUS

## 2020-02-17 MED ORDER — LIDOCAINE HCL (PF) 1 % IJ SOLN
INTRAMUSCULAR | Status: AC
  Filled 2020-02-17: qty 30

## 2020-02-17 MED ORDER — NITROGLYCERIN 2 % TD OINT
1.0000 [in_us] | TOPICAL_OINTMENT | Freq: Four times a day (QID) | TRANSDERMAL | Status: DC
  Administered 2020-02-18 (×3): 1 [in_us] via TOPICAL
  Filled 2020-02-17: qty 30

## 2020-02-17 MED ORDER — FENTANYL CITRATE (PF) 100 MCG/2ML IJ SOLN
INTRAMUSCULAR | Status: AC
  Filled 2020-02-17: qty 2

## 2020-02-17 MED ORDER — MIDAZOLAM HCL 2 MG/2ML IJ SOLN
INTRAMUSCULAR | Status: DC | PRN
  Administered 2020-02-17: 1 mg via INTRAVENOUS

## 2020-02-17 MED ORDER — ONDANSETRON HCL 4 MG/2ML IJ SOLN
4.0000 mg | Freq: Four times a day (QID) | INTRAMUSCULAR | Status: DC | PRN
  Administered 2020-02-17: 4 mg via INTRAVENOUS
  Filled 2020-02-17: qty 2

## 2020-02-17 MED ORDER — HEPARIN (PORCINE) IN NACL 2000-0.9 UNIT/L-% IV SOLN
INTRAVENOUS | Status: AC
  Filled 2020-02-17: qty 1000

## 2020-02-17 SURGICAL SUPPLY — 21 items
BALLN MUSTANG 8X20X75 (BALLOONS) ×3
BALLOON MUSTANG 8X20X75 (BALLOONS) IMPLANT
CATH ANGIO 5F PIGTAIL 65CM (CATHETERS) ×1 IMPLANT
CLOSURE PERCLOSE PROSTYLE (VASCULAR PRODUCTS) ×2 IMPLANT
FILTER CO2 0.2 MICRON (VASCULAR PRODUCTS) ×3 IMPLANT
KIT ENCORE 26 ADVANTAGE (KITS) ×2 IMPLANT
KIT MICROPUNCTURE NIT STIFF (SHEATH) ×1 IMPLANT
KIT PV (KITS) ×3 IMPLANT
RESERVOIR CO2 (VASCULAR PRODUCTS) ×1 IMPLANT
SET FLUSH CO2 (MISCELLANEOUS) ×1 IMPLANT
SHEATH BRITE TIP 7FR 35CM (SHEATH) ×2 IMPLANT
SHEATH PINNACLE 5F 10CM (SHEATH) ×1 IMPLANT
STENT EXPRESS LD 8X27X75 (Permanent Stent) ×1 IMPLANT
STENT EXPRESS LD 8X57X75 (Permanent Stent) ×3 IMPLANT
STENT INNOVA 8X20X130 (Permanent Stent) ×1 IMPLANT
SYR MEDRAD MARK 7 150ML (SYRINGE) ×3 IMPLANT
TRANSDUCER W/STOPCOCK (MISCELLANEOUS) ×3 IMPLANT
TRAY PV CATH (CUSTOM PROCEDURE TRAY) ×3 IMPLANT
TUBING CIL FLEX 10 FLL-RA (TUBING) ×3 IMPLANT
WIRE BENTSON .035X145CM (WIRE) ×1 IMPLANT
WIRE HI TORQ VERSACORE J 260CM (WIRE) ×2 IMPLANT

## 2020-02-17 NOTE — Progress Notes (Signed)
Dr Fletcher Anon in; Report called to RN for 3-E-22 and transferred to 3-E-22 via bed

## 2020-02-17 NOTE — Progress Notes (Signed)
Client c/o 8/10 left arm pain and right arm pain and states it feels like his heart pain; Lindsey,PA notified; EKG done and ntg given

## 2020-02-17 NOTE — Progress Notes (Signed)
States left arm pain decreased to 5/10

## 2020-02-17 NOTE — Interval H&P Note (Signed)
History and Physical Interval Note:  02/17/2020 10:58 AM  [name]  has presented today for surgery, with the diagnosis of claudication.  The various methods of treatment have been discussed with the patient and family. After consideration of risks, benefits and other options for treatment, the patient has consented to  Procedure(s): ABDOMINAL AORTOGRAM W/LOWER EXTREMITY (N/A) as a surgical intervention.  The patient's history has been reviewed, patient examined, no change in status, stable for surgery.  I have reviewed the patient's chart and labs.  Questions were answered to the patient's satisfaction.     [name]

## 2020-02-17 NOTE — Progress Notes (Signed)
Nausea and vomited 300cc partially digested food; then c/o 8/10 left arm and chest pain; oxygen started at 4l/min; ntg given; pain decreased to 5/10 and second ntg given and client states complete relief from pain; vomited again 300cc partially digested food ; zofran given

## 2020-02-17 NOTE — Progress Notes (Signed)
   Called by RN that patient developed chest pain with radiation into the left arm with vomiting after getting back to short stay and eating. Underwent successful bilateral iliac stenting. Became hypertensive. EKG done showed SR 89bpm with nonspecific changes in lateral leads. Given 2 SL nitro with resolution of chest pain, as well as zofran for vomiting. Was feeling better after medications.   Follow up call with patient having recurrent chest pain, EKG again showed no acute ischemia. Ordered for additional SL nitro. Bilateral groin sites stable. Dr. Fletcher Anon to see the patient.   Will plan to admit given recurrence of symptoms.  -- cycle troponins -- place nitro paste  Signed, Reino Bellis, NP-C 02/17/2020, 5:12 PM Pager: [phone]

## 2020-02-18 ENCOUNTER — Encounter (HOSPITAL_COMMUNITY): Payer: Self-pay | Admitting: Cardiovascular Disease

## 2020-02-18 ENCOUNTER — Other Ambulatory Visit: Payer: Self-pay | Admitting: Cardiovascular Disease

## 2020-02-18 DIAGNOSIS — I2510 Atherosclerotic heart disease of native coronary artery without angina pectoris: Secondary | ICD-10-CM

## 2020-02-18 DIAGNOSIS — I5032 Chronic diastolic (congestive) heart failure: Secondary | ICD-10-CM | POA: Diagnosis not present

## 2020-02-18 DIAGNOSIS — I208 Other forms of angina pectoris: Secondary | ICD-10-CM | POA: Diagnosis not present

## 2020-02-18 DIAGNOSIS — I739 Peripheral vascular disease, unspecified: Secondary | ICD-10-CM | POA: Diagnosis not present

## 2020-02-18 DIAGNOSIS — E785 Hyperlipidemia, unspecified: Secondary | ICD-10-CM | POA: Diagnosis not present

## 2020-02-18 DIAGNOSIS — R079 Chest pain, unspecified: Secondary | ICD-10-CM | POA: Diagnosis not present

## 2020-02-18 DIAGNOSIS — I130 Hypertensive heart and chronic kidney disease with heart failure and stage 1 through stage 4 chronic kidney disease, or unspecified chronic kidney disease: Secondary | ICD-10-CM | POA: Diagnosis not present

## 2020-02-18 LAB — GLUCOSE, CAPILLARY
Glucose-Capillary: 224 mg/dL — ABNORMAL HIGH (ref 70–99)
Glucose-Capillary: 288 mg/dL — ABNORMAL HIGH (ref 70–99)

## 2020-02-18 MED ORDER — ASPIRIN 81 MG PO CHEW: 81.0000 mg | CHEWABLE_TABLET | Freq: Every day | ORAL | 0 refills | Status: DC

## 2020-02-18 MED ORDER — ASPIRIN EC 81 MG PO TBEC
81.0000 mg | DELAYED_RELEASE_TABLET | Freq: Every day | ORAL | Status: DC
  Administered 2020-02-18: 81 mg via ORAL
  Filled 2020-02-18: qty 1

## 2020-02-18 NOTE — Discharge Instructions (Signed)

## 2020-02-18 NOTE — Progress Notes (Signed)
Progress Note  Patient Name: Keyen Marban Date of Encounter: 02/18/2020  [facility] Cardiologist: Sherren Mocha, MD  [facility] cardiologist: Dr. Annia Belt  Subjective   Mr. Fieldhouse had bilateral iliac PTA and balloon expandable stenting yesterday because of lifestyle limiting claudication by Dr. Fletcher Anon with an excellent angiographic result.  He had perclosure of both groins which were stable this morning.  The patient did have an episode of chest pain last night which resolved with sublingual nitroglycerin.  He is stable for discharge  Inpatient Medications    Scheduled Meds: . amLODipine  5 mg Oral q morning  . aspirin EC  81 mg Oral Daily  . clopidogrel  75 mg Oral QHS  . icosapent Ethyl  2 g Oral BID  . metoprolol tartrate  50 mg Oral BID  . nitroGLYCERIN  1 inch Topical Q6H  . pantoprazole  40 mg Oral Daily  . rosuvastatin  20 mg Oral Daily  . sodium chloride flush  3 mL Intravenous Q12H   Continuous Infusions: . sodium chloride    . sodium chloride     PRN Meds: sodium chloride, acetaminophen, hydrALAZINE, labetalol, nitroGLYCERIN, ondansetron (ZOFRAN) IV, sodium chloride flush   Vital Signs    Vitals:   02/18/20 0422 02/18/20 0427 02/18/20 0806 02/18/20 1118  BP:  138/83 (!) 154/75 134/72  Pulse:  75 74 70  Resp:  18 18 18   Temp:  98.3 F (36.8 C) 98.7 F (37.1 C) 98.5 F (36.9 C)  TempSrc:  Oral  Oral  SpO2:  97% 96% 95%  Weight: 74.6 kg     Height:        Intake/Output Summary (Last 24 hours) at 02/18/2020 1207 Last data filed at 02/18/2020 0844 Gross per 24 hour  Intake 843 ml  Output 600 ml  Net 243 ml   Last 3 Weights 02/18/2020 02/17/2020 02/02/2020  Weight (lbs) 164 lb 6.4 oz 166 lb 169 lb 9.6 oz  Weight (kg) 74.571 kg 75.297 kg 76.93 kg      Telemetry    Sinus rhythm- Personally Reviewed  ECG    Not performed today- Personally Reviewed  Physical Exam   GEN: No acute distress.   Neck: No JVD Cardiac: RRR, no murmurs,  rubs, or gallops.  Respiratory: Clear to auscultation bilaterally. GI: Soft, nontender, non-distended  MS: No edema; No deformity. Neuro:  Nonfocal  Psych: Normal affect  Extremities: Both femoral puncture sites appear stable.  Labs    High Sensitivity Troponin:   Recent Labs  Lab 02/17/20 2002 02/17/20 2130  TROPONINIHS 148* 238*      ChemistryNo results for input(s): NA, K, CL, CO2, GLUCOSE, BUN, CREATININE, CALCIUM, PROT, ALBUMIN, AST, ALT, ALKPHOS, BILITOT, GFRNONAA, GFRAA, ANIONGAP in the last 168 hours.   HematologyNo results for input(s): WBC, RBC, HGB, HCT, MCV, MCH, MCHC, RDW, PLT in the last 168 hours.  BNPNo results for input(s): BNP, PROBNP in the last 168 hours.   DDimer No results for input(s): DDIMER in the last 168 hours.   Radiology    PERIPHERAL VASCULAR CATHETERIZATION  Result Date: 02/17/2020 1.  Significant distal aortic ulcerated stenosis extending into the ostium of both common iliac arteries with severe stenosis in the distal left common iliac artery. 2.  Successful bilateral kissing common iliac artery stent placement extending into the distal aorta with an additional overlapped self-expanding stent on the left side to cover the whole lesion. Recommendations: The patient is already on dual antiplatelet therapy.  Continue aggressive treatment of risk factors.   Cardiac Studies   Peripheral angiography/intervention (02/17/2020)  Conclusion  1.  Significant distal aortic ulcerated stenosis extending into the ostium of both common iliac arteries with severe stenosis in the distal left common iliac artery. 2.  Successful bilateral kissing common iliac artery stent placement extending into the distal aorta with an additional overlapped self-expanding stent on the left side to cover the whole lesion.  Recommendations: The patient is already on dual antiplatelet therapy. Continue aggressive treatment of risk factors.   Patient Profile     Jovannie Ulibarri  is a 75 y.o. male who was referred by Richardson Dopp for evaluation management of peripheral arterial disease. He has known history of coronary artery disease status post CABG in 1993 with multiple subsequent PCI's, chronic diastolic heart failure, chronic kidney disease, essential hypertension and hyperlipidemia. He reports bilateral hip and thigh discomfort with minimal walking especially if he is going uphill.  This started about 1 year ago with gradual worsening.  Symptoms happen with less than 1 block on flat level.  No lower extremity ulceration.  He frequently has cramps at rest as well.  Left side is worse on the right side. He underwent recent noninvasive vascular evaluation which showed an ABI of 1.08 on the right and 0.85 on the left.  Duplex showed severe bilateral common iliac artery disease worse on the left side.  Assessment & Plan    1: Peripheral artery disease-postop day 1 bilateral iliac "kissing stent" intervention extending into the aorta for left element claudication.  He is on aspirin Plavix.  He had excellent result.  His femoral puncture sites were both hemostatically sealed with Perclose devices.  Follow-up Dopplers have been already arranged by Dr. Fletcher Anon.  2: Elevated troponin-history of CAD.  He did have an episode of chest pain with mild increase in his troponin which resolved with several nitroglycerin.  This was associated with hypertension.  I suspect it was demand ischemia.  Has had no further symptoms.  I discussed this with Dr. Velva Harman and we both feel comfortable sending him home today.  For questions or updates, please contact [place] Please consult [email or web address] for contact info under        Signed, Quay Burow, MD  02/18/2020, 12:07 PM

## 2020-02-18 NOTE — Plan of Care (Signed)
  Problem: Education: Goal: Knowledge of General Education information will improve Description: Including pain rating scale, medication(s)/side effects and non-pharmacologic comfort measures Outcome: Adequate for Discharge   

## 2020-02-18 NOTE — Discharge Summary (Addendum)
Discharge Summary    Patient ID: [name] MRN: [id number]; DOB: 1945-10-08  Admit date: 02/17/2020 Discharge date: 02/18/2020  PCP:  Allean Found, MD   [place]  Cardiologist:  Sherren Mocha, MD; Dr. Fletcher Anon ([place]) Advanced Practice Provider:  No care team member to display Electrophysiologist:  None   Discharge Diagnoses    Principal Problem:   Chest pain Active Problems:   Hyperlipidemia   Essential hypertension   Coronary artery disease involving native coronary artery of native heart without angina pectoris   PAD (peripheral artery disease) ([place])    Diagnostic Studies/Procedures    Abdominal aortogram with LE 02/18/20: 1.  Significant distal aortic ulcerated stenosis extending into the ostium of both common iliac arteries with severe stenosis in the distal left common iliac artery. 2.  Successful bilateral kissing common iliac artery stent placement extending into the distal aorta with an additional overlapped self-expanding stent on the left side to cover the whole lesion.   Recommendations: The patient is already on dual antiplatelet therapy. Continue aggressive treatment of risk factors  _____________   History of Present Illness     [name] is a 75 y.o. male with a PMH of CAD s/p CABG in 1993 with multiple subsequent PCI's, chronic diastolic CHF, HTN, HLD, PAD, and CKD stage .  Hospital Course     Consultants: None   1. Chest pain in patient with CAD s/p CABG with multiple subsequent PCI's: patient reported chest pain following outpatient abdominal aortogram with LE. He was given SL nitro with improvement in symptoms. EKG was non-ischemic. HsTrop [id number]. He was monitored overnight without recurrent symptoms. Felt to possibly be 2/2 elevated blood pressures which also improved with resuming home medications. Deemed stable for discharge home by Dr. Gwenlyn Found on 02/18/20.  - Continue aspirin and plavix - Continue statin - Continue  metoprolol, amlodipine, and imdur  2. PAD: patient presented for outpatient aortogram and underwent successful bilateral kissing common illiac artery stent in the distal aorta with additional overlapping self-expanding stents on the left side for management of aortic ulcerated stenosis extending into the ostium of both common iliac arteries and severe stenosis in the distal left common iliac artery. He was recommended to continue DAPT with aspirin and plavix - Continue aspirin and plavix - Continue statin - He is scheduled for repeat dopplers 03/04/20 for close monitoring.    3. Chronic diastolic CHF: maintained euvolemic status this admission - Resume home lasix at discharge - Continue metoprolol  4. HTN: BP improved with resuming home medications - Continue amlodipine, metoprolol, and lasix  5. HLD: LDL 54 12/2019 - Continue crestor and vascepa  Did the patient have an acute coronary syndrome (MI, NSTEMI, STEMI, etc) this admission?:  No                               Did the patient have a percutaneous coronary intervention (stent / angioplasty)?:  No.       _____________  Discharge Vitals Blood pressure 134/72, pulse 70, temperature 98.5 F (36.9 C), temperature source Oral, resp. rate 18, height 5\' 6"  (1.676 m), weight 74.6 kg, SpO2 95 %.  Filed Weights   02/17/20 0710 02/18/20 0422  Weight: 75.3 kg 74.6 kg    Labs & Radiologic Studies    CBC No results for input(s): WBC, NEUTROABS, HGB, HCT, MCV, PLT in the last 72 hours. Basic Metabolic Panel No results for input(s): NA,  K, CL, CO2, GLUCOSE, BUN, CREATININE, CALCIUM, MG, PHOS in the last 72 hours. Liver Function Tests No results for input(s): AST, ALT, ALKPHOS, BILITOT, PROT, ALBUMIN in the last 72 hours. No results for input(s): LIPASE, AMYLASE in the last 72 hours. High Sensitivity Troponin:   Recent Labs  Lab 02/17/20 2002 02/17/20 2130  TROPONINIHS 148* 238*    BNP Invalid input(s): POCBNP D-Dimer No results  for input(s): DDIMER in the last 72 hours. Hemoglobin A1C No results for input(s): HGBA1C in the last 72 hours. Fasting Lipid Panel No results for input(s): CHOL, HDL, LDLCALC, TRIG, CHOLHDL, LDLDIRECT in the last 72 hours. Thyroid Function Tests No results for input(s): TSH, T4TOTAL, T3FREE, THYROIDAB in the last 72 hours.  Invalid input(s): FREET3 _____________  PERIPHERAL VASCULAR CATHETERIZATION  Result Date: 02/17/2020 1.  Significant distal aortic ulcerated stenosis extending into the ostium of both common iliac arteries with severe stenosis in the distal left common iliac artery. 2.  Successful bilateral kissing common iliac artery stent placement extending into the distal aorta with an additional overlapped self-expanding stent on the left side to cover the whole lesion. Recommendations: The patient is already on dual antiplatelet therapy. Continue aggressive treatment of risk factors.  VAS US CAROTID  Result Date: 02/12/2020 Carotid Arterial Duplex Study Indications:       Right bruit and patient reports having intermittent left                    posterolateral neck pain, occasional blurred vision of the                    left eye and dizziness when bending over or getting up too                    quickly. He denies any other cerebrovascular symptoms. Risk Factors:      Hypertension, hyperlipidemia, past history of smoking,                    coronary artery disease, PAD. Comparison Study:  NA Performing Technologist: Sharlett Iles RVT  Examination Guidelines: A complete evaluation includes B-mode imaging, spectral Doppler, color Doppler, and power Doppler as needed of all accessible portions of each vessel. Bilateral testing is considered an integral part of a complete examination. Limited examinations for reoccurring indications may be performed as noted.  Right Carotid Findings: +----------+--------+--------+--------+------------------+--------+           PSV cm/sEDV  cm/sStenosisPlaque DescriptionComments +----------+--------+--------+--------+------------------+--------+ CCA Prox  68      11                                         +----------+--------+--------+--------+------------------+--------+ CCA Distal97      16              heterogenous               +----------+--------+--------+--------+------------------+--------+ ICA Prox  157     48      40-59%  heterogenous               +----------+--------+--------+--------+------------------+--------+ ICA Mid   153     42                                         +----------+--------+--------+--------+------------------+--------+  ICA Distal61      13                                         +----------+--------+--------+--------+------------------+--------+ ECA       178     7               heterogenous               +----------+--------+--------+--------+------------------+--------+ +----------+--------+-------+----------------+-------------------+           PSV cm/sEDV cmsDescribe        Arm Pressure (mmHG) +----------+--------+-------+----------------+-------------------+ [id number]             Multiphasic, [id number]                 +----------+--------+-------+----------------+-------------------+ +---------+--------+--+--------+--+---------+ VertebralPSV cm/s46EDV cm/s14Antegrade +---------+--------+--+--------+--+---------+  Left Carotid Findings: +----------+--------+--------+--------+--------------------------+---------+           PSV cm/sEDV cm/sStenosisPlaque Description        Comments  +----------+--------+--------+--------+--------------------------+---------+ CCA Prox  74      11                                                  +----------+--------+--------+--------+--------------------------+---------+ CCA Mid   71      9               heterogenous                         +----------+--------+--------+--------+--------------------------+---------+ CCA Distal70      12              heterogenous                        +----------+--------+--------+--------+--------------------------+---------+ ICA Prox  207     58      40-59%  heterogenous and irregularShadowing +----------+--------+--------+--------+--------------------------+---------+ ICA Mid   144     27              heterogenous                        +----------+--------+--------+--------+--------------------------+---------+ ICA Distal60      13                                                  +----------+--------+--------+--------+--------------------------+---------+ ECA       117     10              heterogenous                        +----------+--------+--------+--------+--------------------------+---------+ +----------+--------+--------+---------+-------------------+           PSV cm/sEDV cm/sDescribe Arm Pressure (mmHG) +----------+--------+--------+---------+-------------------+ [id number]             Turbulent125                 +----------+--------+--------+---------+-------------------+ +---------+--------+--+--------+--+---------+ VertebralPSV cm/s29EDV cm/s10Antegrade +---------+--------+--+--------+--+---------+   Summary: Right Carotid: Velocities in the right ICA are consistent with a 40-59%  stenosis. Non-hemodynamically significant plaque <50% noted in                the CCA. Left Carotid: Velocities in the left ICA are consistent with a 40-59% stenosis.               Non-hemodynamically significant plaque <50% noted in the CCA. Vertebrals:  Bilateral vertebral arteries demonstrate antegrade flow. Subclavians: Left subclavian artery flow was disturbed. Normal flow hemodynamics              were seen in the right subclavian artery. *See table(s) above for measurements and observations. Suggest follow up study in 12 months. Electronically  signed by [name] MD on 02/12/2020 at 12:07:38 PM.    Final    Disposition   Pt is being discharged home today in good condition.  Follow-up Plans & Appointments     Follow-up Information     Allean Found, MD. Go on 02/25/2020.   Specialty: Family Medicine Why: @10 :40am Contact information: [address] [place] [address] [phone]         Wellington Hampshire, MD Follow up on 03/01/2020.   Specialty: Cardiology Why: Please arrive 15 minutes early for your 9:20am post-hospital cardiology appointment. Contact information: [address] [place] [address] [phone]                   Discharge Medications   Allergies as of 02/18/2020   No Known Allergies      Medication List     TAKE these medications    acetaminophen 500 MG tablet Commonly known as: TYLENOL Take 500 mg by mouth every 6 (six) hours as needed (pain.).   amLODipine 5 MG tablet Commonly known as: NORVASC TAKE 1 TABLET BY MOUTH EVERY MORNING What changed: when to take this   aspirin 81 MG chewable tablet Chew 1 tablet (81 mg total) by mouth daily. What changed:  how much to take when to take this   clopidogrel 75 MG tablet Commonly known as: PLAVIX TAKE 1 TABLET BY MOUTH AT BEDTIME   furosemide 20 MG tablet Commonly known as: LASIX Take 20 mg by mouth daily as needed for fluid.   glimepiride 2 MG tablet Commonly known as: AMARYL Take 2 mg by mouth in the morning and at bedtime.   ibuprofen 200 MG tablet Commonly known as: ADVIL Take 200 mg by mouth every 8 (eight) hours as needed (pain).   icosapent Ethyl 1 g capsule Commonly known as: Vascepa Take 2 capsules (2 g total) by mouth 2 (two) times daily.   isosorbide mononitrate 60 MG 24 hr tablet Commonly known as: IMDUR TAKE ONE AND ONE-HALF TABLETS DAILY   metoprolol tartrate 50 MG tablet Commonly known as: LOPRESSOR TAKE ONE TABLET TWICE DAILY   nitroGLYCERIN 0.4 MG SL  tablet Commonly known as: NITROSTAT Place 1 tablet (0.4 mg total) under the tongue every 5 (five) minutes as needed for chest pain. Up to 3 doses What changed:  when to take this additional instructions   pantoprazole 40 MG tablet Commonly known as: PROTONIX TAKE ONE (1) TABLET EACH DAY What changed: See the new instructions.   rosuvastatin 20 MG tablet Commonly known as: CRESTOR TAKE 1 TABLET BY MOUTH EVERY DAY What changed: when to take this           Outstanding Labs/Studies   Repeat Aorta/IVC/Iliacs and ABI's scheduled for 03/04/20  Duration of Discharge Encounter   Greater than 30  minutes including physician time.  Signed, Abigail Butts, PA-C 02/18/2020, 12:42 PM  Agree with assessment and plan by Roby Lofts PA-C  Mr Block was admitted for OP PV angio by Dr Fletcher Anon. He is a cardiology pt of Dr Antionette Char with known CAD. He had 'kissing stents' of the iliac bifurcation with an excellent result. Both groins were sealed with perclose. Th ept did develop some CP and HTN post procedure with a small troponin leak probably from demand ischemia. Pain resolved with NTG and he remained pain free after this. His exam was benign. Groins were stable. He was deemed stable for DC home. OP F/U with Dr Fletcher Anon was arranged.  Lorretta Harp, M.D., [place], [facility], [place] [address] [place], [place]  [address]  [phone] 02/18/2020 5:21 PM

## 2020-02-18 NOTE — Telephone Encounter (Signed)
Was this meant for in patient, it was sent to [facility] pharmacy?

## 2020-02-18 NOTE — Progress Notes (Signed)
D/C instructions given and reviewed. No questions asked but encouraged to call with any concerns. Tele removed. Waiting on wife to transport home.

## 2020-02-25 DIAGNOSIS — I129 Hypertensive chronic kidney disease with stage 1 through stage 4 chronic kidney disease, or unspecified chronic kidney disease: Secondary | ICD-10-CM | POA: Diagnosis not present

## 2020-02-25 DIAGNOSIS — N183 Chronic kidney disease, stage 3 unspecified: Secondary | ICD-10-CM | POA: Diagnosis not present

## 2020-02-25 DIAGNOSIS — I252 Old myocardial infarction: Secondary | ICD-10-CM | POA: Diagnosis not present

## 2020-02-25 DIAGNOSIS — E1122 Type 2 diabetes mellitus with diabetic chronic kidney disease: Secondary | ICD-10-CM | POA: Diagnosis not present

## 2020-02-25 DIAGNOSIS — E785 Hyperlipidemia, unspecified: Secondary | ICD-10-CM | POA: Diagnosis not present

## 2020-02-25 DIAGNOSIS — E1165 Type 2 diabetes mellitus with hyperglycemia: Secondary | ICD-10-CM | POA: Diagnosis not present

## 2020-02-25 DIAGNOSIS — I10 Essential (primary) hypertension: Secondary | ICD-10-CM | POA: Diagnosis not present

## 2020-02-25 DIAGNOSIS — I208 Other forms of angina pectoris: Secondary | ICD-10-CM | POA: Diagnosis not present

## 2020-03-01 ENCOUNTER — Ambulatory Visit (INDEPENDENT_AMBULATORY_CARE_PROVIDER_SITE_OTHER): Payer: Medicare Other | Admitting: Cardiovascular Disease

## 2020-03-01 ENCOUNTER — Encounter: Payer: Self-pay | Admitting: Cardiovascular Disease

## 2020-03-01 ENCOUNTER — Other Ambulatory Visit: Payer: Self-pay

## 2020-03-01 VITALS — BP 140/70 | HR 58 | Ht 66.0 in | Wt 168.2 lb

## 2020-03-01 DIAGNOSIS — I10 Essential (primary) hypertension: Secondary | ICD-10-CM | POA: Diagnosis not present

## 2020-03-01 DIAGNOSIS — E785 Hyperlipidemia, unspecified: Secondary | ICD-10-CM

## 2020-03-01 DIAGNOSIS — I739 Peripheral vascular disease, unspecified: Secondary | ICD-10-CM

## 2020-03-01 DIAGNOSIS — R0989 Other specified symptoms and signs involving the circulatory and respiratory systems: Secondary | ICD-10-CM | POA: Diagnosis not present

## 2020-03-01 DIAGNOSIS — I2510 Atherosclerotic heart disease of native coronary artery without angina pectoris: Secondary | ICD-10-CM

## 2020-03-01 DIAGNOSIS — I6523 Occlusion and stenosis of bilateral carotid arteries: Secondary | ICD-10-CM

## 2020-03-01 LAB — LIPID PANEL
Chol/HDL Ratio: 4.7 ratio (ref 0.0–5.0)
Cholesterol, Total: 123 mg/dL (ref 100–199)
HDL: 26 mg/dL — ABNORMAL LOW (ref >39)
LDL Chol Calc (NIH): 69 mg/dL (ref 0–99)
Triglycerides: 161 mg/dL — ABNORMAL HIGH (ref 0–149)
VLDL Cholesterol Cal: 28 mg/dL (ref 5–40)

## 2020-03-01 LAB — HEPATIC FUNCTION PANEL
ALT: 9 IU/L (ref 0–44)
AST: 12 IU/L (ref 0–40)
Albumin: 4.1 g/dL (ref 3.7–4.7)
Alkaline Phosphatase: 91 IU/L (ref 44–121)
Bilirubin Total: 0.3 mg/dL (ref 0.0–1.2)
Bilirubin, Direct: <0.1 mg/dL (ref 0.00–0.40)
Total Protein: 6.9 g/dL (ref 6.0–8.5)

## 2020-03-01 NOTE — Patient Instructions (Signed)
Medication Instructions:  No changes *If you need a refill on your cardiac medications before your next appointment, please call your pharmacy*   Lab Work: Your provider would like for you to have the following labs: lipid and liver on the day of your duplex.  If you have labs (blood work) drawn today and your tests are completely normal, you will receive your results only by: Marland Kitchen MyChart Message (if you have MyChart) OR . A paper copy in the mail If you have any lab test that is abnormal or we need to change your treatment, we will call you to review the results.   Testing/Procedures: None ordered   Follow-Up: At [facility], you and your health needs are our priority.  As part of our continuing mission to provide you with exceptional heart care, we have created designated Provider Care Teams.  These Care Teams include your primary Cardiologist (physician) and Advanced Practice Providers (APPs -  Physician Assistants and Nurse Practitioners) who all work together to provide you with the care you need, when you need it.  We recommend signing up for the patient portal called "MyChart".  Sign up information is provided on this After Visit Summary.  MyChart is used to connect with patients for Virtual Visits (Telemedicine).  Patients are able to view lab/test results, encounter notes, upcoming appointments, etc.  Non-urgent messages can be sent to your provider as well.   To learn more about what you can do with MyChart, go to [email or web address].    Your next appointment:   6 month(s)  The format for your next appointment:   In Person  Provider:   [name], MD

## 2020-03-01 NOTE — Progress Notes (Signed)
Cardiology Office Note   Date:  03/01/2020   ID:  [name], DOB 10-20-1945, MRN [id number]  PCP:  Allean Found, MD  Cardiologist: Dr. Burt Knack  No chief complaint on file.     History of Present Illness: [name] is a 75 y.o. male who is here today for follow-up visit regarding peripheral arterial disease.   He has known history of coronary artery disease status post CABG in 1993 with multiple subsequent PCI's, chronic diastolic heart failure, chronic kidney disease, essential hypertension and hyperlipidemia.  He was seen recently for severe bilateral hip and thigh claudication. He underwent recent noninvasive vascular evaluation which showed an ABI of 1.08 on the right and 0.85 on the left.  Duplex showed severe bilateral common iliac artery disease worse on the left side. I proceeded with angiography recently which showed significant distal aortic ulcerated stenosis extending into the ostium of both common iliac arteries with severe stenosis in the distal left common iliac artery.  I performed successful bilateral kissing common iliac artery stent placement extending into the distal aorta with an additional overlap self-expanding stent on the left side.  He had elevated blood pressure and chest pain after the procedure with mildly elevated troponin.  He was observed overnight with resolution of symptoms. He reports feeling great overall with complete resolution of claudication.  No recurrent chest pain.   Past Medical History:  Diagnosis Date  . Anemia   . Blood transfusion   . CAD (coronary artery disease)    a. CABG 1993;  b. PCI of SVG to OM Jan 2012 (Drug-eluting stent x 2);  c. 09/2012 Cath/PCI: LM nl, LAD sev dzs, LCX 100, RCA patent stent, VG->AM 100, LIMA->LAD nl, VG->OM 95p ISR (PTCA only), patent distal stent. // d. LHC 3/18: pLAD 100, oRI 100, pLCx 100, Lat OM 95, RCA stent ok, S-PDA 100, L-LAD ok, S-OM1 stent 80 ISR >> PCI: POBA to S-OM1; DES to native Lat OM1  .  CKD (chronic kidney disease), stage III ([place])   . Degeneration of lumbar or lumbosacral intervertebral disc   . GERD (gastroesophageal reflux disease)   . H/O hiatal hernia   . History of echocardiogram    Echo 3/18: Mild LVH, EF 60-65, Gr 2 DD  . History of nephrolithiasis   . Hyperlipidemia   . Hyperplastic colon polyp   . Hypertension   . Myocardial infarction (HCC)    hx of five   . PAD (peripheral artery disease) ([place])    LE Arterial US 1/22: R CIA > 50% prox and mid and < 50% distal; R ATA mid occlusion  . Stricture and stenosis of esophagus     Past Surgical History:  Procedure Laterality Date  . ABDOMINAL AORTOGRAM W/LOWER EXTREMITY N/A 02/17/2020   Procedure: ABDOMINAL AORTOGRAM W/LOWER EXTREMITY;  Surgeon: Wellington Hampshire, MD;  Location: [facility];  Service: Cardiovascular;  Laterality: N/A;  . ANGIOPLASTY    . BACK SURGERY    . CARDIAC CATHETERIZATION    . COLONOSCOPY    . CORONARY ARTERY BYPASS GRAFT  1993  . CORONARY STENT INTERVENTION N/A 03/19/2016   Procedure: Coronary Stent Intervention;  Surgeon: Sherren Mocha, MD;  Location: [facility];  Service: Cardiovascular;  Laterality: N/A;  . HOLMIUM LASER APPLICATION Right 1/94/1740   Procedure: HOLMIUM LASER APPLICATION;  Surgeon: Jorja Loa, MD;  Location: [facility];  Service: Urology;  Laterality: Right;  . LEFT HEART CATH AND CORS/GRAFTS ANGIOGRAPHY N/A 03/19/2016  Procedure: Left Heart Cath and Cors/Grafts Angiography;  Surgeon: Sherren Mocha, MD;  Location: [facility];  Service: Cardiovascular;  Laterality: N/A;  . LEFT HEART CATHETERIZATION WITH CORONARY/GRAFT ANGIOGRAM N/A 09/23/2012   Procedure: LEFT HEART CATHETERIZATION WITH Beatrix Fetters;  Surgeon: Peter M Martinique, MD;  Location: [facility];  Service: Cardiovascular;  Laterality: N/A;  . LITHOTRIPSY    . Lumbar back    . NEPHROLITHOTOMY Right 09/17/2013   Procedure: NEPHROLITHOTOMY PERCUTANEOUS;  Surgeon: Jorja Loa, MD;  Location: [facility];  Service: Urology;  Laterality: Right;  . PERCUTANEOUS CORONARY INTERVENTION-BALLOON ONLY  09/23/2012   Procedure: PERCUTANEOUS CORONARY INTERVENTION-BALLOON ONLY;  Surgeon: Peter M Martinique, MD;  Location: [facility];  Service: Cardiovascular;;  OM vein graft  . PERIPHERAL VASCULAR INTERVENTION Bilateral 02/17/2020   Procedure: PERIPHERAL VASCULAR INTERVENTION;  Surgeon: Wellington Hampshire, MD;  Location: [facility];  Service: Cardiovascular;  Laterality: Bilateral;  Iliac stents     Current Outpatient Medications  Medication Sig Dispense Refill  . acetaminophen (TYLENOL) 500 MG tablet Take 500 mg by mouth every 6 (six) hours as needed (pain.).    Marland Kitchen amLODipine (NORVASC) 5 MG tablet TAKE 1 TABLET BY MOUTH EVERY MORNING (Patient taking differently: Take 5 mg by mouth daily.) 90 tablet 3  . aspirin 81 MG chewable tablet Chew 1 tablet (81 mg total) by mouth daily. 1 tablet 0  . clopidogrel (PLAVIX) 75 MG tablet TAKE 1 TABLET BY MOUTH AT BEDTIME (Patient taking differently: Take 75 mg by mouth at bedtime.) 90 tablet 3  . furosemide (LASIX) 20 MG tablet Take 20 mg by mouth daily as needed for fluid.    Marland Kitchen glimepiride (AMARYL) 2 MG tablet Take 2 mg by mouth in the morning and at bedtime.    Marland Kitchen ibuprofen (ADVIL) 200 MG tablet Take 200 mg by mouth every 8 (eight) hours as needed (pain).    Marland Kitchen icosapent Ethyl (VASCEPA) 1 g capsule Take 2 capsules (2 g total) by mouth 2 (two) times daily. 120 capsule 6  . isosorbide mononitrate (IMDUR) 60 MG 24 hr tablet TAKE ONE AND ONE-HALF TABLETS DAILY (Patient taking differently: Take 90 mg by mouth daily.) 135 tablet 3  . metoprolol tartrate (LOPRESSOR) 50 MG tablet TAKE ONE TABLET TWICE DAILY (Patient taking differently: Take 50 mg by mouth 2 (two) times daily.) 180 tablet 3  . nitroGLYCERIN (NITROSTAT) 0.4 MG SL tablet Place 1 tablet (0.4 mg total) under the tongue every 5 (five) minutes as needed for chest pain. Up to 3 doses  (Patient taking differently: Place 0.4 mg under the tongue every 5 (five) minutes x 3 doses as needed for chest pain.) 25 tablet 1  . pantoprazole (PROTONIX) 40 MG tablet TAKE ONE (1) TABLET EACH DAY (Patient taking differently: Take 40 mg by mouth daily.) 90 tablet 3  . rosuvastatin (CRESTOR) 20 MG tablet TAKE 1 TABLET BY MOUTH EVERY DAY (Patient taking differently: Take 20 mg by mouth at bedtime.) 90 tablet 3   Current Facility-Administered Medications  Medication Dose Route Frequency Provider Last Rate Last Admin  . triamcinolone acetonide (KENALOG) 10 MG/ML injection 10 mg  10 mg Other Once Harriet Masson, DPM      . triamcinolone acetonide (KENALOG) 10 MG/ML injection 10 mg  10 mg Other Once Harriet Masson, DPM        Allergies:   Patient has no known allergies.    Social History:  The patient  reports that he quit smoking  about 33 years ago. He has quit using smokeless tobacco. He reports that he does not drink alcohol and does not use drugs.   Family History:  The patient's family history includes CAD (age of onset: 38) in some other family members; Coronary artery disease (age of onset: 66) in his mother; Kidney cancer in his mother; Lung cancer in his brother and sister.    ROS:  Please see the history of present illness.   Otherwise, review of systems are positive for none.   All other systems are reviewed and negative.    PHYSICAL EXAM: VS:  BP 140/70   Pulse (!) 58   Ht 5\' 6"  (1.676 m)   Wt 168 lb 3.2 oz (76.3 kg)   SpO2 98%   BMI 27.15 kg/m  , BMI Body mass index is 27.15 kg/m. GEN: Well nourished, well developed, in no acute distress  HEENT: normal  Neck: no JVD, or masses.  Right carotid bruit Cardiac: RRR; no murmurs, rubs, or gallops,no edema  Respiratory:  clear to auscultation bilaterally, normal work of breathing GI: soft, nontender, nondistended, + BS MS: no deformity or atrophy  Skin: warm and dry, no rash Neuro:  Strength and sensation are intact Psych:  euthymic mood, full affect Vascular: Femoral pulse: +2 bilaterally with no hematoma.  Distal pulses are palpable and normal.  EKG:  EKG is ordered today. EKG showed sinus bradycardia with no significant ST or T wave changes.   Recent Labs: 12/29/2019: ALT 11 02/02/2020: BUN 14; Creatinine, Ser 1.44; Hemoglobin 14.0; Platelets 188; Potassium 4.6; Sodium 137    Lipid Panel    Component Value Date/Time   CHOL 134 12/29/2019 0840   TRIG 363 (H) 12/29/2019 0840   HDL 24 (L) 12/29/2019 0840   CHOLHDL 5.6 (H) 12/29/2019 0840   CHOLHDL 4.4 01/11/2015 0820   VLDL 24 01/11/2015 0820   LDLCALC 54 12/29/2019 0840   LDLDIRECT 122.5 12/02/2013 0922      Wt Readings from Last 3 Encounters:  03/01/20 168 lb 3.2 oz (76.3 kg)  02/18/20 164 lb 6.4 oz (74.6 kg)  02/02/20 169 lb 9.6 oz (76.9 kg)        No flowsheet data found.    ASSESSMENT AND PLAN:  1.  Peripheral arterial disease: Status post recent bilateral common iliac artery stent placement extending into the distal aorta.  He is scheduled for an ABI and aortoiliac duplex in few days.  He reports complete resolution of claudication.  He is already on dual antiplatelet therapy.  2.  Coronary artery disease involving native coronary arteries.  Status post CABG and multiple PCI's.  No recurrent angina.  3.  Essential hypertension: Blood pressure is reasonably controlled on current medications.  4.  Hyperlipidemia: Currently on rosuvastatin and Vascepa.  Most recent lipid profile showed an LDL of 54.  He is due for lipid profile to recheck since he was started on the CPAP.  5.  Chronic kidney disease: Creatinine was stable after angiogram.  6.  Right carotid bruit: Recent carotid Doppler showed moderate 40 to 59% stenosis bilaterally with plans to repeat carotid Doppler in 1 year.    Disposition:   FU with me in 6 months  Signed,  [name], MD  03/01/2020 9:15 AM    [place]

## 2020-03-04 ENCOUNTER — Other Ambulatory Visit: Payer: Self-pay | Admitting: Cardiovascular Disease

## 2020-03-04 ENCOUNTER — Ambulatory Visit (HOSPITAL_BASED_OUTPATIENT_CLINIC_OR_DEPARTMENT_OTHER)
Admission: RE | Admit: 2020-03-04 | Discharge: 2020-03-04 | Disposition: A | Discharge status: Home / Self Care | Payer: Medicare Other | Source: Ambulatory Visit | Attending: Cardiovascular Disease | Admitting: Cardiovascular Disease

## 2020-03-04 ENCOUNTER — Ambulatory Visit (HOSPITAL_COMMUNITY)
Admission: RE | Admit: 2020-03-04 | Discharge: 2020-03-04 | Disposition: A | Discharge status: Home / Self Care | Payer: Medicare Other | Source: Ambulatory Visit | Attending: Cardiology | Admitting: Cardiology

## 2020-03-04 ENCOUNTER — Other Ambulatory Visit: Payer: Self-pay

## 2020-03-04 DIAGNOSIS — Z95828 Presence of other vascular implants and grafts: Secondary | ICD-10-CM

## 2020-03-04 DIAGNOSIS — I739 Peripheral vascular disease, unspecified: Secondary | ICD-10-CM

## 2020-03-11 ENCOUNTER — Ambulatory Visit: Payer: Medicare Other | Admitting: Cardiovascular Disease

## 2020-03-21 DIAGNOSIS — I129 Hypertensive chronic kidney disease with stage 1 through stage 4 chronic kidney disease, or unspecified chronic kidney disease: Secondary | ICD-10-CM | POA: Diagnosis not present

## 2020-03-21 DIAGNOSIS — Z7984 Long term (current) use of oral hypoglycemic drugs: Secondary | ICD-10-CM | POA: Diagnosis not present

## 2020-03-21 DIAGNOSIS — I2510 Atherosclerotic heart disease of native coronary artery without angina pectoris: Secondary | ICD-10-CM | POA: Diagnosis not present

## 2020-03-21 DIAGNOSIS — E1122 Type 2 diabetes mellitus with diabetic chronic kidney disease: Secondary | ICD-10-CM | POA: Diagnosis not present

## 2020-03-21 DIAGNOSIS — N183 Chronic kidney disease, stage 3 unspecified: Secondary | ICD-10-CM | POA: Diagnosis not present

## 2020-03-29 ENCOUNTER — Other Ambulatory Visit: Payer: Medicare Other

## 2020-03-29 ENCOUNTER — Other Ambulatory Visit: Payer: Self-pay

## 2020-03-29 DIAGNOSIS — I25119 Atherosclerotic heart disease of native coronary artery with unspecified angina pectoris: Secondary | ICD-10-CM | POA: Diagnosis not present

## 2020-03-29 DIAGNOSIS — E782 Mixed hyperlipidemia: Secondary | ICD-10-CM | POA: Diagnosis not present

## 2020-03-29 LAB — HEPATIC FUNCTION PANEL
ALT: 13 IU/L (ref 0–44)
AST: 15 IU/L (ref 0–40)
Albumin: 3.9 g/dL (ref 3.7–4.7)
Alkaline Phosphatase: 69 IU/L (ref 44–121)
Bilirubin Total: 0.3 mg/dL (ref 0.0–1.2)
Bilirubin, Direct: <0.1 mg/dL (ref 0.00–0.40)
Total Protein: 6.7 g/dL (ref 6.0–8.5)

## 2020-03-29 LAB — LIPID PANEL
Chol/HDL Ratio: 4.4 ratio (ref 0.0–5.0)
Cholesterol, Total: 123 mg/dL (ref 100–199)
HDL: 28 mg/dL — ABNORMAL LOW (ref >39)
LDL Chol Calc (NIH): 69 mg/dL (ref 0–99)
Triglycerides: 149 mg/dL (ref 0–149)
VLDL Cholesterol Cal: 26 mg/dL (ref 5–40)

## 2020-06-27 NOTE — Progress Notes (Addendum)
Cardiology Office Note:    Date:  06/28/2020   ID:  [name], DOB 22-Aug-1945, MRN [id number]  PCP:  Allean Found, MD   [facility] Providers Cardiologist:  Sherren Mocha, MD Cardiology APP:  [facility]     Referring MD: Allean Found, MD   Chief Complaint:  Follow-up (CAD) and Chest Pain    Patient Profile:    [name] is a 75 y.o. male with:  Coronary artery disease w/ angina S/p CABG in 1993 S/p PCI to S-OM in 2009 and again in 2012, 2014, 2018 S/p POBA to S-OM1 and DES to OM in 2018 Heart failure with preserved ejection fraction  Carotid artery disease  [place] 2/22: Bilat ICA 40-59  Peripheral arterial disease  S/p bilateral CIA stents ext into distal Aorta in 02/2020 Chronic kidney disease Hypertension Hyperlipidemia      Prior CV studies: PV Intervention 02/17/20 1.  Significant distal aortic ulcerated stenosis extending into the ostium of both common iliac arteries with severe stenosis in the distal left common iliac artery. 2.  Successful bilateral kissing common iliac artery stent placement extending into the distal aorta with an additional overlapped self-expanding stent on the left side to cover the whole lesion.  VAS US CAROTID DUPLEX BILATERAL 02/11/2020 Summary: Bilateral ICA 40-59% L subclavian flow disturbed   Echo 03/20/16 Mild LVH, EF 60-65, Gr 2 DD   LHC 03/19/16 LAD proximal 100 RI ostial 100 LCx proximal 100, lateral OM1 95 RCA stent patent with 30-40 ISR SVG-RPDA 100 LIMA-LAD patent SVG-OM1 proximal stent 80 ISR PCI: POBA to the SVG-OM1;Synergy Des 2.25x16 to the native lateral OM1   History of Present Illness: [name] was last seen in 12/21.  He had symptoms of claudication and I obtained vascular studies that were abnormal. He saw Dr. Fletcher Anon and underwent angiography with bilateral CIA stenting extending into the distal Aorta.  He was last seen by Dr. Fletcher Anon in 3/22.  He returns for f/u.  He is here alone.  He has had  more frequent chest discomfort over the past few weeks.  He did tell me that he had chest pain after his PV procedure in February.  I reviewed the discharge summary.  He was monitored overnight and did have mild elevation in his high-sensitivity troponins.  This was felt to be related to demand ischemia in the setting of uncontrolled blood pressure and no further work-up was pursued.  Over the past 2 weeks, he has had to take nitroglycerin twice which is unusual for him.  His chest discomfort comes and goes.  It is described as pressure or tightness.  He notes substernal symptoms with radiation up into his neck.  He sometimes has radiation into his arm.  His discomfort typically comes on at rest.  He does not really relate any exertional chest discomfort.  He is currently having chest pressure while at rest in the office.  This began sometime last night.  His current chest symptoms or not anything unusual for him.  He has not had syncope, orthopnea, significant leg edema.    Past Medical History:  Diagnosis Date   Anemia    Blood transfusion    CAD (coronary artery disease)    a. CABG 1993;  b. PCI of SVG to OM Jan 2012 (Drug-eluting stent x 2);  c. 09/2012 Cath/PCI: LM nl, LAD sev dzs, LCX 100, RCA patent stent, VG->AM 100, LIMA->LAD nl, VG->OM 95p ISR (PTCA only), patent distal stent. // d. LHC 3/18:  pLAD 100, oRI 100, pLCx 100, Lat OM 95, RCA stent ok, S-PDA 100, L-LAD ok, S-OM1 stent 80 ISR >> PCI: POBA to S-OM1; DES to native Lat OM1   CKD (chronic kidney disease), stage III (HCC)    Degeneration of lumbar or lumbosacral intervertebral disc    GERD (gastroesophageal reflux disease)    H/O hiatal hernia    History of echocardiogram    Echo 3/18: Mild LVH, EF 60-65, Gr 2 DD   History of nephrolithiasis    Hyperlipidemia    Hyperplastic colon polyp    Hypertension    Myocardial infarction (HCC)    hx of five    PAD (peripheral artery disease) ([place])    LE Arterial US 1/22: R CIA > 50% prox and  mid and < 50% distal; R ATA mid occlusion   Stricture and stenosis of esophagus     Current Medications: Current Meds  Medication Sig   acetaminophen (TYLENOL) 500 MG tablet Take 500 mg by mouth every 6 (six) hours as needed (pain.).   amLODipine (NORVASC) 5 MG tablet TAKE 1 TABLET BY MOUTH EVERY MORNING   aspirin 81 MG chewable tablet Chew 1 tablet (81 mg total) by mouth daily.   clopidogrel (PLAVIX) 75 MG tablet TAKE 1 TABLET BY MOUTH AT BEDTIME   furosemide (LASIX) 20 MG tablet Take 20 mg by mouth daily as needed for fluid.   glimepiride (AMARYL) 2 MG tablet Take 2 mg by mouth in the morning and at bedtime.   ibuprofen (ADVIL) 200 MG tablet Take 200 mg by mouth every 8 (eight) hours as needed (pain).   icosapent Ethyl (VASCEPA) 1 g capsule Take 2 capsules (2 g total) by mouth 2 (two) times daily.   isosorbide mononitrate (IMDUR) 120 MG 24 hr tablet Take 1 tablet (120 mg total) by mouth daily.   metFORMIN (GLUCOPHAGE-XR) 500 MG 24 hr tablet Take 1,000 mg by mouth 2 (two) times daily.   metoprolol tartrate (LOPRESSOR) 50 MG tablet TAKE ONE TABLET TWICE DAILY   nitroGLYCERIN (NITROSTAT) 0.4 MG SL tablet Place 1 tablet (0.4 mg total) under the tongue every 5 (five) minutes as needed for chest pain. Up to 3 doses   pantoprazole (PROTONIX) 40 MG tablet TAKE ONE (1) TABLET EACH DAY   rosuvastatin (CRESTOR) 20 MG tablet TAKE 1 TABLET BY MOUTH EVERY DAY   [DISCONTINUED] isosorbide mononitrate (IMDUR) 60 MG 24 hr tablet TAKE ONE AND ONE-HALF TABLETS DAILY   Current Facility-Administered Medications for the 06/28/20 encounter (Office Visit) with Richardson Dopp T, PA-C  Medication   triamcinolone acetonide (KENALOG) 10 MG/ML injection 10 mg   triamcinolone acetonide (KENALOG) 10 MG/ML injection 10 mg     Allergies:   Patient has no known allergies.   Social History   Tobacco Use   Smoking status: Former    Pack years: 0.00    Types: Cigarettes    Quit date: 01/06/1987    Years since  quitting: 33.4   Smokeless tobacco: Former  Scientific laboratory technician Use: Never used  Substance Use Topics   Alcohol use: No   Drug use: No     Family Hx: The patient's family history includes CAD (age of onset: 11) in some other family members; Coronary artery disease (age of onset: 48) in his mother; Kidney cancer in his mother; Lung cancer in his brother and sister. There is no history of Colon cancer.  Review of Systems  Constitutional: Negative for fever.  Cardiovascular:  Negative  for claudication.  Respiratory:  Negative for cough.   Gastrointestinal:  Negative for hematochezia and melena.  Genitourinary:  Negative for hematuria.  All other systems reviewed and are negative.   EKGs/Labs/Other Test Reviewed:    EKG:  EKG is   ordered today.  The ekg ordered today demonstrates sinus brady, HR 53, leftward axis, no acute ST-TW changes, QTc 407 ms  Recent Labs: 02/02/2020: BUN 14; Creatinine, Ser 1.44; Hemoglobin 14.0; Platelets 188; Potassium 4.6; Sodium 137 03/29/2020: ALT 13   Recent Lipid Panel Lab Results  Component Value Date/Time   CHOL 123 03/29/2020 07:31 AM   TRIG 149 03/29/2020 07:31 AM   HDL 28 (L) 03/29/2020 07:31 AM   LDLCALC 69 03/29/2020 07:31 AM   LDLDIRECT 122.5 12/02/2013 09:22 AM      Risk Assessment/Calculations:      Physical Exam:    VS:  BP (!) 142/70   Pulse (!) 54   Ht 5\' 6"  (1.676 m)   Wt 172 lb 6.4 oz (78.2 kg)   SpO2 97%   BMI 27.83 kg/m     Wt Readings from Last 3 Encounters:  06/28/20 172 lb 6.4 oz (78.2 kg)  03/01/20 168 lb 3.2 oz (76.3 kg)  02/18/20 164 lb 6.4 oz (74.6 kg)     Constitutional:      Appearance: Healthy appearance. Not in distress.  Neck:     Thyroid: No thyromegaly.     Vascular: JVD normal.     Lymphadenopathy: No cervical adenopathy.  Pulmonary:     Effort: Pulmonary effort is normal.     Breath sounds: No wheezing. No rales.  Cardiovascular:     Normal rate. Regular rhythm. Normal S1. Normal S2.       Murmurs: There is no murmur.  Edema:    Peripheral edema absent.  Abdominal:     Palpations: Abdomen is soft. There is no hepatomegaly.  Skin:    General: Skin is warm and dry.  Neurological:     General: No focal deficit present.     Mental Status: Alert and oriented to person, place and time.     Cranial Nerves: Cranial nerves are intact.  Psychiatric:        Mood and Affect: Affect normal.        ASSESSMENT & PLAN:    1. Coronary artery disease involving native coronary artery of native heart with angina pectoris ([place]) S/p CABG in 1993 and multiple PCI procedures since. Most recent PCI 3/18 with angioplasty to the vein graft to the first obtuse marginal and DES to the native lateral OM1.  He has had a change in his anginal pattern.  He has had to take nitroglycerin twice in the last 2 weeks.  He also had the demand ischemia event after his PV procedure.  He is currently having active chest tightness while in the office.  However, this is not unusual for him.  His current symptoms or not as bad as his symptoms in February or the symptoms for which he has taken nitroglycerin.  His electrocardiogram does not demonstrate any acute changes.  I reviewed his case today with Dr. Curt Bears (attending MD).  Overall, he seems to be stable without acute EKG changes.  We feel that he does not need admission to the hospital.  We do recommend proceeding with cardiac catheterization to further evaluate his symptoms.  This has been arranged for tomorrow.  The patient knows that he should go to the emergency room tonight if  he has any change in his symptoms.  I will increase his isosorbide to 120 mg daily.  Continue aspirin, clopidogrel, amlodipine, rosuvastatin, metoprolol tartrate.  He will hold his furosemide and metformin for his cardiac catheterization.  We will obtain stat labs today (BMET, CBC).  Arrange 2-week post cath follow-up.  2. PAD (peripheral artery disease) (HCC) Status post bilateral common  iliac artery stenting by Dr. Fletcher Anon in 2/22.  His claudication has resolved and he feels much better.  3. Bilateral carotid artery stenosis Carotid US in 2/22 with 40-59% stenosis bilaterally.  He will have follow-up Dopplers again in 2023.  4. Essential hypertension BP above target.  Adjust isosorbide as noted.  5. Pure hypercholesterolemia LDL in 3/22 was 69.  ALT was 13.  Continue current dose of rosuvastatin.  6. Type 2 diabetes mellitus with complication ([place]) Continue follow-up with primary care.  7. Stage 3b chronic kidney disease ([place]) Creatinine in 2/22 was 1.44.  Renal function overall stable.   Shared Decision Making/Informed Consent The risks [stroke (1 in 1000), death (1 in 1000), kidney failure [usually temporary] (1 in 500), bleeding (1 in 200), allergic reaction [possibly serious] (1 in 200)], benefits (diagnostic support and management of coronary artery disease) and alternatives of a cardiac catheterization were discussed in detail with Mr. Semel and he is willing to proceed.    Dispo:  Return in about 2 weeks (around 07/12/2020) for Post Procedure Follow Up, w/ Dr. Burt Knack, or Richardson Dopp, PA-C.   Medication Adjustments/Labs and Tests Ordered: Current medicines are reviewed at length with the patient today.  Concerns regarding medicines are outlined above.  Tests Ordered: Orders Placed This Encounter  Procedures   Basic metabolic panel   CBC   EKG 12-Lead   Medication Changes: Meds ordered this encounter  Medications   isosorbide mononitrate (IMDUR) 120 MG 24 hr tablet    Sig: Take 1 tablet (120 mg total) by mouth daily.    Dispense:  90 tablet    Refill:  1    Signed, Richardson Dopp, PA-C  06/28/2020 5:27 PM    [facility] [address], [place], [place]  [address] Phone: [phone]; Fax: [phone]

## 2020-06-28 ENCOUNTER — Ambulatory Visit (INDEPENDENT_AMBULATORY_CARE_PROVIDER_SITE_OTHER): Payer: Medicare Other | Admitting: Physician Assistant

## 2020-06-28 ENCOUNTER — Encounter: Payer: Self-pay | Admitting: Physician Assistant

## 2020-06-28 ENCOUNTER — Other Ambulatory Visit: Payer: Self-pay

## 2020-06-28 VITALS — BP 142/70 | HR 54 | Ht 66.0 in | Wt 172.4 lb

## 2020-06-28 DIAGNOSIS — E7800 Pure hypercholesterolemia, unspecified: Secondary | ICD-10-CM

## 2020-06-28 DIAGNOSIS — I10 Essential (primary) hypertension: Secondary | ICD-10-CM | POA: Diagnosis not present

## 2020-06-28 DIAGNOSIS — I25110 Atherosclerotic heart disease of native coronary artery with unstable angina pectoris: Secondary | ICD-10-CM

## 2020-06-28 DIAGNOSIS — I739 Peripheral vascular disease, unspecified: Secondary | ICD-10-CM | POA: Diagnosis not present

## 2020-06-28 DIAGNOSIS — N1832 Chronic kidney disease, stage 3b: Secondary | ICD-10-CM | POA: Diagnosis not present

## 2020-06-28 DIAGNOSIS — E118 Type 2 diabetes mellitus with unspecified complications: Secondary | ICD-10-CM | POA: Diagnosis not present

## 2020-06-28 DIAGNOSIS — I6523 Occlusion and stenosis of bilateral carotid arteries: Secondary | ICD-10-CM

## 2020-06-28 DIAGNOSIS — I25119 Atherosclerotic heart disease of native coronary artery with unspecified angina pectoris: Secondary | ICD-10-CM | POA: Diagnosis not present

## 2020-06-28 LAB — BASIC METABOLIC PANEL
BUN/Creatinine Ratio: 13 (ref 10–24)
BUN: 20 mg/dL (ref 8–27)
CO2: 27 mmol/L (ref 20–29)
Calcium: 8.6 mg/dL (ref 8.6–10.2)
Chloride: 105 mmol/L (ref 96–106)
Creatinine, Ser: 1.56 mg/dL — ABNORMAL HIGH (ref 0.76–1.27)
Glucose: 109 mg/dL — ABNORMAL HIGH (ref 65–99)
Potassium: 4.3 mmol/L (ref 3.5–5.2)
Sodium: 140 mmol/L (ref 134–144)
eGFR: 47 mL/min/{1.73_m2} — ABNORMAL LOW (ref >59)

## 2020-06-28 LAB — CBC
Hematocrit: 39.3 % (ref 37.5–51.0)
Hemoglobin: 13.4 g/dL (ref 13.0–17.7)
MCH: 30.4 pg (ref 26.6–33.0)
MCHC: 34.1 g/dL (ref 31.5–35.7)
MCV: 89 fL (ref 79–97)
Platelets: 163 10*3/uL (ref 150–450)
RBC: 4.41 x10E6/uL (ref 4.14–5.80)
RDW: 14.1 % (ref 11.6–15.4)
WBC: 5.9 10*3/uL (ref 3.4–10.8)

## 2020-06-28 MED ORDER — ISOSORBIDE MONONITRATE ER 120 MG PO TB24: 120.0000 mg | ORAL_TABLET | Freq: Every day | ORAL | 1 refills | Status: DC

## 2020-06-28 NOTE — Patient Instructions (Addendum)
Medication Instructions:  Your physician has recommended you make the following change in your medication:   INCREASE the Imdur (Isosorbide) to 120 mg taking 1 tablet daily   .  *If you need a refill on your cardiac medications before your next appointment, please call your pharmacy*   Lab Work: TODAY:  STAT BMET & CBC  If you have labs (blood work) drawn today and your tests are completely normal, you will receive your results only by: [place] (if you have MyChart) OR A paper copy in the mail If you have any lab test that is abnormal or we need to change your treatment, we will call you to review the results.   Testing/Procedures: Your physician has requested that you have a cardiac catheterization. Cardiac catheterization is used to diagnose and/or treat various heart conditions. Doctors may recommend this procedure for a number of different reasons. The most common reason is to evaluate chest pain. Chest pain can be a symptom of coronary artery disease (CAD), and cardiac catheterization can show whether plaque is narrowing or blocking your heart's arteries. This procedure is also used to evaluate the valves, as well as measure the blood flow and oxygen levels in different parts of your heart. For further information please visit [email or web address]. Please follow instruction sheet, as given.       [facility] OFFICE [place], [place] [address] Dept: [phone] Loc: [phone]  [name]  06/28/2020  You are scheduled for a Cardiac Catheterization on Wednesday, June 29 with Dr. Glenetta Hew.  1. Please arrive at the [facility] (Main Entrance A) at [facility]: [address] [place], [place] [address] at 10:30 AM (This time is two hours before your procedure to ensure your preparation). Free valet parking service is available.   Special note: Every effort is  made to have your procedure done on time. Please understand that emergencies sometimes delay scheduled procedures.  2. Diet: Do not eat solid foods after midnight.  The patient may have clear liquids until 5am upon the day of the procedure.  3. Labs: You will need to have blood drawn on  TODAY:  STAT BMET & CBC    4. Medication instructions in preparation for your procedure:  Stop taking, Lasix (Furosemide)  Wednesday, June 29,    Do not take Diabetes Med Glucophage (Metformin) on the day of the procedure and HOLD 48 HOURS AFTER THE PROCEDURE.    Contrast Allergy: No  On the morning of your procedure, take your Aspirin and any morning medicines NOT listed above.  You may use sips of water.  5. Plan for one night stay--bring personal belongings. 6. Bring a current list of your medications and current insurance cards. 7. You MUST have a responsible person to drive you home. 8. Someone MUST be with you the first 24 hours after you arrive home or your discharge will be delayed. 9. Please wear clothes that are easy to get on and off and wear slip-on shoes.  Thank you for allowing [place] to care for you!   -- [facility]   Follow-Up: At [facility], you and your health needs are our priority.  As part of our continuing mission to provide you with exceptional heart care, we have created designated Provider Care Teams.  These Care Teams include your primary Cardiologist (physician) and Advanced Practice Providers (APPs -  Physician Assistants and Nurse  Practitioners) who all work together to provide you with the care you need, when you need it.  We recommend signing up for the patient portal called "MyChart".  Sign up information is provided on this After Visit Summary.  MyChart is used to connect with patients for Virtual Visits (Telemedicine).  Patients are able to view lab/test results, encounter notes, upcoming appointments, etc.  Non-urgent messages can be  sent to your provider as well.   To learn more about what you can do with MyChart, go to [email or web address].    Your next appointment:   2 week(s)   07/06/2020 ARRIVE AT 7:30 A.M.  The format for your next appointment:   In Person  Provider:   You may see Sherren Mocha, MD or one of the following Advanced Practice Providers on your designated Care Team:   Richardson Dopp, PA-C Vin Bhagat, [place]   Other Instructions PLEASE PRESENT TO THE EMERGENCY ROOM TONIGHT IF [place].

## 2020-06-29 ENCOUNTER — Ambulatory Visit (HOSPITAL_COMMUNITY)
Admission: RE | Admit: 2020-06-29 | Discharge: 2020-06-30 | Disposition: A | Discharge status: Home / Self Care | Payer: Medicare Other | Attending: Cardiology | Admitting: Cardiology

## 2020-06-29 ENCOUNTER — Encounter (HOSPITAL_COMMUNITY)
Admission: RE | Disposition: A | Discharge status: Home / Self Care | Payer: Self-pay | Source: Home / Self Care | Attending: Cardiology

## 2020-06-29 DIAGNOSIS — T82855A Stenosis of coronary artery stent, initial encounter: Secondary | ICD-10-CM | POA: Insufficient documentation

## 2020-06-29 DIAGNOSIS — E785 Hyperlipidemia, unspecified: Secondary | ICD-10-CM | POA: Diagnosis not present

## 2020-06-29 DIAGNOSIS — I129 Hypertensive chronic kidney disease with stage 1 through stage 4 chronic kidney disease, or unspecified chronic kidney disease: Secondary | ICD-10-CM | POA: Insufficient documentation

## 2020-06-29 DIAGNOSIS — Z9861 Coronary angioplasty status: Secondary | ICD-10-CM

## 2020-06-29 DIAGNOSIS — I2582 Chronic total occlusion of coronary artery: Secondary | ICD-10-CM | POA: Diagnosis not present

## 2020-06-29 DIAGNOSIS — I25110 Atherosclerotic heart disease of native coronary artery with unstable angina pectoris: Secondary | ICD-10-CM | POA: Diagnosis not present

## 2020-06-29 DIAGNOSIS — N1832 Chronic kidney disease, stage 3b: Secondary | ICD-10-CM | POA: Diagnosis not present

## 2020-06-29 DIAGNOSIS — E1122 Type 2 diabetes mellitus with diabetic chronic kidney disease: Secondary | ICD-10-CM | POA: Insufficient documentation

## 2020-06-29 DIAGNOSIS — I10 Essential (primary) hypertension: Secondary | ICD-10-CM | POA: Diagnosis present

## 2020-06-29 DIAGNOSIS — Z951 Presence of aortocoronary bypass graft: Secondary | ICD-10-CM

## 2020-06-29 DIAGNOSIS — Y848 Other medical procedures as the cause of abnormal reaction of the patient, or of later complication, without mention of misadventure at the time of the procedure: Secondary | ICD-10-CM | POA: Diagnosis not present

## 2020-06-29 DIAGNOSIS — I200 Unstable angina: Secondary | ICD-10-CM | POA: Diagnosis present

## 2020-06-29 DIAGNOSIS — M7730 Calcaneal spur, unspecified foot: Secondary | ICD-10-CM

## 2020-06-29 DIAGNOSIS — I2510 Atherosclerotic heart disease of native coronary artery without angina pectoris: Secondary | ICD-10-CM

## 2020-06-29 DIAGNOSIS — Z8249 Family history of ischemic heart disease and other diseases of the circulatory system: Secondary | ICD-10-CM | POA: Insufficient documentation

## 2020-06-29 DIAGNOSIS — Z7902 Long term (current) use of antithrombotics/antiplatelets: Secondary | ICD-10-CM | POA: Insufficient documentation

## 2020-06-29 DIAGNOSIS — Z7984 Long term (current) use of oral hypoglycemic drugs: Secondary | ICD-10-CM | POA: Insufficient documentation

## 2020-06-29 DIAGNOSIS — I209 Angina pectoris, unspecified: Secondary | ICD-10-CM

## 2020-06-29 DIAGNOSIS — Z79899 Other long term (current) drug therapy: Secondary | ICD-10-CM | POA: Insufficient documentation

## 2020-06-29 DIAGNOSIS — E7800 Pure hypercholesterolemia, unspecified: Secondary | ICD-10-CM | POA: Insufficient documentation

## 2020-06-29 DIAGNOSIS — Z955 Presence of coronary angioplasty implant and graft: Secondary | ICD-10-CM | POA: Diagnosis not present

## 2020-06-29 DIAGNOSIS — I6523 Occlusion and stenosis of bilateral carotid arteries: Secondary | ICD-10-CM | POA: Insufficient documentation

## 2020-06-29 DIAGNOSIS — Z87891 Personal history of nicotine dependence: Secondary | ICD-10-CM | POA: Diagnosis not present

## 2020-06-29 DIAGNOSIS — Z7982 Long term (current) use of aspirin: Secondary | ICD-10-CM | POA: Diagnosis not present

## 2020-06-29 DIAGNOSIS — M79609 Pain in unspecified limb: Secondary | ICD-10-CM

## 2020-06-29 DIAGNOSIS — M7732 Calcaneal spur, left foot: Secondary | ICD-10-CM

## 2020-06-29 DIAGNOSIS — I739 Peripheral vascular disease, unspecified: Secondary | ICD-10-CM | POA: Insufficient documentation

## 2020-06-29 DIAGNOSIS — M722 Plantar fascial fibromatosis: Secondary | ICD-10-CM

## 2020-06-29 HISTORY — PX: LEFT HEART CATH AND CORS/GRAFTS ANGIOGRAPHY: CATH118250

## 2020-06-29 HISTORY — PX: CORONARY BALLOON ANGIOPLASTY: CATH118233

## 2020-06-29 LAB — GLUCOSE, CAPILLARY
Glucose-Capillary: 137 mg/dL — ABNORMAL HIGH (ref 70–99)
Glucose-Capillary: 58 mg/dL — ABNORMAL LOW (ref 70–99)
Glucose-Capillary: 171 mg/dL — ABNORMAL HIGH (ref 70–99)

## 2020-06-29 LAB — POCT ACTIVATED CLOTTING TIME
Activated Clotting Time: 254 seconds
Activated Clotting Time: 312 seconds

## 2020-06-29 SURGERY — LEFT HEART CATH AND CORS/GRAFTS ANGIOGRAPHY
Anesthesia: LOCAL

## 2020-06-29 MED ORDER — LIDOCAINE HCL (PF) 1 % IJ SOLN
INTRAMUSCULAR | Status: AC
  Filled 2020-06-29: qty 30

## 2020-06-29 MED ORDER — SODIUM CHLORIDE 0.9 % WEIGHT BASED INFUSION
1.0000 mL/kg/h | INTRAVENOUS | Status: AC
  Administered 2020-06-29: 1 mL/kg/h via INTRAVENOUS

## 2020-06-29 MED ORDER — ASPIRIN 81 MG PO CHEW
81.0000 mg | CHEWABLE_TABLET | ORAL | Status: DC
Start: 2020-06-30 — End: 2020-06-29

## 2020-06-29 MED ORDER — VERAPAMIL HCL 2.5 MG/ML IV SOLN
INTRAVENOUS | Status: AC
  Filled 2020-06-29: qty 2

## 2020-06-29 MED ORDER — NITROGLYCERIN 0.4 MG SL SUBL: 0.4000 mg | SUBLINGUAL_TABLET | SUBLINGUAL | Status: DC | PRN

## 2020-06-29 MED ORDER — SODIUM CHLORIDE 0.9 % WEIGHT BASED INFUSION
1.0000 mL/kg/h | INTRAVENOUS | Status: DC
  Administered 2020-06-29: 1 mL/kg/h via INTRAVENOUS

## 2020-06-29 MED ORDER — HEPARIN (PORCINE) IN NACL 1000-0.9 UT/500ML-% IV SOLN
INTRAVENOUS | Status: AC
  Filled 2020-06-29: qty 1000

## 2020-06-29 MED ORDER — HEPARIN SODIUM (PORCINE) 1000 UNIT/ML IJ SOLN
INTRAMUSCULAR | Status: AC
  Filled 2020-06-29: qty 1

## 2020-06-29 MED ORDER — PANTOPRAZOLE SODIUM 40 MG PO TBEC
40.0000 mg | DELAYED_RELEASE_TABLET | Freq: Every day | ORAL | Status: DC
  Administered 2020-06-29: 40 mg via ORAL
  Filled 2020-06-29: qty 1

## 2020-06-29 MED ORDER — INSULIN ASPART 100 UNIT/ML IJ SOLN
0.0000 [IU] | Freq: Three times a day (TID) | INTRAMUSCULAR | Status: DC
  Administered 2020-06-30: 5 [IU] via SUBCUTANEOUS

## 2020-06-29 MED ORDER — HYDRALAZINE HCL 20 MG/ML IJ SOLN
INTRAMUSCULAR | Status: AC
  Filled 2020-06-29: qty 1

## 2020-06-29 MED ORDER — FENTANYL CITRATE (PF) 100 MCG/2ML IJ SOLN
INTRAMUSCULAR | Status: DC | PRN
  Administered 2020-06-29: 25 ug via INTRAVENOUS

## 2020-06-29 MED ORDER — HEPARIN (PORCINE) IN NACL 1000-0.9 UT/500ML-% IV SOLN
INTRAVENOUS | Status: DC | PRN
  Administered 2020-06-29 (×2): 500 mL

## 2020-06-29 MED ORDER — HEPARIN SODIUM (PORCINE) 1000 UNIT/ML IJ SOLN
INTRAMUSCULAR | Status: DC | PRN
  Administered 2020-06-29: 2000 [IU] via INTRAVENOUS
  Administered 2020-06-29 (×2): 4000 [IU] via INTRAVENOUS

## 2020-06-29 MED ORDER — METOPROLOL TARTRATE 50 MG PO TABS
50.0000 mg | ORAL_TABLET | Freq: Two times a day (BID) | ORAL | Status: DC
  Administered 2020-06-29 – 2020-06-30 (×2): 50 mg via ORAL
  Filled 2020-06-29 (×2): qty 1

## 2020-06-29 MED ORDER — AMLODIPINE BESYLATE 5 MG PO TABS
5.0000 mg | ORAL_TABLET | Freq: Every morning | ORAL | Status: DC
  Administered 2020-06-30: 5 mg via ORAL
  Filled 2020-06-29: qty 1

## 2020-06-29 MED ORDER — HYDRALAZINE HCL 20 MG/ML IJ SOLN
10.0000 mg | INTRAMUSCULAR | Status: AC | PRN
  Administered 2020-06-29: 10 mg via INTRAVENOUS

## 2020-06-29 MED ORDER — ONDANSETRON HCL 4 MG/2ML IJ SOLN: 4.0000 mg | Freq: Four times a day (QID) | INTRAMUSCULAR | Status: DC | PRN

## 2020-06-29 MED ORDER — LABETALOL HCL 5 MG/ML IV SOLN: 10.0000 mg | INTRAVENOUS | Status: AC | PRN

## 2020-06-29 MED ORDER — ICOSAPENT ETHYL 1 G PO CAPS
2.0000 g | ORAL_CAPSULE | Freq: Two times a day (BID) | ORAL | Status: DC
  Administered 2020-06-29 – 2020-06-30 (×2): 2 g via ORAL
  Filled 2020-06-29 (×3): qty 2

## 2020-06-29 MED ORDER — FUROSEMIDE 20 MG PO TABS: 20.0000 mg | ORAL_TABLET | Freq: Every day | ORAL | Status: DC | PRN

## 2020-06-29 MED ORDER — ROSUVASTATIN CALCIUM 20 MG PO TABS
20.0000 mg | ORAL_TABLET | Freq: Every day | ORAL | Status: DC
  Administered 2020-06-29 – 2020-06-30 (×2): 20 mg via ORAL
  Filled 2020-06-29 (×2): qty 1

## 2020-06-29 MED ORDER — FENTANYL CITRATE (PF) 100 MCG/2ML IJ SOLN
INTRAMUSCULAR | Status: AC
  Filled 2020-06-29: qty 2

## 2020-06-29 MED ORDER — CLOPIDOGREL BISULFATE 75 MG PO TABS: 75.0000 mg | ORAL_TABLET | Freq: Every day | ORAL | Status: DC

## 2020-06-29 MED ORDER — MIDAZOLAM HCL 2 MG/2ML IJ SOLN
INTRAMUSCULAR | Status: AC
  Filled 2020-06-29: qty 2

## 2020-06-29 MED ORDER — ACETAMINOPHEN 325 MG PO TABS: 650.0000 mg | ORAL_TABLET | ORAL | Status: DC | PRN

## 2020-06-29 MED ORDER — SODIUM CHLORIDE 0.9% FLUSH
3.0000 mL | Freq: Two times a day (BID) | INTRAVENOUS | Status: DC
  Administered 2020-06-30 (×2): 3 mL via INTRAVENOUS

## 2020-06-29 MED ORDER — ASPIRIN 81 MG PO CHEW
81.0000 mg | CHEWABLE_TABLET | Freq: Every day | ORAL | Status: DC
  Administered 2020-06-30: 81 mg via ORAL
  Filled 2020-06-29: qty 1

## 2020-06-29 MED ORDER — SODIUM CHLORIDE 0.9% FLUSH: 3.0000 mL | INTRAVENOUS | Status: DC | PRN

## 2020-06-29 MED ORDER — SODIUM CHLORIDE 0.9 % IV SOLN: 250.0000 mL | INTRAVENOUS | Status: DC | PRN

## 2020-06-29 MED ORDER — IOHEXOL 350 MG/ML SOLN
INTRAVENOUS | Status: DC | PRN
  Administered 2020-06-29: 125 mL

## 2020-06-29 MED ORDER — VERAPAMIL HCL 2.5 MG/ML IV SOLN
INTRAVENOUS | Status: DC | PRN
  Administered 2020-06-29: 10 mL via INTRA_ARTERIAL

## 2020-06-29 MED ORDER — MIDAZOLAM HCL 2 MG/2ML IJ SOLN
INTRAMUSCULAR | Status: DC | PRN
  Administered 2020-06-29: 1 mg via INTRAVENOUS

## 2020-06-29 MED ORDER — GLIMEPIRIDE 2 MG PO TABS
2.0000 mg | ORAL_TABLET | Freq: Every day | ORAL | Status: DC
  Administered 2020-06-30: 2 mg via ORAL
  Filled 2020-06-29: qty 1

## 2020-06-29 MED ORDER — SODIUM CHLORIDE 0.9 % WEIGHT BASED INFUSION
3.0000 mL/kg/h | INTRAVENOUS | Status: DC
  Administered 2020-06-29: 3 mL/kg/h via INTRAVENOUS

## 2020-06-29 MED ORDER — LIDOCAINE HCL (PF) 1 % IJ SOLN
INTRAMUSCULAR | Status: DC | PRN
  Administered 2020-06-29: 2 mL

## 2020-06-29 SURGICAL SUPPLY — 18 items
BALLN SAPPHIRE 2.0X12 (BALLOONS) ×2
BALLN SAPPHIRE ~~LOC~~ 2.5X15 (BALLOONS) ×1 IMPLANT
BALLN SCOREFLEX 2.50X10 (BALLOONS) ×2
BALLN ~~LOC~~ EUPHORA RX 2.75X8 (BALLOONS) ×2
BALLOON SAPPHIRE 2.0X12 (BALLOONS) IMPLANT
BALLOON SCOREFLEX 2.50X10 (BALLOONS) IMPLANT
BALLOON ~~LOC~~ EUPHORA RX 2.75X8 (BALLOONS) IMPLANT
CATH 5FR JL3.5 JR4 ANG PIG MP (CATHETERS) ×1 IMPLANT
CATH LAUNCHER 6FR JR4 (CATHETERS) ×1 IMPLANT
DEVICE RAD COMP TR BAND LRG (VASCULAR PRODUCTS) ×1 IMPLANT
GLIDESHEATH SLEND SS 6F .021 (SHEATH) ×1 IMPLANT
KIT HEART LEFT (KITS) ×2 IMPLANT
PACK CARDIAC CATHETERIZATION (CUSTOM PROCEDURE TRAY) ×2 IMPLANT
TRANSDUCER W/STOPCOCK (MISCELLANEOUS) ×2 IMPLANT
TUBING CIL FLEX 10 FLL-RA (TUBING) ×2 IMPLANT
WIRE ASAHI PROWATER 180CM (WIRE) ×1 IMPLANT
WIRE EMERALD 3MM-J .035X260CM (WIRE) ×1 IMPLANT
WIRE HI TORQ VERSACORE-J 145CM (WIRE) ×1 IMPLANT

## 2020-06-29 NOTE — Progress Notes (Signed)
TR BAND REMOVAL  LOCATION:    Left radial  DEFLATED PER PROTOCOL:    Yes.    TIME BAND OFF / DRESSING APPLIED:    2100PM a clean dry dressing applied with gauze and tegaderm and wrapped with coban   SITE UPON ARRIVAL:    Level 0  SITE AFTER BAND REMOVAL:    Level 0  CIRCULATION SENSATION AND MOVEMENT:    Within Normal Limits   Yes.    COMMENTS:   Care instruction given to  patient.

## 2020-06-29 NOTE — Progress Notes (Signed)
Patient c/o pain in back of head, pressure like, he states this happens at home frequently.  Wife just thought she would mention it here while the patient is in the hospital.  States he doesn't take anything for it and it just goes away.  Will let Dr Ellyn Hack know.

## 2020-06-30 ENCOUNTER — Encounter (HOSPITAL_COMMUNITY): Payer: Self-pay | Admitting: Cardiology

## 2020-06-30 DIAGNOSIS — I2582 Chronic total occlusion of coronary artery: Secondary | ICD-10-CM | POA: Diagnosis not present

## 2020-06-30 DIAGNOSIS — T82855A Stenosis of coronary artery stent, initial encounter: Secondary | ICD-10-CM | POA: Diagnosis not present

## 2020-06-30 DIAGNOSIS — I25110 Atherosclerotic heart disease of native coronary artery with unstable angina pectoris: Secondary | ICD-10-CM | POA: Diagnosis not present

## 2020-06-30 DIAGNOSIS — E785 Hyperlipidemia, unspecified: Secondary | ICD-10-CM | POA: Diagnosis not present

## 2020-06-30 DIAGNOSIS — E1122 Type 2 diabetes mellitus with diabetic chronic kidney disease: Secondary | ICD-10-CM | POA: Diagnosis not present

## 2020-06-30 DIAGNOSIS — I200 Unstable angina: Secondary | ICD-10-CM | POA: Diagnosis not present

## 2020-06-30 DIAGNOSIS — N1832 Chronic kidney disease, stage 3b: Secondary | ICD-10-CM | POA: Diagnosis not present

## 2020-06-30 DIAGNOSIS — I6523 Occlusion and stenosis of bilateral carotid arteries: Secondary | ICD-10-CM | POA: Diagnosis not present

## 2020-06-30 DIAGNOSIS — I129 Hypertensive chronic kidney disease with stage 1 through stage 4 chronic kidney disease, or unspecified chronic kidney disease: Secondary | ICD-10-CM | POA: Diagnosis not present

## 2020-06-30 DIAGNOSIS — E7800 Pure hypercholesterolemia, unspecified: Secondary | ICD-10-CM | POA: Diagnosis not present

## 2020-06-30 DIAGNOSIS — Z955 Presence of coronary angioplasty implant and graft: Secondary | ICD-10-CM | POA: Diagnosis not present

## 2020-06-30 DIAGNOSIS — Z951 Presence of aortocoronary bypass graft: Secondary | ICD-10-CM | POA: Diagnosis not present

## 2020-06-30 DIAGNOSIS — I739 Peripheral vascular disease, unspecified: Secondary | ICD-10-CM | POA: Diagnosis not present

## 2020-06-30 LAB — BASIC METABOLIC PANEL
Anion gap: 4 — ABNORMAL LOW (ref 5–15)
BUN: 13 mg/dL (ref 8–23)
CO2: 25 mmol/L (ref 22–32)
Calcium: 8.7 mg/dL — ABNORMAL LOW (ref 8.9–10.3)
Chloride: 109 mmol/L (ref 98–111)
Creatinine, Ser: 1.52 mg/dL — ABNORMAL HIGH (ref 0.61–1.24)
GFR, Estimated: 48 mL/min — ABNORMAL LOW (ref >60)
Glucose, Bld: 178 mg/dL — ABNORMAL HIGH (ref 70–99)
Potassium: 3.5 mmol/L (ref 3.5–5.1)
Sodium: 138 mmol/L (ref 135–145)

## 2020-06-30 LAB — GLUCOSE, CAPILLARY
Glucose-Capillary: 210 mg/dL — ABNORMAL HIGH (ref 70–99)
Glucose-Capillary: 80 mg/dL (ref 70–99)

## 2020-06-30 LAB — CBC
HCT: 36.2 % — ABNORMAL LOW (ref 39.0–52.0)
Hemoglobin: 12.9 g/dL — ABNORMAL LOW (ref 13.0–17.0)
MCH: 31.1 pg (ref 26.0–34.0)
MCHC: 35.6 g/dL (ref 30.0–36.0)
MCV: 87.2 fL (ref 80.0–100.0)
Platelets: 150 10*3/uL (ref 150–400)
RBC: 4.15 MIL/uL — ABNORMAL LOW (ref 4.22–5.81)
RDW: 12.5 % (ref 11.5–15.5)
WBC: 5.3 10*3/uL (ref 4.0–10.5)
nRBC: 0 % (ref 0.0–0.2)

## 2020-06-30 LAB — HEMOGLOBIN A1C
Hgb A1c MFr Bld: 8.1 % — ABNORMAL HIGH (ref 4.8–5.6)
Mean Plasma Glucose: 186 mg/dL

## 2020-06-30 MED ORDER — NITROGLYCERIN 0.4 MG SL SUBL: 0.4000 mg | SUBLINGUAL_TABLET | SUBLINGUAL | 1 refills | Status: DC | PRN

## 2020-06-30 MED ORDER — AMLODIPINE BESYLATE 10 MG PO TABS: 10.0000 mg | ORAL_TABLET | Freq: Every morning | ORAL | Status: DC

## 2020-06-30 MED ORDER — AMLODIPINE BESYLATE 5 MG PO TABS
5.0000 mg | ORAL_TABLET | Freq: Once | ORAL | Status: AC
  Administered 2020-06-30: 5 mg via ORAL
  Filled 2020-06-30: qty 1

## 2020-06-30 MED ORDER — AMLODIPINE BESYLATE 10 MG PO TABS: 10.0000 mg | ORAL_TABLET | Freq: Every morning | ORAL | 1 refills | Status: DC

## 2020-06-30 MED ORDER — ASPIRIN 81 MG PO CHEW: 81.0000 mg | CHEWABLE_TABLET | Freq: Every day | ORAL | 1 refills | Status: DC

## 2020-06-30 NOTE — Discharge Summary (Addendum)
Discharge Summary    Patient ID: [name] MRN: [id number]; DOB: 06-12-1945  Admit date: 06/29/2020 Discharge date: 06/30/2020  PCP:  Allean Found, MD   [facility] Providers Cardiologist:  Sherren Mocha, MD  Cardiology APP:  Liliane Shi, PA-C  {   Discharge Diagnoses    Principal Problem:   Progressive angina [facility]) Active Problems:   Hyperlipidemia with target LDL less than 70   Essential hypertension   Coronary artery disease involving native coronary artery of native heart with unstable angina pectoris ([place])   CAD S/P PCI - multiple stents in native RCA, Stent from SVG-OM into AVG Cx   Hx of CABG    Diagnostic Studies/Procedures    Cath: 06/29/20  The left ventricular systolic function is normal. LV end diastolic pressure is normal. There is no aortic valve stenosis. Mid RCA to Dist RCA lesion is 90% stenosed. Prox RCA-1 lesion is 15% stenosed. Prox RCA-2 lesion is 70% stenosed. Prox RCA to Mid RCA lesion is 15% stenosed. Post intervention, there is a 45% residual stenosis. Scoring balloon angioplasty was performed using a BALLOON SCOREFLEX 2.50X10. Post intervention, there is a 10% residual stenosis. Balloon angioplasty was performed using a BALLOON [place] EUPHORA RX 2.75X8. Mid LM to Prox LAD lesion is 100% stenosed with 100% stenosed side branch in Ost Cx to Mid Cx. LIMA. Mid Cx to Dist Cx lesion is 100% stenosed. SVG graft was visualized by angiography and is large. Origin to Prox Graft lesion is 5% stenosed. Mid Graft to Insertion lesion is 20% stenosed. SVG graft was visualized by angiography. Origin to Prox Graft lesion is 100% stenosed.   SUMMARY Severe native CAD with essentially occluded Left Coronary System, extensively diseased RCA with 2 focal segments of 70% and then 90% in-stent stenosis. Moderately successful scoring balloon and high-pressure  balloon angioplasty of the stented segment of the RCA reducing the 70% stenosis of 20% in the  90% to 45%. Widely patent LIMA-LAD with collateral flow to the RCA and LCx Patent SVG-OM with patent ostial stent, mild in-stent restenosis in the body of the graft with occluded downstream stent in the inferior branch of the LCx with patent OM Preserved LVEF with no obvious wall motion abnormality; normal LVEDP     RECOMMENDATION He will remain in-house overnight as outpatient with extended recovery with plan to discharge tomorrow Continue aggressive risk factor modification   Glenetta Hew, MD   Diagnostic Dominance: Right    Intervention     _____________   History of Present Illness     [name] is a 75 y.o. male with PMH of CAD s/p CABG with multiple PCIs, HFpEF, Carotid artery disease, PAD, CKD, HTN, and HLD. [name] was seen back 12/21 with Richardson Dopp.  He had symptoms of claudication and vascular studies were obtained that were abnormal. He saw Dr. Fletcher Anon and underwent angiography with bilateral CIA stenting extending into the distal Aorta.  He was last seen by Dr. Fletcher Anon in 3/22.  He returns to the office on 06/28/20 for f/u. He reported having had more frequent chest discomfort over the past few weeks prior to this visit.  He also had chest pain after his PV procedure in February. He was monitored overnight and did have mild elevation in his high-sensitivity troponins.  This was felt to be related to demand ischemia in the setting of uncontrolled blood pressure and no further work-up was pursued.  Over the past 2 weeks prior to visit, he had to take  nitroglycerin twice which was unusual for him.  His chest discomfort comes and goes.  It was described as pressure or tightness.  He noted substernal symptoms with radiation up into his neck.  He sometimes has radiation into his arm.  His discomfort typically comes on at rest.  He does not really relate any exertional chest discomfort.  He was currently having chest pressure while at rest in the office.  His current chest symptoms  are not anything unusual for him.  He has not had syncope, orthopnea, significant leg edema. Symptoms were reviewed with DOD and recommendations for cath the following day as he had no EKG changes. Instructed to present to the ED if symptoms worsened.    Hospital Course   Coronary artery disease involving native coronary artery of native heart with angina pectoris: Underwent cardiac cath noted above with severe native CAD, widely patent LIMA-LAD with collateral flow to RCA/LCx, patent SVG-OM with mild ISR of the body of the graft with occluded downstream stent in the inferior branch of the Lcx with patent OM. Extensively disease RCA with 2 focal segment of 70% and 90% ISR treated with successful scoring balloon and high pressure angioplasty with reduction of 70% to 20%, and 90% to 45%. Worked with CR without recurrent chest pain -- recommendations for continued DAPT with ASA/plavix long term given extensive stenting and ISR  PAD: Status post bilateral common iliac artery stenting by Dr. Fletcher Anon in 2/22.  His claudication has resolved.   Bilateral carotid artery stenosis: Carotid US in 2/22 with 40-59% stenosis bilaterally.  He will have follow-up Dopplers again in 2023.   Essential hypertension: BP elevated after walking with CR.  -- continue metoprolol 50mg  BID, Imdur 120mg  daily -- increase amlodipine to 10mg  daily   Pure hypercholesterolemia: LDL in 3/22 was 69.  ALT was 13.   -- Continue current dose of rosuvastatin.   Type 2 diabetes mellitus with complication: continue PTA meds, metformin and Amaryl   Stage 3b chronic kidney disease: Cr 1.5 post cath, stable  General: Well developed, well nourished, male appearing in no acute distress. Head: Normocephalic, atraumatic.  Neck: Supple without bruits, JVD. Lungs:  Resp regular and unlabored, CTA. Heart: RRR, S1, S2, no S3, S4, or murmur; no rub. Abdomen: Soft, non-tender, non-distended with normoactive bowel sounds. No hepatomegaly. No  rebound/guarding. No obvious abdominal masses. Extremities: No clubbing, cyanosis, edema. Distal pedal pulses are 2+ bilaterally. Left radial cath site stable without bruising or hematoma Neuro: Alert and oriented X 3. Moves all extremities spontaneously. Psych: Normal affect.  Patient was seen by Dr. Burt Knack and deemed stable for discharge home. Follow up in the office arranged. Medications sent to pharmacy of patient choice.    Did the patient have an acute coronary syndrome (MI, NSTEMI, STEMI, etc) this admission?:  No                               Did the patient have a percutaneous coronary intervention (stent / angioplasty)?:  Yes.     Cath/PCI Registry Performance & Quality Measures: Aspirin prescribed? - Yes ADP Receptor Inhibitor (Plavix/Clopidogrel, Brilinta/Ticagrelor or Effient/Prasugrel) prescribed (includes medically managed patients)? - Yes High Intensity Statin (Lipitor 40-80mg  or Crestor 20-40mg ) prescribed? - Yes For EF <40%, was ACEI/ARB prescribed? - Not Applicable (EF >/= 01%) For EF <40%, Aldosterone Antagonist (Spironolactone or Eplerenone) prescribed? - Not Applicable (EF >/= 09%) Cardiac Rehab Phase II ordered? - Yes  _____________  Discharge Vitals Blood pressure (!) 145/69, pulse 62, temperature 98 F (36.7 C), temperature source Oral, resp. rate 15, height 5\' 6"  (1.676 m), weight 78 kg, SpO2 93 %.  Filed Weights   06/29/20 0943  Weight: 78 kg    Labs & Radiologic Studies    CBC Recent Labs    06/28/20 1319 06/30/20 0130  WBC 5.9 5.3  HGB 13.4 12.9*  HCT 39.3 36.2*  MCV 89 87.2  PLT 163 798   Basic Metabolic Panel Recent Labs    06/28/20 1319 06/30/20 0130  NA 140 138  K 4.3 3.5  CL 105 109  CO2 27 25  GLUCOSE 109* 178*  BUN 20 13  CREATININE 1.56* 1.52*  CALCIUM 8.6 8.7*   Liver Function Tests No results for input(s): AST, ALT, ALKPHOS, BILITOT, PROT, ALBUMIN in the last 72 hours. No results for input(s): LIPASE, AMYLASE in the  last 72 hours. High Sensitivity Troponin:   No results for input(s): TROPONINIHS in the last 720 hours.  BNP Invalid input(s): POCBNP D-Dimer No results for input(s): DDIMER in the last 72 hours. Hemoglobin A1C No results for input(s): HGBA1C in the last 72 hours. Fasting Lipid Panel No results for input(s): CHOL, HDL, LDLCALC, TRIG, CHOLHDL, LDLDIRECT in the last 72 hours. Thyroid Function Tests No results for input(s): TSH, T4TOTAL, T3FREE, THYROIDAB in the last 72 hours.  Invalid input(s): FREET3 _____________  CARDIAC CATHETERIZATION  Result Date: 06/29/2020  The left ventricular systolic function is normal.  LV end diastolic pressure is normal.  There is no aortic valve stenosis.  Mid RCA to Dist RCA lesion is 90% stenosed.  Prox RCA-1 lesion is 15% stenosed.  Prox RCA-2 lesion is 70% stenosed.  Prox RCA to Mid RCA lesion is 15% stenosed.  Post intervention, there is a 45% residual stenosis.  Scoring balloon angioplasty was performed using a BALLOON SCOREFLEX 2.50X10.  Post intervention, there is a 10% residual stenosis.  Balloon angioplasty was performed using a BALLOON [place] EUPHORA RX 2.75X8.  Mid LM to Prox LAD lesion is 100% stenosed with 100% stenosed side branch in Ost Cx to Mid Cx.  LIMA.  Mid Cx to Dist Cx lesion is 100% stenosed.  SVG graft was visualized by angiography and is large.  Origin to Prox Graft lesion is 5% stenosed.  Mid Graft to Insertion lesion is 20% stenosed.  SVG graft was visualized by angiography.  Origin to Prox Graft lesion is 100% stenosed.  SUMMARY  Severe native CAD with essentially occluded Left Coronary System, extensively diseased RCA with 2 focal segments of 70% and then 90% in-stent stenosis.  Moderately successful scoring balloon and high-pressure [place] balloon angioplasty of the stented segment of the RCA reducing the 70% stenosis of 20% in the 90% to 45%.  Widely patent LIMA-LAD with collateral flow to the RCA and LCx  Patent SVG-OM  with patent ostial stent, mild in-stent restenosis in the body of the graft with occluded downstream stent in the inferior branch of the LCx with patent OM  Preserved LVEF with no obvious wall motion abnormality; normal LVEDP RECOMMENDATION  He will remain in-house overnight as outpatient with extended recovery with plan to discharge tomorrow  Continue aggressive risk factor modification Glenetta Hew, MD  Disposition   Pt is being discharged home today in good condition.  Follow-up Plans & Appointments     Follow-up Information     Liliane Shi, PA-C Follow up on 07/06/2020.   Specialties: Cardiology, Physician Assistant  Why: at 7:45am for your follow up appt Contact information: [address] [place] [address] [phone]                Discharge Instructions     Amb Referral to Cardiac Rehabilitation   Complete by: As directed    Referral letter to be sent to [facility] CRP 2   Diagnosis: PTCA   After initial evaluation and assessments completed: Virtual Based Care may be provided alone or in conjunction with Phase 2 Cardiac Rehab based on patient barriers.: Yes   Diet - low sodium heart healthy   Complete by: As directed    Discharge instructions   Complete by: As directed    Radial Site Care Refer to this sheet in the next few weeks. These instructions provide you with information on caring for yourself after your procedure. Your caregiver may also give you more specific instructions. Your treatment has been planned according to current medical practices, but problems sometimes occur. Call your caregiver if you have any problems or questions after your procedure. HOME CARE INSTRUCTIONS You may shower the day after the procedure. Remove the bandage (dressing) and gently wash the site with plain soap and water. Gently pat the site dry.  Do not apply powder or lotion to the site.  Do not submerge the affected site in water for 3 to 5 days.   Inspect the site at least twice daily.  Do not flex or bend the affected arm for 24 hours.  No lifting over 5 pounds (2.3 kg) for 5 days after your procedure.  Do not drive home if you are discharged the same day of the procedure. Have someone else drive you.  You may drive 24 hours after the procedure unless otherwise instructed by your caregiver.  What to expect: Any bruising will usually fade within 1 to 2 weeks.  Blood that collects in the tissue (hematoma) may be painful to the touch. It should usually decrease in size and tenderness within 1 to 2 weeks.  SEEK IMMEDIATE MEDICAL CARE IF: You have unusual pain at the radial site.  You have redness, warmth, swelling, or pain at the radial site.  You have drainage (other than a small amount of blood on the dressing).  You have chills.  You have a fever or persistent symptoms for more than 72 hours.  You have a fever and your symptoms suddenly get worse.  Your arm becomes pale, cool, tingly, or numb.  You have heavy bleeding from the site. Hold pressure on the site.   PLEASE DO NOT MISS ANY DOSES OF YOUR PLAVIX!!!!! Also keep a log of you blood pressures and bring back to your follow up appt. Please call the office with any questions.   Patients taking blood thinners should generally stay away from medicines like ibuprofen, Advil, Motrin, naproxen, and Aleve due to risk of stomach bleeding. You may take Tylenol as directed or talk to your primary doctor about alternatives.   PLEASE ENSURE THAT YOU DO NOT RUN OUT OF YOUR PLAVIX. This medication is very important to remain on for at least one year. IF you have issues obtaining this medication due to cost please CALL the office 3-5 business days prior to running out in order to prevent missing doses of this medication.   Increase activity slowly   Complete by: As directed        Discharge Medications   Allergies as of 06/30/2020   No Known Allergies  Medication List      STOP taking these medications    ibuprofen 200 MG tablet Commonly known as: ADVIL       TAKE these medications    acetaminophen 500 MG tablet Commonly known as: TYLENOL Take 500 mg by mouth every 6 (six) hours as needed (pain.).   amLODipine 10 MG tablet Commonly known as: NORVASC Take 1 tablet (10 mg total) by mouth every morning. What changed:  medication strength how much to take   aspirin 81 MG chewable tablet Chew 1 tablet (81 mg total) by mouth daily.   clopidogrel 75 MG tablet Commonly known as: PLAVIX TAKE 1 TABLET BY MOUTH AT BEDTIME What changed: when to take this   furosemide 20 MG tablet Commonly known as: LASIX Take 20 mg by mouth daily as needed for fluid.   glimepiride 2 MG tablet Commonly known as: AMARYL Take 2 mg by mouth in the morning and at bedtime.   icosapent Ethyl 1 g capsule Commonly known as: Vascepa Take 2 capsules (2 g total) by mouth 2 (two) times daily.   isosorbide mononitrate 120 MG 24 hr tablet Commonly known as: IMDUR Take 1 tablet (120 mg total) by mouth daily.   metFORMIN 500 MG 24 hr tablet Commonly known as: GLUCOPHAGE-XR Take 1,000 mg by mouth 2 (two) times daily with a meal.   metoprolol tartrate 50 MG tablet Commonly known as: LOPRESSOR TAKE ONE TABLET TWICE DAILY   nitroGLYCERIN 0.4 MG SL tablet Commonly known as: NITROSTAT Place 1 tablet (0.4 mg total) under the tongue every 5 (five) minutes as needed for chest pain. Up to 3 doses   pantoprazole 40 MG tablet Commonly known as: PROTONIX TAKE ONE (1) TABLET EACH DAY What changed: See the new instructions.   rosuvastatin 20 MG tablet Commonly known as: CRESTOR TAKE 1 TABLET BY MOUTH EVERY DAY         Outstanding Labs/Studies   N/a   Duration of Discharge Encounter   Greater than 30 minutes including physician time.  Signed, Reino Bellis, NP 06/30/2020, 11:35 AM  Patient seen, examined. Available data reviewed. Agree with findings,  assessment, and plan as outlined by Reino Bellis, NP.  The patient is alert, oriented, no distress.  Lungs are clear, heart is regular rate and rhythm with no murmur gallop, abdomen soft and nontender, left radial cath site is clear, extremities have no edema, skin is warm and dry with no rash.  The patient underwent uncomplicated PCI of severe in-stent restenosis treated with [place] Balloon angioplasty yesterday.  His medical program is reviewed and I agree with the recommendations above.  He is medically stable for discharge today.  Sherren Mocha, M.D. 06/30/2020 12:16 PM

## 2020-06-30 NOTE — Plan of Care (Signed)

## 2020-06-30 NOTE — Progress Notes (Signed)
CARDIAC REHAB PHASE I   PRE:  Rate/Rhythm: 61 SR  BP:  Supine: 166/81  Sitting:   Standing:    SaO2: 96%RA  MODE:  Ambulation: 770 ft   POST:  Rate/Rhythm: 79 SR  BP:  Supine: 193/81, 203/73, right arm 192/ 76  changed cuff left arm 192/94  Sitting:   Standing:    SaO2: 96%RA 0812-0920 Pt walked 770 ft on RA with steady gait and no CP. BP elevated as taken several times as documented above. Cardiology NP aware and pt asymptomatic. Education completed with pt who voiced understanding. Reviewed NTG use, plavix, heart healthy food choices and walking for ex. Pt not interested in CRP 2 but referral letter to be sent to [facility] CRP 2 to meet protocol.    Graylon Good, RN BSN  06/30/2020 9:14 AM

## 2020-06-30 NOTE — Plan of Care (Signed)
Problem: Education: Goal: Knowledge of General Education information will improve Description: Including pain rating scale, medication(s)/side effects and non-pharmacologic comfort measures 06/30/2020 1149 by Camillia Herter, RN Outcome: Adequate for Discharge 06/30/2020 0718 by Camillia Herter, RN Outcome: Progressing   Problem: Health Behavior/Discharge Planning: Goal: Ability to manage health-related needs will improve 06/30/2020 1149 by Camillia Herter, RN Outcome: Adequate for Discharge 06/30/2020 0718 by Camillia Herter, RN Outcome: Progressing   Problem: Clinical Measurements: Goal: Ability to maintain clinical measurements within normal limits will improve 06/30/2020 1149 by Camillia Herter, RN Outcome: Adequate for Discharge 06/30/2020 0718 by Camillia Herter, RN Outcome: Progressing Goal: Will remain free from infection 06/30/2020 1149 by Camillia Herter, RN Outcome: Adequate for Discharge 06/30/2020 0718 by Camillia Herter, RN Outcome: Progressing Goal: Diagnostic test results will improve 06/30/2020 1149 by Camillia Herter, RN Outcome: Adequate for Discharge 06/30/2020 0718 by Camillia Herter, RN Outcome: Progressing Goal: Respiratory complications will improve 06/30/2020 1149 by Camillia Herter, RN Outcome: Adequate for Discharge 06/30/2020 [phone] by Camillia Herter, RN Outcome: Progressing Goal: Cardiovascular complication will be avoided 06/30/2020 1149 by Camillia Herter, RN Outcome: Adequate for Discharge 06/30/2020 0718 by Camillia Herter, RN Outcome: Progressing   Problem: Activity: Goal: Risk for activity intolerance will decrease 06/30/2020 1149 by Camillia Herter, RN Outcome: Adequate for Discharge 06/30/2020 [phone] by Camillia Herter, RN Outcome: Progressing   Problem: Nutrition: Goal: Adequate nutrition will be maintained 06/30/2020 1149 by Camillia Herter, RN Outcome: Adequate for Discharge 06/30/2020 0718 by Camillia Herter, RN Outcome: Progressing   Problem:  Coping: Goal: Level of anxiety will decrease 06/30/2020 1149 by Camillia Herter, RN Outcome: Adequate for Discharge 06/30/2020 0718 by Camillia Herter, RN Outcome: Progressing   Problem: Elimination: Goal: Will not experience complications related to bowel motility 06/30/2020 1149 by Camillia Herter, RN Outcome: Adequate for Discharge 06/30/2020 0718 by Camillia Herter, RN Outcome: Progressing Goal: Will not experience complications related to urinary retention 06/30/2020 1149 by Camillia Herter, RN Outcome: Adequate for Discharge 06/30/2020 0718 by Camillia Herter, RN Outcome: Progressing   Problem: Pain Managment: Goal: General experience of comfort will improve 06/30/2020 1149 by Camillia Herter, RN Outcome: Adequate for Discharge 06/30/2020 0718 by Camillia Herter, RN Outcome: Progressing   Problem: Safety: Goal: Ability to remain free from injury will improve 06/30/2020 1149 by Camillia Herter, RN Outcome: Adequate for Discharge 06/30/2020 0718 by Camillia Herter, RN Outcome: Progressing   Problem: Skin Integrity: Goal: Risk for impaired skin integrity will decrease 06/30/2020 1149 by Camillia Herter, RN Outcome: Adequate for Discharge 06/30/2020 0718 by Camillia Herter, RN Outcome: Progressing   Problem: Education: Goal: Understanding of CV disease, CV risk reduction, and recovery process will improve 06/30/2020 1149 by Camillia Herter, RN Outcome: Adequate for Discharge 06/30/2020 0718 by Camillia Herter, RN Outcome: Progressing Goal: Individualized Educational Video(s) 06/30/2020 1149 by Camillia Herter, RN Outcome: Adequate for Discharge 06/30/2020 0718 by Camillia Herter, RN Outcome: Progressing   Problem: Activity: Goal: Ability to return to baseline activity level will improve 06/30/2020 1149 by Camillia Herter, RN Outcome: Adequate for Discharge 06/30/2020 [phone] by Camillia Herter, RN Outcome: Progressing   Problem: Cardiovascular: Goal: Ability to achieve and maintain adequate  cardiovascular perfusion will improve 06/30/2020 1149 by Camillia Herter, RN Outcome: Adequate for Discharge 06/30/2020 0718 by Camillia Herter, RN Outcome: Progressing Goal: Vascular access  site(s) Level 0-1 will be maintained 06/30/2020 1149 by Camillia Herter, RN Outcome: Adequate for Discharge 06/30/2020 [phone] by Camillia Herter, RN Outcome: Progressing   Problem: Health Behavior/Discharge Planning: Goal: Ability to safely manage health-related needs after discharge will improve 06/30/2020 1149 by Camillia Herter, RN Outcome: Adequate for Discharge 06/30/2020 0718 by Camillia Herter, RN Outcome: Progressing

## 2020-07-01 ENCOUNTER — Telehealth (HOSPITAL_COMMUNITY): Payer: Self-pay

## 2020-07-01 NOTE — Telephone Encounter (Signed)
Per phase I cardiac rehab, fax cardiac rehab referral to [facility] cardiac rehab in [place]

## 2020-07-03 NOTE — H&P (Signed)
Brief History and Physical Note:  NAME:  [name]   MRN: [id number] DOB:  21-Feb-1945   ADMIT DATE: 06/29/2020   07/03/2020 3:19 PM  [name] is a 75 y.o. male seen on June 28 by Richardson Dopp, PA for evaluation of symptoms concerning for progressive angina.  Please see full clinic note cosign from yesterday.  HPI: Seen on 06/28/2020 [name] more frequent chest discomfort over the past few weeks.  He did note having some chest pain afterwards.  Procedure back in February 2022.  Was monitored overnight.  Felt to be demand ischemia.  Now noting pressure tightness in the substernal area radiating to the neck.  Concern for progressive angina. ->  Referred for cardiac catheterization.  Past Medical History:  Diagnosis Date   Anemia    Blood transfusion    CAD (coronary artery disease)    a. CABG 1993;  b. PCI of SVG to OM Jan 2012 (Drug-eluting stent x 2);  c. 09/2012 Cath/PCI: LM nl, LAD sev dzs, LCX 100, RCA patent stent, VG->AM 100, LIMA->LAD nl, VG->OM 95p ISR (PTCA only), patent distal stent. // d. LHC 3/18: pLAD 100, oRI 100, pLCx 100, Lat OM 95, RCA stent ok, S-PDA 100, L-LAD ok, S-OM1 stent 80 ISR >> PCI: POBA to S-OM1; DES to native Lat OM1   CKD (chronic kidney disease), stage III (HCC)    Degeneration of lumbar or lumbosacral intervertebral disc    GERD (gastroesophageal reflux disease)    H/O hiatal hernia    History of echocardiogram    Echo 3/18: Mild LVH, EF 60-65, Gr 2 DD   History of nephrolithiasis    Hyperlipidemia    Hyperplastic colon polyp    Hypertension    Myocardial infarction (HCC)    hx of five    PAD (peripheral artery disease) ([place])    LE Arterial US 1/22: R CIA > 50% prox and mid and < 50% distal; R ATA mid occlusion   Stricture and stenosis of esophagus    Past Surgical History:  Procedure Laterality Date   ABDOMINAL AORTOGRAM W/LOWER EXTREMITY N/A 02/17/2020   Procedure: ABDOMINAL AORTOGRAM W/LOWER EXTREMITY;  Surgeon: Wellington Hampshire, MD;   Location: [place] [facility];  Service: Cardiovascular;  Laterality: N/A;   ANGIOPLASTY     BACK SURGERY     CARDIAC CATHETERIZATION     COLONOSCOPY     CORONARY ARTERY BYPASS GRAFT  1993   CORONARY BALLOON ANGIOPLASTY N/A 06/29/2020   Procedure: CORONARY BALLOON ANGIOPLASTY;  Surgeon: Leonie Man, MD;  Location: [place] [facility];  Service: Cardiovascular;  Laterality: N/A;   CORONARY STENT INTERVENTION N/A 03/19/2016   Procedure: Coronary Stent Intervention;  Surgeon: Sherren Mocha, MD;  Location: [place] [facility];  Service: Cardiovascular;  Laterality: N/A;   HOLMIUM LASER APPLICATION Right 2/59/5638   Procedure: HOLMIUM LASER APPLICATION;  Surgeon: Jorja Loa, MD;  Location: [facility];  Service: Urology;  Laterality: Right;   LEFT HEART CATH AND CORS/GRAFTS ANGIOGRAPHY N/A 03/19/2016   Procedure: Left Heart Cath and Cors/Grafts Angiography;  Surgeon: Sherren Mocha, MD;  Location: [place] [facility];  Service: Cardiovascular;  Laterality: N/A;   LEFT HEART CATH AND CORS/GRAFTS ANGIOGRAPHY N/A 06/29/2020   Procedure: LEFT HEART CATH AND CORS/GRAFTS ANGIOGRAPHY;  Surgeon: Leonie Man, MD;  Location: [place] [facility];  Service: Cardiovascular;  Laterality: N/A;   LEFT HEART CATHETERIZATION WITH CORONARY/GRAFT ANGIOGRAM N/A 09/23/2012   Procedure: LEFT HEART CATHETERIZATION WITH CORONARY/GRAFT ANGIOGRAM;  Surgeon: Peter M Martinique, MD;  Location: [facility];  Service: Cardiovascular;  Laterality: N/A;   LITHOTRIPSY     Lumbar back     NEPHROLITHOTOMY Right 09/17/2013   Procedure: NEPHROLITHOTOMY PERCUTANEOUS;  Surgeon: Jorja Loa, MD;  Location: [facility];  Service: Urology;  Laterality: Right;   PERCUTANEOUS CORONARY INTERVENTION-BALLOON ONLY  09/23/2012   Procedure: PERCUTANEOUS CORONARY INTERVENTION-BALLOON ONLY;  Surgeon: Peter M Martinique, MD;  Location: [facility];  Service: Cardiovascular;;  OM vein graft   PERIPHERAL VASCULAR INTERVENTION Bilateral 02/17/2020    Procedure: PERIPHERAL VASCULAR INTERVENTION;  Surgeon: Wellington Hampshire, MD;  Location: [place] [facility];  Service: Cardiovascular;  Laterality: Bilateral;  Iliac stents    FAMHx: Family History  Problem Relation Age of Onset   Coronary artery disease Mother 40       deceased.status post CABG   Kidney cancer Mother    Lung cancer Brother    Lung cancer Sister    CAD Other 67       5 brothers   CAD Other 40       4 sisters   Colon cancer Neg Hx     SOCHx:  reports that he quit smoking about 33 years ago. His smoking use included cigarettes. He has quit using smokeless tobacco. He reports that he does not drink alcohol and does not use drugs.  ALLERGIES: No Known Allergies  HOME MEDICATIONS: No medications prior to admission.    ROS no CHF symptoms of PND orthopnea edema.  No claudication. ROS otherwise negative.  PHYSICAL EXAM:Blood pressure (!) 145/69, pulse 62, temperature 98 F (36.7 C), temperature source Oral, resp. rate 15, height 5\' 6"  (1.676 m), weight 78 kg, SpO2 93 %. General appearance: alert, cooperative, appears stated age, and no distress Neck: no carotid bruit, no JVD, and thyroid not enlarged, symmetric, no tenderness/mass/nodules Lungs: clear to auscultation bilaterally and nonlabored, good air movement Heart: regular rate and rhythm, S1, S2 normal, no murmur, click, rub or gallop and normal apical impulse Extremities: extremities normal, atraumatic, no cyanosis or edema Neurologic: Grossly normal   IMPRESSION & PLAN  [name] has presented today for Cardiac Catheterization, with the diagnosis of known CAD with progressive angina/unstable angina.. The various methods of treatment have been discussed with the patient..   The patients' history has been reviewed, patient examined, no change in status from most recent note, stable for surgery. I have reviewed the patients' chart and labs. Questions were answered to the patient's satisfaction.   Risks  / Complications include, but not limited to: Death, MI, CVA/TIA, VF/VT (with defibrillation), Bradycardia (need for temporary pacer placement), contrast induced nephropathy, bleeding / bruising / hematoma / pseudoaneurysm, vascular or coronary injury (with possible emergent CT or Vascular Surgery), adverse medication reactions, infection.     After consideration of risks, benefits and other options for treatment, the patient has consented to Procedure(s):  LEFT HEART CATHETERIZATION AND CORONARY ANGIOGRAPHY +/- AD [place]  as a surgical intervention.   We will proceed with the planned procedure   Cath Lab Visit (complete for each Cath Lab visit)     Clinical Evaluation Leading to the Procedure:    ACS: No.      Non-ACS:      Anginal Classification: CCS III - Progressive/Unstable Angina      Anti-ischemic medical therapy:  Maximal Therapy (2 or more classes of medications)      Non-Invasive Test Results:  No non-invasive testing performed  Prior CABG:  Previous CABG    [facility] [address] [place], [place]  [address]  [phone]  07/03/2020 3:19 PM

## 2020-07-05 NOTE — Progress Notes (Signed)
Cardiology Office Note:    Date:  07/06/2020   ID:  [name], DOB 31-Jan-1945, MRN [id number]  PCP:  Allean Found, MD   [facility] Providers Cardiologist:  Sherren Mocha, MD Cardiology APP:  Sharmon Revere      Referring MD: Allean Found, MD   Chief Complaint:  Hospitalization Follow-up (S/p PCI)    Patient Profile:    [name] is a 75 y.o. male with:  Coronary artery disease w/ angina S/p CABG in 1993 S/p PCI to S-OM in 2009 and again in 2012, 2014, 2018 S/p POBA to S-OM1 and DES to OM in 2018 S/p POBA to RCA prox and mid ISR 6/22 Heart failure with preserved ejection fraction  Carotid artery disease [place] 2/22: Bilat ICA 40-59 Peripheral arterial disease S/p bilateral CIA stents ext into distal Aorta in 02/2020 Chronic kidney disease Hypertension Hyperlipidemia      Prior CV studies: LEFT HEART CATH 06/29/2020 SUMMARY  Severe native CAD with essentially occluded Left Coronary System, extensively diseased RCA with 2 focal segments of 70% and then 90% in-stent stenosis.  Moderately successful scoring balloon and high-pressure [place] balloon angioplasty of the stented segment of the RCA reducing the 70% stenosis of 20% in the 90% to 45%.  Widely patent LIMA-LAD with collateral flow to the RCA and LCx  Patent SVG-OM with patent ostial stent, mild in-stent restenosis in the body of the graft with occluded downstream stent in the inferior branch of the LCx with patent OM  Preserved LVEF with no obvious wall motion abnormality; normal LVEDP    PV Intervention 02/17/20 1.  Significant distal aortic ulcerated stenosis extending into the ostium of both common iliac arteries with severe stenosis in the distal left common iliac artery. 2.  Successful bilateral kissing common iliac artery stent placement extending into the distal aorta with an additional overlapped self-expanding stent on the left side to cover the whole lesion.   VAS US CAROTID DUPLEX  BILATERAL 02/11/2020 Summary: Bilateral ICA 40-59% L subclavian flow disturbed    Echo 03/20/16 Mild LVH, EF 60-65, Gr 2 DD   LHC 03/19/16 LAD proximal 100 RI ostial 100 LCx proximal 100, lateral OM1 95 RCA stent patent with 30-40 ISR SVG-RPDA 100 LIMA-LAD patent SVG-OM1 proximal stent 80 ISR PCI: POBA to the SVG-OM1;Synergy Des 2.25x16 to the native lateral OM1   History of Present Illness: [name] was last seen 06/28/20.  He was having symptoms of unstable angina and I set him up for a cardiac catheterization.  This demonstrated severe native vessel CAD, patent L-LAD, patent S-OM stented segment, occluded stent in inf branch of LCx, high grade prox RCA stent and mid RCA stent ISR.  He underwent PCI with scoring balloon angioplasty to the pRCA and mRCA.  EF was preserved.  Post cath his BP was uncontrolled and his Amlodipine was increased.  He returns for f/u.  He is here alone.  Since his angioplasty, his angina is much improved.  He still has occasional chest pain from time to time.  He has not had significant shortness of breath.  He sleeps on an incline chronically.  He has not had significant pedal edema.  He has not had syncope.  He does continue to have episodic diaphoresis.  He has had this for quite some time and wonders if it is related to his diabetic medications.  He has not recorded any hypoglycemic episodes.    Past Medical History:  Diagnosis Date   Anemia  Blood transfusion    CAD (coronary artery disease)    a. CABG 1993;  b. PCI of SVG to OM Jan 2012 (Drug-eluting stent x 2);  c. 09/2012 Cath/PCI: LM nl, LAD sev dzs, LCX 100, RCA patent stent, VG->AM 100, LIMA->LAD nl, VG->OM 95p ISR (PTCA only), patent distal stent. // d. LHC 3/18: pLAD 100, oRI 100, pLCx 100, Lat OM 95, RCA stent ok, S-PDA 100, L-LAD ok, S-OM1 stent 80 ISR >> PCI: POBA to S-OM1; DES to native Lat OM1   CKD (chronic kidney disease), stage III (HCC)    Degeneration of lumbar or lumbosacral  intervertebral disc    GERD (gastroesophageal reflux disease)    H/O hiatal hernia    History of echocardiogram    Echo 3/18: Mild LVH, EF 60-65, Gr 2 DD   History of nephrolithiasis    Hyperlipidemia    Hyperplastic colon polyp    Hypertension    Myocardial infarction (HCC)    hx of five    PAD (peripheral artery disease) ([place])    LE Arterial US 1/22: R CIA > 50% prox and mid and < 50% distal; R ATA mid occlusion   Stricture and stenosis of esophagus     Current Medications: Current Meds  Medication Sig   acetaminophen (TYLENOL) 500 MG tablet Take 500 mg by mouth every 6 (six) hours as needed (pain.).   amLODipine (NORVASC) 10 MG tablet Take 1 tablet (10 mg total) by mouth every morning.   aspirin 81 MG chewable tablet Chew 1 tablet (81 mg total) by mouth daily.   clopidogrel (PLAVIX) 75 MG tablet TAKE 1 TABLET BY MOUTH AT BEDTIME   furosemide (LASIX) 20 MG tablet Take 20 mg by mouth daily as needed for fluid.   glimepiride (AMARYL) 2 MG tablet Take 2 mg by mouth in the morning and at bedtime.   icosapent Ethyl (VASCEPA) 1 g capsule Take 2 capsules (2 g total) by mouth 2 (two) times daily.   isosorbide mononitrate (IMDUR) 120 MG 24 hr tablet Take 1 tablet (120 mg total) by mouth daily.   metFORMIN (GLUCOPHAGE-XR) 500 MG 24 hr tablet Take 1,000 mg by mouth 2 (two) times daily with a meal.   metoprolol tartrate (LOPRESSOR) 50 MG tablet TAKE ONE TABLET TWICE DAILY   nitroGLYCERIN (NITROSTAT) 0.4 MG SL tablet Place 1 tablet (0.4 mg total) under the tongue every 5 (five) minutes as needed for chest pain. Up to 3 doses   pantoprazole (PROTONIX) 40 MG tablet TAKE ONE (1) TABLET EACH DAY   rosuvastatin (CRESTOR) 20 MG tablet TAKE 1 TABLET BY MOUTH EVERY DAY     Allergies:   Patient has no known allergies.   Social History   Tobacco Use   Smoking status: Former    Pack years: 0.00    Types: Cigarettes    Quit date: 01/06/1987    Years since quitting: 33.5   Smokeless tobacco: Former   Scientific laboratory technician Use: Never used  Substance Use Topics   Alcohol use: No   Drug use: No     Family Hx: The patient's family history includes CAD (age of onset: 62) in some other family members; Coronary artery disease (age of onset: 38) in his mother; Kidney cancer in his mother; Lung cancer in his brother and sister. There is no history of Colon cancer.  Review of Systems  Constitutional: Positive for diaphoresis.  Skin:  Positive for suspicious lesions (R neck).    EKGs/Labs/Other Test  Reviewed:    EKG:  EKG is   ordered today.  The ekg ordered today demonstrates sinus rhythm, heart rate 67, left axis deviation, poor R wave progression, no ST-T wave changes, QTC 426  Recent Labs: 03/29/2020: ALT 13 06/30/2020: BUN 13; Creatinine, Ser 1.52; Hemoglobin 12.9; Platelets 150; Potassium 3.5; Sodium 138   Recent Lipid Panel Lab Results  Component Value Date/Time   CHOL 123 03/29/2020 07:31 AM   TRIG 149 03/29/2020 07:31 AM   HDL 28 (L) 03/29/2020 07:31 AM   LDLCALC 69 03/29/2020 07:31 AM   LDLDIRECT 122.5 12/02/2013 09:22 AM      Risk Assessment/Calculations:      Physical Exam:    VS:  BP 122/64   Pulse 67   Ht 5\' 6"  (1.676 m)   Wt 169 lb (76.7 kg)   SpO2 98%   BMI 27.28 kg/m     Wt Readings from Last 3 Encounters:  07/06/20 169 lb (76.7 kg)  06/29/20 172 lb (78 kg)  06/28/20 172 lb 6.4 oz (78.2 kg)     Constitutional:      Appearance: Healthy appearance. Not in distress.  Neck:     Vascular: JVD normal.  Pulmonary:     Effort: Pulmonary effort is normal.     Breath sounds: No wheezing. Rales (?crackles R base) present.  Cardiovascular:     Normal rate. Regular rhythm. Normal S1. Normal S2.      Murmurs: There is no murmur.     Comments: Left wrist without hematoma Edema:    Peripheral edema absent.  Abdominal:     Palpations: Abdomen is soft. There is no hepatomegaly.  Skin:    General: Skin is warm and dry.     Comments: Lesion on R neck -  suspicious for squamous cell CA  Neurological:     General: No focal deficit present.     Mental Status: Alert and oriented to person, place and time.     Cranial Nerves: Cranial nerves are intact.        ASSESSMENT & PLAN:    1. Coronary artery disease involving native coronary artery of native heart without angina pectoris S/p CABG 1993 and multiple PCI procedures since.  He is now status post angioplasty to in-stent restenosis in the RCA.  His LIMA-LAD is patent and his vein graft to the OM is patent.  The OM is occluded beyond the insertion of the graft.  He is currently doing well without significant recurrent angina.  He still has occasional chest discomfort from time to time.  Continue current dose of amlodipine, aspirin, clopidogrel, isosorbide mononitrate, metoprolol tartrate, rosuvastatin.  Follow-up in 6 months or sooner if needed.  2. Chronic heart failure with preserved ejection fraction (HCC) Volume status currently stable.  Continue current dose of furosemide.  3. Stage 3b chronic kidney disease ([place]) Obtain follow-up BMET today.  4. Essential hypertension The patient's blood pressure is controlled on his current regimen.  Continue current therapy.   5. Pure hypercholesterolemia LDL optimal on most recent lab work.  Continue current Rx.    6. Lung field abnormal finding on examination He has crackles in his right base on exam.  He also notes a long history of episodic diaphoresis.  I will obtain a chest x-ray.  7. Skin lesion The lesion on his right neck appears to be consistent with squamous cell carcinoma.  He has follow-up with primary care soon and will discuss this at that visit.  8. Diaphoresis  He has had a long history of this.  He wonders if it is related to his diabetic medications.  He has not recorded any hypoglycemic episodes.  I will obtain a TSH with his lab work today.  I have encouraged him to follow-up with primary care to investigate this further.      Dispo:  Return in about 6 months (around 01/06/2021) for Routine follow up in 6 months with Richardson Dopp, PA-C..   Medication Adjustments/Labs and Tests Ordered: Current medicines are reviewed at length with the patient today.  Concerns regarding medicines are outlined above.  Tests Ordered: Orders Placed This Encounter  Procedures   DG Chest 2 View   Basic Metabolic Panel (BMET)   TSH   EKG 12-Lead   Medication Changes: No orders of the defined types were placed in this encounter.   Signed, Richardson Dopp, PA-C  07/06/2020 8:37 AM    [facility] [address], [place], [place]  [address] Phone: [phone]; Fax: [phone]

## 2020-07-06 ENCOUNTER — Encounter: Payer: Self-pay | Admitting: Physician Assistant

## 2020-07-06 ENCOUNTER — Other Ambulatory Visit: Payer: Self-pay

## 2020-07-06 ENCOUNTER — Ambulatory Visit
Admission: RE | Admit: 2020-07-06 | Discharge: 2020-07-06 | Disposition: A | Discharge status: Home / Self Care | Payer: Medicare Other | Source: Ambulatory Visit | Attending: Physician Assistant | Admitting: Physician Assistant

## 2020-07-06 ENCOUNTER — Ambulatory Visit (INDEPENDENT_AMBULATORY_CARE_PROVIDER_SITE_OTHER): Payer: Medicare Other | Admitting: Physician Assistant

## 2020-07-06 VITALS — BP 122/64 | HR 67 | Ht 66.0 in | Wt 169.0 lb

## 2020-07-06 DIAGNOSIS — R918 Other nonspecific abnormal finding of lung field: Secondary | ICD-10-CM

## 2020-07-06 DIAGNOSIS — E7800 Pure hypercholesterolemia, unspecified: Secondary | ICD-10-CM | POA: Diagnosis not present

## 2020-07-06 DIAGNOSIS — I5032 Chronic diastolic (congestive) heart failure: Secondary | ICD-10-CM

## 2020-07-06 DIAGNOSIS — R61 Generalized hyperhidrosis: Secondary | ICD-10-CM | POA: Diagnosis not present

## 2020-07-06 DIAGNOSIS — N1832 Chronic kidney disease, stage 3b: Secondary | ICD-10-CM

## 2020-07-06 DIAGNOSIS — I2510 Atherosclerotic heart disease of native coronary artery without angina pectoris: Secondary | ICD-10-CM

## 2020-07-06 DIAGNOSIS — L989 Disorder of the skin and subcutaneous tissue, unspecified: Secondary | ICD-10-CM

## 2020-07-06 DIAGNOSIS — I10 Essential (primary) hypertension: Secondary | ICD-10-CM

## 2020-07-06 DIAGNOSIS — Z951 Presence of aortocoronary bypass graft: Secondary | ICD-10-CM | POA: Diagnosis not present

## 2020-07-06 DIAGNOSIS — I6523 Occlusion and stenosis of bilateral carotid arteries: Secondary | ICD-10-CM

## 2020-07-06 LAB — BASIC METABOLIC PANEL
BUN/Creatinine Ratio: 14 (ref 10–24)
BUN: 23 mg/dL (ref 8–27)
CO2: 21 mmol/L (ref 20–29)
Calcium: 10 mg/dL (ref 8.6–10.2)
Chloride: 103 mmol/L (ref 96–106)
Creatinine, Ser: 1.63 mg/dL — ABNORMAL HIGH (ref 0.76–1.27)
Glucose: 124 mg/dL — ABNORMAL HIGH (ref 65–99)
Potassium: 4.4 mmol/L (ref 3.5–5.2)
Sodium: 140 mmol/L (ref 134–144)
eGFR: 44 mL/min/{1.73_m2} — ABNORMAL LOW (ref >59)

## 2020-07-06 LAB — TSH: TSH: 3.65 u[IU]/mL (ref 0.450–4.500)

## 2020-07-06 NOTE — Patient Instructions (Addendum)
Medication Instructions:   Your physician recommends that you continue on your current medications as directed. Please refer to the Current Medication list given to you today.  *If you need a refill on your cardiac medications before your next appointment, please call your pharmacy*   Lab Work:  TODAY!!!!!  BMET/TSH  If you have labs (blood work) drawn today and your tests are completely normal, you will receive your results only by: [place] (if you have MyChart) OR A paper copy in the mail If you have any lab test that is abnormal or we need to change your treatment, we will call you to review the results.   Testing/Procedures:  A chest x-ray takes a picture of the organs and structures inside the chest, including the heart, lungs, and blood vessels. This test can show several things, including, whether the heart is enlarges; whether fluid is building up in the lungs; and whether pacemaker / defibrillator leads are still in place. Go to [address] [facility].  Make left out of parking lot, second light, [place] make right, [address] [employer] make left go to front of building and walk in for Chest Xray.    Follow-Up: At [facility], you and your health needs are our priority.  As part of our continuing mission to provide you with exceptional heart care, we have created designated Provider Care Teams.  These Care Teams include your primary Cardiologist (physician) and Advanced Practice Providers (APPs -  Physician Assistants and Nurse Practitioners) who all work together to provide you with the care you need, when you need it.  We recommend signing up for the patient portal called "MyChart".  Sign up information is provided on this After Visit Summary.  MyChart is used to connect with patients for Virtual Visits (Telemedicine).  Patients are able to view lab/test results, encounter notes, upcoming appointments, etc.  Non-urgent messages can be sent to your provider as well.    To learn more about what you can do with MyChart, go to [email or web address].    Your next appointment:   Richardson Dopp, PA-C on Tuesday, December 20 @ 8:15 am.    The format for your next appointment:   In Person  Provider:   Richardson Dopp, PA-C   Other Instructions  Please follow up with your PCP for sweating and nodule on neck.

## 2020-07-07 LAB — GLUCOSE, CAPILLARY: Glucose-Capillary: 203 mg/dL — ABNORMAL HIGH (ref 70–99)

## 2020-07-08 ENCOUNTER — Other Ambulatory Visit: Payer: Self-pay | Admitting: Physician Assistant

## 2020-07-08 DIAGNOSIS — I10 Essential (primary) hypertension: Secondary | ICD-10-CM

## 2020-07-09 ENCOUNTER — Other Ambulatory Visit: Payer: Self-pay | Admitting: Cardiovascular Disease

## 2020-07-16 ENCOUNTER — Other Ambulatory Visit: Payer: Self-pay | Admitting: Cardiovascular Disease

## 2020-07-18 NOTE — Telephone Encounter (Signed)
Outpatient Medication Detail   Disp Refills Start End   metoprolol tartrate (LOPRESSOR) 50 MG tablet 180 tablet 3 07/21/2019    Sig: TAKE ONE TABLET TWICE DAILY   Sent to pharmacy as: metoprolol tartrate (LOPRESSOR) 50 MG tablet   E-Prescribing Status: Receipt confirmed by pharmacy (07/21/2019  9:16 AM EDT)     Pharmacy  [id number] - [place], [place]

## 2020-08-01 ENCOUNTER — Other Ambulatory Visit: Payer: Self-pay | Admitting: Cardiovascular Disease

## 2020-08-01 ENCOUNTER — Other Ambulatory Visit: Payer: Self-pay | Admitting: Physician Assistant

## 2020-08-01 ENCOUNTER — Encounter: Payer: Self-pay | Admitting: Gastroenterology

## 2020-08-01 DIAGNOSIS — I10 Essential (primary) hypertension: Secondary | ICD-10-CM

## 2020-08-01 DIAGNOSIS — I25110 Atherosclerotic heart disease of native coronary artery with unstable angina pectoris: Secondary | ICD-10-CM

## 2020-08-01 DIAGNOSIS — E785 Hyperlipidemia, unspecified: Secondary | ICD-10-CM

## 2020-08-02 DIAGNOSIS — C4442 Squamous cell carcinoma of skin of scalp and neck: Secondary | ICD-10-CM | POA: Diagnosis not present

## 2020-08-02 DIAGNOSIS — L578 Other skin changes due to chronic exposure to nonionizing radiation: Secondary | ICD-10-CM | POA: Diagnosis not present

## 2020-08-02 DIAGNOSIS — L570 Actinic keratosis: Secondary | ICD-10-CM | POA: Diagnosis not present

## 2020-08-02 DIAGNOSIS — L821 Other seborrheic keratosis: Secondary | ICD-10-CM | POA: Diagnosis not present

## 2020-09-01 DIAGNOSIS — C4442 Squamous cell carcinoma of skin of scalp and neck: Secondary | ICD-10-CM | POA: Diagnosis not present

## 2020-09-20 ENCOUNTER — Other Ambulatory Visit: Payer: Self-pay

## 2020-09-20 ENCOUNTER — Ambulatory Visit (HOSPITAL_COMMUNITY)
Admission: RE | Admit: 2020-09-20 | Discharge: 2020-09-20 | Disposition: A | Discharge status: Home / Self Care | Payer: Medicare Other | Source: Ambulatory Visit | Attending: Cardiology | Admitting: Cardiology

## 2020-09-20 ENCOUNTER — Other Ambulatory Visit (HOSPITAL_COMMUNITY): Payer: Self-pay | Admitting: Cardiovascular Disease

## 2020-09-20 ENCOUNTER — Ambulatory Visit (HOSPITAL_BASED_OUTPATIENT_CLINIC_OR_DEPARTMENT_OTHER)
Admission: RE | Admit: 2020-09-20 | Discharge: 2020-09-20 | Disposition: A | Discharge status: Home / Self Care | Payer: Medicare Other | Source: Ambulatory Visit | Attending: Cardiology | Admitting: Cardiology

## 2020-09-20 DIAGNOSIS — Z95828 Presence of other vascular implants and grafts: Secondary | ICD-10-CM | POA: Diagnosis not present

## 2020-09-20 DIAGNOSIS — I739 Peripheral vascular disease, unspecified: Secondary | ICD-10-CM | POA: Insufficient documentation

## 2020-10-11 ENCOUNTER — Other Ambulatory Visit: Payer: Self-pay

## 2020-10-11 ENCOUNTER — Encounter: Payer: Self-pay | Admitting: Cardiovascular Disease

## 2020-10-11 ENCOUNTER — Ambulatory Visit (INDEPENDENT_AMBULATORY_CARE_PROVIDER_SITE_OTHER): Payer: Medicare Other | Admitting: Cardiovascular Disease

## 2020-10-11 VITALS — BP 146/75 | HR 52 | Ht 66.0 in | Wt 173.6 lb

## 2020-10-11 DIAGNOSIS — I25118 Atherosclerotic heart disease of native coronary artery with other forms of angina pectoris: Secondary | ICD-10-CM | POA: Diagnosis not present

## 2020-10-11 DIAGNOSIS — I10 Essential (primary) hypertension: Secondary | ICD-10-CM | POA: Diagnosis not present

## 2020-10-11 DIAGNOSIS — E785 Hyperlipidemia, unspecified: Secondary | ICD-10-CM

## 2020-10-11 DIAGNOSIS — R0989 Other specified symptoms and signs involving the circulatory and respiratory systems: Secondary | ICD-10-CM

## 2020-10-11 DIAGNOSIS — I739 Peripheral vascular disease, unspecified: Secondary | ICD-10-CM

## 2020-10-11 DIAGNOSIS — I6523 Occlusion and stenosis of bilateral carotid arteries: Secondary | ICD-10-CM | POA: Diagnosis not present

## 2020-10-11 DIAGNOSIS — N1832 Chronic kidney disease, stage 3b: Secondary | ICD-10-CM

## 2020-10-11 DIAGNOSIS — Z006 Encounter for examination for normal comparison and control in clinical research program: Secondary | ICD-10-CM

## 2020-10-11 NOTE — Research (Signed)
Called patient to discuss Lpa study and spouse stated he was at a MD appointment. Will try to call back later

## 2020-10-11 NOTE — Progress Notes (Signed)
Cardiology Office Note   Date:  10/11/2020   ID:  [name], DOB 1945-04-21, MRN [id number]  PCP:  Allean Found, MD  Cardiologist: Dr. Burt Knack  No chief complaint on file.     History of Present Illness: [name] is a 75 y.o. male who is here today for follow-up visit regarding peripheral arterial disease.   He has known history of coronary artery disease status post CABG in 1993 with multiple subsequent PCI's, chronic diastolic heart failure, chronic kidney disease, essential hypertension and hyperlipidemia.  He was treated for severe bilateral hip and thigh claudication early this year.  Angiography in February of this year showed significant distal aortic ulcerated stenosis extending into the ostium of both common iliac arteries with severe stenosis in the distal left common iliac artery.  I performed successful bilateral kissing common iliac artery stent placement extending into the distal aorta with an additional overlap self-expanding stent on the left side.   No recurrent claudication since then.  He has extensive cardiac history with chronic angina.  Cardiac catheterization was done by Dr. Ellyn Hack in June which showed severe underlying three-vessel coronary artery disease with patent LIMA to LAD and patent SVG to OM with chronically occluded SVG to RCA.  Patent RCA stents with significant in-stent restenosis distally and a calcified vessel.  The patient underwent balloon angioplasty without stent placement with some improvement in stenosis.  However, the patient reports no improvement in angina after that.   Past Medical History:  Diagnosis Date   Anemia    Blood transfusion    CAD (coronary artery disease)    a. CABG 1993;  b. PCI of SVG to OM Jan 2012 (Drug-eluting stent x 2);  c. 09/2012 Cath/PCI: LM nl, LAD sev dzs, LCX 100, RCA patent stent, VG->AM 100, LIMA->LAD nl, VG->OM 95p ISR (PTCA only), patent distal stent. // d. LHC 3/18: pLAD 100, oRI 100, pLCx 100, Lat  OM 95, RCA stent ok, S-PDA 100, L-LAD ok, S-OM1 stent 80 ISR >> PCI: POBA to S-OM1; DES to native Lat OM1   CKD (chronic kidney disease), stage III (HCC)    Degeneration of lumbar or lumbosacral intervertebral disc    GERD (gastroesophageal reflux disease)    H/O hiatal hernia    History of echocardiogram    Echo 3/18: Mild LVH, EF 60-65, Gr 2 DD   History of nephrolithiasis    Hyperlipidemia    Hyperplastic colon polyp    Hypertension    Myocardial infarction (HCC)    hx of five    PAD (peripheral artery disease) ([place])    LE Arterial US 1/22: R CIA > 50% prox and mid and < 50% distal; R ATA mid occlusion   Stricture and stenosis of esophagus     Past Surgical History:  Procedure Laterality Date   ABDOMINAL AORTOGRAM W/LOWER EXTREMITY N/A 02/17/2020   Procedure: ABDOMINAL AORTOGRAM W/LOWER EXTREMITY;  Surgeon: Wellington Hampshire, MD;  Location: [place] [facility];  Service: Cardiovascular;  Laterality: N/A;   ANGIOPLASTY     BACK SURGERY     CARDIAC CATHETERIZATION     COLONOSCOPY     CORONARY ARTERY BYPASS GRAFT  1993   CORONARY BALLOON ANGIOPLASTY N/A 06/29/2020   Procedure: CORONARY BALLOON ANGIOPLASTY;  Surgeon: Leonie Man, MD;  Location: [place] [facility];  Service: Cardiovascular;  Laterality: N/A;   CORONARY STENT INTERVENTION N/A 03/19/2016   Procedure: Coronary Stent Intervention;  Surgeon: Sherren Mocha, MD;  Location: [place]  [facility];  Service: Cardiovascular;  Laterality: N/A;   HOLMIUM LASER APPLICATION Right 0/97/3532   Procedure: HOLMIUM LASER APPLICATION;  Surgeon: Jorja Loa, MD;  Location: [facility];  Service: Urology;  Laterality: Right;   LEFT HEART CATH AND CORS/GRAFTS ANGIOGRAPHY N/A 03/19/2016   Procedure: Left Heart Cath and Cors/Grafts Angiography;  Surgeon: Sherren Mocha, MD;  Location: [place] [facility];  Service: Cardiovascular;  Laterality: N/A;   LEFT HEART CATH AND CORS/GRAFTS ANGIOGRAPHY N/A 06/29/2020   Procedure: LEFT HEART CATH AND  CORS/GRAFTS ANGIOGRAPHY;  Surgeon: Leonie Man, MD;  Location: [place] [facility];  Service: Cardiovascular;  Laterality: N/A;   LEFT HEART CATHETERIZATION WITH CORONARY/GRAFT ANGIOGRAM N/A 09/23/2012   Procedure: LEFT HEART CATHETERIZATION WITH Beatrix Fetters;  Surgeon: Peter M Martinique, MD;  Location: [facility];  Service: Cardiovascular;  Laterality: N/A;   LITHOTRIPSY     Lumbar back     NEPHROLITHOTOMY Right 09/17/2013   Procedure: NEPHROLITHOTOMY PERCUTANEOUS;  Surgeon: Jorja Loa, MD;  Location: [facility];  Service: Urology;  Laterality: Right;   PERCUTANEOUS CORONARY INTERVENTION-BALLOON ONLY  09/23/2012   Procedure: PERCUTANEOUS CORONARY INTERVENTION-BALLOON ONLY;  Surgeon: Peter M Martinique, MD;  Location: [facility];  Service: Cardiovascular;;  OM vein graft   PERIPHERAL VASCULAR INTERVENTION Bilateral 02/17/2020   Procedure: PERIPHERAL VASCULAR INTERVENTION;  Surgeon: Wellington Hampshire, MD;  Location: [place] [facility];  Service: Cardiovascular;  Laterality: Bilateral;  Iliac stents     Current Outpatient Medications  Medication Sig Dispense Refill   acetaminophen (TYLENOL) 500 MG tablet Take 500 mg by mouth every 6 (six) hours as needed (pain.).     amLODipine (NORVASC) 10 MG tablet Take 1 tablet (10 mg total) by mouth every morning. 90 tablet 1   amLODipine (NORVASC) 5 MG tablet TAKE 1 TABLET BY MOUTH EVERY DAY IN THE MORNING 90 tablet 3   aspirin 81 MG chewable tablet Chew 1 tablet (81 mg total) by mouth daily. 90 tablet 1   clopidogrel (PLAVIX) 75 MG tablet TAKE 1 TABLET BY MOUTH AT BEDTIME 90 tablet 3   furosemide (LASIX) 20 MG tablet Take 20 mg by mouth daily as needed for fluid.     glimepiride (AMARYL) 2 MG tablet Take 2 mg by mouth in the morning and at bedtime.     icosapent Ethyl (VASCEPA) 1 g capsule Take 2 capsules (2 g total) by mouth 2 (two) times daily. 120 capsule 6   isosorbide mononitrate (IMDUR) 120 MG 24 hr tablet Take 1 tablet (120 mg total) by  mouth daily. 90 tablet 1   metFORMIN (GLUCOPHAGE-XR) 500 MG 24 hr tablet Take 1,000 mg by mouth 2 (two) times daily with a meal.     metoprolol tartrate (LOPRESSOR) 50 MG tablet TAKE 1 TABLET BY MOUTH TWICE A DAY 180 tablet 3   nitroGLYCERIN (NITROSTAT) 0.4 MG SL tablet Place 1 tablet (0.4 mg total) under the tongue every 5 (five) minutes as needed for chest pain. Up to 3 doses 25 tablet 1   pantoprazole (PROTONIX) 40 MG tablet TAKE ONE (1) TABLET EACH DAY 90 tablet 3   rosuvastatin (CRESTOR) 20 MG tablet TAKE 1 TABLET BY MOUTH EVERY DAY 90 tablet 3   sulfamethoxazole-trimethoprim (BACTRIM DS) 800-160 MG tablet Take 1 tablet by mouth 2 (two) times daily.     No current facility-administered medications for this visit.    Allergies:   Patient has no known allergies.    Social History:  The patient  reports that he quit smoking about 33 years ago. His smoking use included cigarettes. He has quit using smokeless tobacco. He reports that he does not drink alcohol and does not use drugs.   Family History:  The patient's family history includes CAD (age of onset: 15) in some other family members; Coronary artery disease (age of onset: 50) in his mother; Kidney cancer in his mother; Lung cancer in his brother and sister.    ROS:  Please see the history of present illness.   Otherwise, review of systems are positive for none.   All other systems are reviewed and negative.    PHYSICAL EXAM: VS:  BP (!) 146/75 (BP Location: Left Arm, Patient Position: Sitting, Cuff Size: Large)   Pulse (!) 52   Ht 5\' 6"  (1.676 m)   Wt 173 lb 9.6 oz (78.7 kg)   SpO2 98%   BMI 28.02 kg/m  , BMI Body mass index is 28.02 kg/m. GEN: Well nourished, well developed, in no acute distress  HEENT: normal  Neck: no JVD, or masses.  Right carotid bruit Cardiac: RRR; no murmurs, rubs, or gallops,no edema  Respiratory:  clear to auscultation bilaterally, normal work of breathing GI: soft, nontender, nondistended, +  BS MS: no deformity or atrophy  Skin: warm and dry, no rash Neuro:  Strength and sensation are intact Psych: euthymic mood, full affect   EKG:  EKG is ordered today. EKG showed sinus bradycardia with no significant ST or T wave changes.  Possible old inferior infarct.   Recent Labs: 03/29/2020: ALT 13 06/30/2020: Hemoglobin 12.9; Platelets 150 07/06/2020: BUN 23; Creatinine, Ser 1.63; Potassium 4.4; Sodium 140; TSH 3.650    Lipid Panel    Component Value Date/Time   CHOL 123 03/29/2020 0731   TRIG 149 03/29/2020 0731   HDL 28 (L) 03/29/2020 0731   CHOLHDL 4.4 03/29/2020 0731   CHOLHDL 4.4 01/11/2015 0820   VLDL 24 01/11/2015 0820   LDLCALC 69 03/29/2020 0731   LDLDIRECT 122.5 12/02/2013 0922      Wt Readings from Last 3 Encounters:  10/11/20 173 lb 9.6 oz (78.7 kg)  07/06/20 169 lb (76.7 kg)  06/29/20 172 lb (78 kg)        No flowsheet data found.    ASSESSMENT AND PLAN:  1.  Peripheral arterial disease: Status post bilateral common iliac artery stent placement extending into the distal aorta.  Most recent Doppler studies last month showed normal ABI with patent stents without restenosis.  Repeat studies next year in September.  2.  Coronary artery disease involving native coronary arteries with chronic angina.  Status post CABG and multiple PCI's.  He reports no significant improvement in symptoms after most recent balloon angioplasty of the right coronary artery.  Suspect that the lesion there is still significantly underexpanded if PCI is needed in the future there, should consider intravascular lithotripsy.  3.  Essential hypertension: Blood pressure is reasonably controlled on current medications.  4.  Hyperlipidemia: Currently on rosuvastatin and Vascepa.  Most recent lipid profile showed an LDL of 54.    5.  Chronic kidney disease: This has been stable.  6.  Carotid artery disease with right carotid bruit: Most recent carotid Doppler in February showed  moderate 40 to 59% stenosis bilaterally .  I requested a repeat carotid Doppler to be done in February 2023.    Disposition:   FU with me in 12 months  Signed,  [name], MD  10/11/2020 10:54 AM  [facility]

## 2020-10-11 NOTE — Patient Instructions (Signed)
Medication Instructions:  No changes *If you need a refill on your cardiac medications before your next appointment, please call your pharmacy*   Lab Work: None ordered If you have labs (blood work) drawn today and your tests are completely normal, you will receive your results only by: [place] (if you have MyChart) OR A paper copy in the mail If you have any lab test that is abnormal or we need to change your treatment, we will call you to review the results.   Testing/Procedures: Your physician has requested that you have a carotid duplex in February. This test is an ultrasound of the carotid arteries in your neck. It looks at blood flow through these arteries that supply the brain with blood. Allow one hour for this exam. There are no restrictions or special instructions. This will take place at [place], [address].  Follow-Up: At [facility], you and your health needs are our priority.  As part of our continuing mission to provide you with exceptional heart care, we have created designated Provider Care Teams.  These Care Teams include your primary Cardiologist (physician) and Advanced Practice Providers (APPs -  Physician Assistants and Nurse Practitioners) who all work together to provide you with the care you need, when you need it.  We recommend signing up for the patient portal called "MyChart".  Sign up information is provided on this After Visit Summary.  MyChart is used to connect with patients for Virtual Visits (Telemedicine).  Patients are able to view lab/test results, encounter notes, upcoming appointments, etc.  Non-urgent messages can be sent to your provider as well.   To learn more about what you can do with MyChart, go to [email or web address].    Your next appointment:   12 month(s)  The format for your next appointment:   In Person  Provider:   [name], MD

## 2020-12-20 ENCOUNTER — Ambulatory Visit: Payer: Medicare Other | Admitting: Physician Assistant

## 2020-12-28 ENCOUNTER — Other Ambulatory Visit: Payer: Self-pay | Admitting: Physician Assistant

## 2020-12-28 ENCOUNTER — Other Ambulatory Visit: Payer: Self-pay | Admitting: Cardiology

## 2020-12-28 DIAGNOSIS — E785 Hyperlipidemia, unspecified: Secondary | ICD-10-CM

## 2020-12-28 DIAGNOSIS — I10 Essential (primary) hypertension: Secondary | ICD-10-CM

## 2020-12-28 DIAGNOSIS — I25110 Atherosclerotic heart disease of native coronary artery with unstable angina pectoris: Secondary | ICD-10-CM

## 2021-01-10 DIAGNOSIS — I5032 Chronic diastolic (congestive) heart failure: Secondary | ICD-10-CM | POA: Insufficient documentation

## 2021-01-10 NOTE — Progress Notes (Signed)
Cardiology Office Note:    Date:  01/11/2021   ID:  [name], DOB 04/25/1945, MRN [id number]  PCP:  Allean Found, MD   [facility] Providers Cardiologist:  Sherren Mocha, MD Cardiology APP:  Sharmon Revere     Referring MD: Allean Found, MD   Chief Complaint:  Follow-up for CAD    Patient Profile:   [name] is a 76 y.o. male with:  Coronary artery disease w/ angina S/p CABG in 1993 S/p PCI to S-OM in 2009 and again in 2012, 2014, 2018 S/p POBA to S-OM1 and DES to OM in 2018 S/p POBA to RCA prox and mid ISR 6/22 Heart failure with preserved ejection fraction  Carotid artery disease [place] 2/22: Bilat ICA 40-59; L subclavian stenosis  Peripheral arterial disease S/p bilateral CIA stents ext into distal Aorta in 02/2020 Chronic kidney disease Hypertension Hyperlipidemia   Prior CV studies: LEFT HEART CATH 06/29/2020 LAD 100 LCx 100 RCA 70 and 90 ISR L-LAD patent S-OM ostial stent patent, dist stent patent with mild ISR, dist LCx 100 beyond graft S-RCA occluded  PCI: scoring balloon POBA to RCA ISR   PV Intervention 02/17/20 1.  Significant distal aortic ulcerated stenosis extending into the ostium of both common iliac arteries with severe stenosis in the distal left common iliac artery. 2.  Successful bilateral kissing common iliac artery stent placement extending into the distal aorta with an additional overlapped self-expanding stent on the left side to cover the whole lesion.   VAS US CAROTID DUPLEX BILATERAL 02/11/2020 Bilat ICA 40-59; L subclavian stenosis   Echo 03/20/16 Mild LVH, EF 60-65, Gr 2 DD   History of Present Illness: Mr. Danielson was last seen in 7/22.  He returns for follow-up.  He is here alone.  He continues to have occasional episodes of chest discomfort.  These typically occur at rest.  Sometimes it only lasts seconds.  He has had some symptoms that required nitroglycerin.  Sometimes he can go a couple of weeks before he has  symptoms.  He does not feel that his symptoms are as severe as his symptoms prior to his recent PCI.  He has not had shortness of breath, syncope, orthopnea, leg edema.  ASSESSMENT & PLAN:   CAD (coronary artery disease) S/p CABG 1993 and multiple PCI procedures since.  His last PCI was in 6/22 and he underwent angioplasty to in-stent restenosis in the RCA.  His LIMA-LAD is patent and his vein graft to the OM is patent.  The OM is occluded beyond the insertion of the graft.  He has had some increased frequency of chest pain in the last several mos.  His EKG is unchanged.  I do not think his HR can tolerate increasing his beta-blocker.  Since his BP is above target and his amlodipine is at max dose, I will adjust his nitrates further.   Increase Isosorbide MN to 180 mg once daily Continue Amlodipine 10 mg once daily, ASA 81 mg once daily, Clopidogrel 75 mg once daily, Metoprolol Tartrate 50 mg twice daily, Rosuvastatin 20 mg once daily. F/u w/ me or Dr. Burt Knack in 3 mos Return sooner if symptoms progress  Essential hypertension Blood pressure above goal.  Continue amlodipine 10 mg daily, metoprolol tartrate 50 mg twice daily.  Increase isosorbide mononitrate to 180 mg daily as noted above.  Hyperlipidemia with target LDL less than 70 LDL optimal in March 2022.  Continue rosuvastatin 20 mg daily, icosapent ethyl 2 g twice daily.  Arrange fasting CMET, lipids for next visit.  Chronic heart failure with preserved ejection fraction (HCC) Volume stable.  Continue current Rx.   PAD (peripheral artery disease) ([place]) He has had no claudication and remains pleased with the results of his intervention last year.  Bilateral carotid artery stenosis Carotid duplex in 2/22 with bilateral ICA stenosis 40-59% and left subclavian stenosis.  He denies any symptoms of left subclavian steal.  Follow-up duplex is planned for next month.  CKD (chronic kidney disease), stage III Creatinine has remained stable.   Plan follow-up CMET next visit.      Dispo:  Return in about 3 months (around 04/11/2021) for Routine follow up in 3 months with Dr.Cooper. .      Past Medical History:  Diagnosis Date   Anemia    Blood transfusion    CAD (coronary artery disease)    a. CABG 1993;  b. PCI of SVG to OM Jan 2012 (Drug-eluting stent x 2);  c. 09/2012 Cath/PCI: LM nl, LAD sev dzs, LCX 100, RCA patent stent, VG->AM 100, LIMA->LAD nl, VG->OM 95p ISR (PTCA only), patent distal stent. // d. LHC 3/18: pLAD 100, oRI 100, pLCx 100, Lat OM 95, RCA stent ok, S-PDA 100, L-LAD ok, S-OM1 stent 80 ISR >> PCI: POBA to S-OM1; DES to native Lat OM1   CKD (chronic kidney disease), stage III (HCC)    Degeneration of lumbar or lumbosacral intervertebral disc    GERD (gastroesophageal reflux disease)    H/O hiatal hernia    History of echocardiogram    Echo 3/18: Mild LVH, EF 60-65, Gr 2 DD   History of nephrolithiasis    Hyperlipidemia    Hyperplastic colon polyp    Hypertension    Myocardial infarction (HCC)    hx of five    PAD (peripheral artery disease) ([place])    LE Arterial US 1/22: R CIA > 50% prox and mid and < 50% distal; R ATA mid occlusion   Stricture and stenosis of esophagus    Current Medications: Current Meds  Medication Sig   acetaminophen (TYLENOL) 500 MG tablet Take 500 mg by mouth every 6 (six) hours as needed (pain.).   amLODipine (NORVASC) 10 MG tablet TAKE 1 TABLET (10 MG TOTAL) BY MOUTH EVERY MORNING.   aspirin EC 81 MG tablet Take 1 tablet (81 mg total) by mouth daily. Swallow whole.   clopidogrel (PLAVIX) 75 MG tablet TAKE 1 TABLET BY MOUTH AT BEDTIME   furosemide (LASIX) 20 MG tablet Take 20 mg by mouth daily as needed for fluid.   glimepiride (AMARYL) 2 MG tablet Take 2 mg by mouth in the morning and at bedtime.   icosapent Ethyl (VASCEPA) 1 g capsule Take 2 capsules (2 g total) by mouth 2 (two) times daily.   metFORMIN (GLUCOPHAGE-XR) 500 MG 24 hr tablet Take 1,000 mg by mouth 2 (two) times  daily with a meal.   metoprolol tartrate (LOPRESSOR) 50 MG tablet TAKE 1 TABLET BY MOUTH TWICE A DAY   nitroGLYCERIN (NITROSTAT) 0.4 MG SL tablet Place 1 tablet (0.4 mg total) under the tongue every 5 (five) minutes as needed for chest pain. Up to 3 doses   pantoprazole (PROTONIX) 40 MG tablet TAKE ONE (1) TABLET EACH DAY   rosuvastatin (CRESTOR) 20 MG tablet TAKE 1 TABLET BY MOUTH EVERY DAY   sulfamethoxazole-trimethoprim (BACTRIM DS) 800-160 MG tablet Take 1 tablet by mouth 2 (two) times daily.   [DISCONTINUED] [facility] ASPIRIN ADULT LOW DOSE 81 MG  chewable tablet CHEW 1 TABLET BY MOUTH DAILY.   [DISCONTINUED] isosorbide mononitrate (IMDUR) 120 MG 24 hr tablet TAKE 1 TABLET BY MOUTH EVERY DAY    Allergies:   Patient has no known allergies.   Social History   Tobacco Use   Smoking status: Former    Types: Cigarettes    Quit date: 01/06/1987    Years since quitting: 34.0   Smokeless tobacco: Former  Scientific laboratory technician Use: Never used  Substance Use Topics   Alcohol use: No   Drug use: No    Family Hx: The patient's family history includes CAD (age of onset: 40) in some other family members; Coronary artery disease (age of onset: 25) in his mother; Kidney cancer in his mother; Lung cancer in his brother and sister. There is no history of Colon cancer.  Review of Systems  Constitutional: Negative for chills and fever.  Cardiovascular:  Negative for claudication.  Gastrointestinal:  Negative for hematochezia.  Genitourinary:  Negative for hematuria.    EKGs/Labs/Other Test Reviewed:    EKG:  EKG is   ordered today.  The ekg ordered today demonstrates NSR, HR 60, inferior Q waves, poor wave progression, QTC 392, no change from prior tracing  Recent Labs: 03/29/2020: ALT 13 06/30/2020: Hemoglobin 12.9; Platelets 150 07/06/2020: BUN 23; Creatinine, Ser 1.63; Potassium 4.4; Sodium 140; TSH 3.650   Recent Lipid Panel Lab Results  Component Value Date/Time   CHOL 123 03/29/2020 07:31 AM    TRIG 149 03/29/2020 07:31 AM   HDL 28 (L) 03/29/2020 07:31 AM   LDLCALC 69 03/29/2020 07:31 AM   LDLDIRECT 122.5 12/02/2013 09:22 AM   Risk Assessment/Calculations:          Physical Exam:    VS:  BP 140/60    Pulse 68    Ht 5\' 6"  (1.676 m)    Wt 175 lb 12.8 oz (79.7 kg)    SpO2 97%    BMI 28.37 kg/m     Wt Readings from Last 3 Encounters:  01/11/21 175 lb 12.8 oz (79.7 kg)  10/11/20 173 lb 9.6 oz (78.7 kg)  07/06/20 169 lb (76.7 kg)    Constitutional:      Appearance: Healthy appearance. Not in distress.  Neck:     Vascular: No JVR. JVD normal.  Pulmonary:     Effort: Pulmonary effort is normal.     Breath sounds: No wheezing. No rales.  Cardiovascular:     Normal rate. Regular rhythm. Normal S1. Normal S2.      Murmurs: There is no murmur.  Edema:    Peripheral edema absent.  Abdominal:     Palpations: Abdomen is soft.  Skin:    General: Skin is warm and dry.  Neurological:     General: No focal deficit present.     Mental Status: Alert and oriented to person, place and time.     Cranial Nerves: Cranial nerves are intact.       Medication Adjustments/Labs and Tests Ordered: Current medicines are reviewed at length with the patient today.  Concerns regarding medicines are outlined above.  Tests Ordered: Orders Placed This Encounter  Procedures   EKG 12-Lead   Medication Changes: Meds ordered this encounter  Medications   isosorbide mononitrate (IMDUR) 60 MG 24 hr tablet    Sig: Take 3 tablets (180 mg total) by mouth daily.    Dispense:  270 tablet    Refill:  3   aspirin EC 81 MG  tablet    Sig: Take 1 tablet (81 mg total) by mouth daily. Swallow whole.    Dispense:  90 tablet    Refill:  3    Order Specific Question:   Supervising Provider    Answer:   Lelon Perla [[id number]]   Signed, Richardson Dopp, PA-C  01/11/2021 8:53 AM    [facility] [address], [place], [place]  [address] Phone: [phone]; Fax: [phone]

## 2021-01-11 ENCOUNTER — Encounter: Payer: Self-pay | Admitting: Physician Assistant

## 2021-01-11 ENCOUNTER — Other Ambulatory Visit: Payer: Self-pay

## 2021-01-11 ENCOUNTER — Ambulatory Visit (INDEPENDENT_AMBULATORY_CARE_PROVIDER_SITE_OTHER): Payer: Medicare Other | Admitting: Physician Assistant

## 2021-01-11 VITALS — BP 140/60 | HR 68 | Ht 66.0 in | Wt 175.8 lb

## 2021-01-11 DIAGNOSIS — I10 Essential (primary) hypertension: Secondary | ICD-10-CM

## 2021-01-11 DIAGNOSIS — N1832 Chronic kidney disease, stage 3b: Secondary | ICD-10-CM | POA: Diagnosis not present

## 2021-01-11 DIAGNOSIS — I25119 Atherosclerotic heart disease of native coronary artery with unspecified angina pectoris: Secondary | ICD-10-CM | POA: Diagnosis not present

## 2021-01-11 DIAGNOSIS — I5032 Chronic diastolic (congestive) heart failure: Secondary | ICD-10-CM | POA: Diagnosis not present

## 2021-01-11 DIAGNOSIS — I2510 Atherosclerotic heart disease of native coronary artery without angina pectoris: Secondary | ICD-10-CM

## 2021-01-11 DIAGNOSIS — I739 Peripheral vascular disease, unspecified: Secondary | ICD-10-CM

## 2021-01-11 DIAGNOSIS — I6523 Occlusion and stenosis of bilateral carotid arteries: Secondary | ICD-10-CM | POA: Diagnosis not present

## 2021-01-11 DIAGNOSIS — E785 Hyperlipidemia, unspecified: Secondary | ICD-10-CM

## 2021-01-11 MED ORDER — ISOSORBIDE MONONITRATE ER 60 MG PO TB24: 180.0000 mg | ORAL_TABLET | Freq: Every day | ORAL | 3 refills | Status: DC

## 2021-01-11 MED ORDER — ASPIRIN EC 81 MG PO TBEC: 81.0000 mg | DELAYED_RELEASE_TABLET | Freq: Every day | ORAL | 3 refills | Status: DC

## 2021-01-11 NOTE — Assessment & Plan Note (Signed)
He has had no claudication and remains pleased with the results of his intervention last year.

## 2021-01-11 NOTE — Assessment & Plan Note (Signed)
Creatinine has remained stable.  Plan follow-up CMET next visit.

## 2021-01-11 NOTE — Assessment & Plan Note (Signed)
Blood pressure above goal.  Continue amlodipine 10 mg daily, metoprolol tartrate 50 mg twice daily.  Increase isosorbide mononitrate to 180 mg daily as noted above.

## 2021-01-11 NOTE — Patient Instructions (Signed)
Medication Instructions:   INCREASE Imdur three (3) tablet by mouth ( 180 mg) daily.   *If you need a refill on your cardiac medications before your next appointment, please call your pharmacy*   Lab Work:  -NONE ORDERED  If you have labs (blood work) drawn today and your tests are completely normal, you will receive your results only by: [place] (if you have MyChart) OR A paper copy in the mail If you have any lab test that is abnormal or we need to change your treatment, we will call you to review the results.   Testing/Procedures:  -NONE ORDERED   Follow-Up: At [facility], you and your health needs are our priority.  As part of our continuing mission to provide you with exceptional heart care, we have created designated Provider Care Teams.  These Care Teams include your primary Cardiologist (physician) and Advanced Practice Providers (APPs -  Physician Assistants and Nurse Practitioners) who all work together to provide you with the care you need, when you need it.  We recommend signing up for the patient portal called "MyChart".  Sign up information is provided on this After Visit Summary.  MyChart is used to connect with patients for Virtual Visits (Telemedicine).  Patients are able to view lab/test results, encounter notes, upcoming appointments, etc.  Non-urgent messages can be sent to your provider as well.   To learn more about what you can do with MyChart, go to [email or web address].    Your next appointment:   3 month(s)  The format for your next appointment:   In Person  Provider:   Sherren Mocha, MD, FASTING FROM MIDNIGHT THE NIGHT BEFORE.     Other Instructions  Please follow up with your PCP for your neck pain.

## 2021-01-11 NOTE — Assessment & Plan Note (Signed)
Carotid duplex in 2/22 with bilateral ICA stenosis 40-59% and left subclavian stenosis.  He denies any symptoms of left subclavian steal.  Follow-up duplex is planned for next month.

## 2021-01-11 NOTE — Assessment & Plan Note (Signed)
LDL optimal in March 2022.  Continue rosuvastatin 20 mg daily, icosapent ethyl 2 g twice daily.  Arrange fasting CMET, lipids for next visit.

## 2021-01-11 NOTE — Assessment & Plan Note (Signed)
Volume stable.  Continue current Rx.

## 2021-01-11 NOTE — Assessment & Plan Note (Addendum)
S/p CABG 1993 and multiple PCI procedures since.  His last PCI was in 6/22 and he underwent angioplasty to in-stent restenosis in the RCA.  His LIMA-LAD is patent and his vein graft to the OM is patent.  The OM is occluded beyond the insertion of the graft.  He has had some increased frequency of chest pain in the last several mos.  His EKG is unchanged.  I do not think his HR can tolerate increasing his beta-blocker.  Since his BP is above target and his amlodipine is at max dose, I will adjust his nitrates further.    Increase Isosorbide MN to 180 mg once daily  Continue Amlodipine 10 mg once daily, ASA 81 mg once daily, Clopidogrel 75 mg once daily, Metoprolol Tartrate 50 mg twice daily, Rosuvastatin 20 mg once daily.  F/u w/ me or Dr. Burt Knack in 3 mos  Return sooner if symptoms progress

## 2021-01-16 DIAGNOSIS — D485 Neoplasm of uncertain behavior of skin: Secondary | ICD-10-CM | POA: Diagnosis not present

## 2021-01-16 DIAGNOSIS — C4442 Squamous cell carcinoma of skin of scalp and neck: Secondary | ICD-10-CM | POA: Diagnosis not present

## 2021-01-19 ENCOUNTER — Other Ambulatory Visit: Payer: Self-pay | Admitting: Physician Assistant

## 2021-02-03 DIAGNOSIS — N200 Calculus of kidney: Secondary | ICD-10-CM | POA: Diagnosis not present

## 2021-02-03 DIAGNOSIS — N401 Enlarged prostate with lower urinary tract symptoms: Secondary | ICD-10-CM | POA: Diagnosis not present

## 2021-02-07 ENCOUNTER — Other Ambulatory Visit: Payer: Self-pay

## 2021-02-07 ENCOUNTER — Ambulatory Visit (HOSPITAL_COMMUNITY)
Admission: RE | Admit: 2021-02-07 | Discharge: 2021-02-07 | Disposition: A | Discharge status: Home / Self Care | Payer: Medicare Other | Source: Ambulatory Visit | Attending: Cardiovascular Disease | Admitting: Cardiovascular Disease

## 2021-02-07 DIAGNOSIS — I6523 Occlusion and stenosis of bilateral carotid arteries: Secondary | ICD-10-CM | POA: Diagnosis not present

## 2021-02-14 ENCOUNTER — Other Ambulatory Visit: Payer: Self-pay | Admitting: *Deleted

## 2021-02-14 DIAGNOSIS — I6523 Occlusion and stenosis of bilateral carotid arteries: Secondary | ICD-10-CM

## 2021-04-19 ENCOUNTER — Encounter: Payer: Self-pay | Admitting: Cardiovascular Disease

## 2021-04-19 ENCOUNTER — Ambulatory Visit (INDEPENDENT_AMBULATORY_CARE_PROVIDER_SITE_OTHER): Payer: Medicare Other | Admitting: Cardiovascular Disease

## 2021-04-19 VITALS — BP 112/76 | HR 60 | Ht 66.0 in | Wt 173.2 lb

## 2021-04-19 DIAGNOSIS — I6523 Occlusion and stenosis of bilateral carotid arteries: Secondary | ICD-10-CM | POA: Diagnosis not present

## 2021-04-19 DIAGNOSIS — E782 Mixed hyperlipidemia: Secondary | ICD-10-CM

## 2021-04-19 DIAGNOSIS — I25119 Atherosclerotic heart disease of native coronary artery with unspecified angina pectoris: Secondary | ICD-10-CM

## 2021-04-19 DIAGNOSIS — N1832 Chronic kidney disease, stage 3b: Secondary | ICD-10-CM

## 2021-04-19 DIAGNOSIS — I739 Peripheral vascular disease, unspecified: Secondary | ICD-10-CM | POA: Diagnosis not present

## 2021-04-19 LAB — CBC
Hematocrit: 40 % (ref 37.5–51.0)
Hemoglobin: 13.8 g/dL (ref 13.0–17.7)
MCH: 29.9 pg (ref 26.6–33.0)
MCHC: 34.5 g/dL (ref 31.5–35.7)
MCV: 87 fL (ref 79–97)
Platelets: 202 10*3/uL (ref 150–450)
RBC: 4.62 x10E6/uL (ref 4.14–5.80)
RDW: 12.6 % (ref 11.6–15.4)
WBC: 7.2 10*3/uL (ref 3.4–10.8)

## 2021-04-19 LAB — COMPREHENSIVE METABOLIC PANEL
ALT: 14 IU/L (ref 0–44)
AST: 13 IU/L (ref 0–40)
Albumin/Globulin Ratio: 1.4 (ref 1.2–2.2)
Albumin: 4.3 g/dL (ref 3.7–4.7)
Alkaline Phosphatase: 89 IU/L (ref 44–121)
BUN/Creatinine Ratio: 13 (ref 10–24)
BUN: 24 mg/dL (ref 8–27)
Bilirubin Total: 0.3 mg/dL (ref 0.0–1.2)
CO2: 23 mmol/L (ref 20–29)
Calcium: 9.8 mg/dL (ref 8.6–10.2)
Chloride: 98 mmol/L (ref 96–106)
Creatinine, Ser: 1.89 mg/dL — ABNORMAL HIGH (ref 0.76–1.27)
Globulin, Total: 3 g/dL (ref 1.5–4.5)
Glucose: 303 mg/dL — ABNORMAL HIGH (ref 70–99)
Potassium: 5.1 mmol/L (ref 3.5–5.2)
Sodium: 133 mmol/L — ABNORMAL LOW (ref 134–144)
Total Protein: 7.3 g/dL (ref 6.0–8.5)
eGFR: 37 mL/min/{1.73_m2} — ABNORMAL LOW (ref >59)

## 2021-04-19 LAB — LIPID PANEL
Chol/HDL Ratio: 4.5 ratio (ref 0.0–5.0)
Cholesterol, Total: 135 mg/dL (ref 100–199)
HDL: 30 mg/dL — ABNORMAL LOW (ref >39)
LDL Chol Calc (NIH): 72 mg/dL (ref 0–99)
Triglycerides: 194 mg/dL — ABNORMAL HIGH (ref 0–149)
VLDL Cholesterol Cal: 33 mg/dL (ref 5–40)

## 2021-04-19 NOTE — Patient Instructions (Signed)
Medication Instructions:  ?Your physician recommends that you continue on your current medications as directed. Please refer to the Current Medication list given to you today. ? ?*If you need a refill on your cardiac medications before your next appointment, please call your pharmacy* ? ? ?Lab Work: ?CMET, CBC, Lipids Today ?If you have labs (blood work) drawn today and your tests are completely normal, you will receive your results only by: ?MyChart Message (if you have MyChart) OR ?A paper copy in the mail ?If you have any lab test that is abnormal or we need to change your treatment, we will call you to review the results. ? ? ?Testing/Procedures: ?Carotid Duplex Ultrasound (prior to next appt) ?Your physician has requested that you have a carotid duplex. This test is an ultrasound of the carotid arteries in your neck. It looks at blood flow through these arteries that supply the brain with blood. Allow one hour for this exam. There are no restrictions or special instructions. ? ?Follow-Up: ?At [facility], you and your health needs are our priority.  As part of our continuing mission to provide you with exceptional heart care, we have created designated Provider Care Teams.  These Care Teams include your primary Cardiologist (physician) and Advanced Practice Providers (APPs -  Physician Assistants and Nurse Practitioners) who all work together to provide you with the care you need, when you need it. ? ?We recommend signing up for the patient portal called "MyChart".  Sign up information is provided on this After Visit Summary.  MyChart is used to connect with patients for Virtual Visits (Telemedicine).  Patients are able to view lab/test results, encounter notes, upcoming appointments, etc.  Non-urgent messages can be sent to your provider as well.   ?To learn more about what you can do with MyChart, go to [email or web address].   ? ?Your next appointment:   ?1 year(s) ? ?The format for your next  appointment:   ?In Person ? ?Provider:   ?Sherren Mocha, MD   ? ?  ?Important Information About Sugar ? ? ? ? ?  ?

## 2021-04-19 NOTE — Progress Notes (Signed)
?Cardiology Office Note:   ? ?Date:  04/19/2021  ? ?ID:  [name], DOB [birth year], MRN [id number] ? ?PCP:  Allean Found, MD ?  ?[facility] Providers ?Cardiologist:  Sherren Mocha, MD ?Cardiology APP:  Liliane Shi, PA-C    ? ?Referring MD: Allean Found, MD  ? ?Chief Complaint  ?Patient presents with  ? Coronary Artery Disease  ? ? ?History of Present Illness:   ? ?[name] is a 76 y.o. male with a hx of: ?Coronary artery disease w/ angina ?S/p CABG in 1993 ?S/p PCI to S-OM in 2009 and again in 2012, 2014, 2018 ?S/p POBA to S-OM1 and DES to OM in 2018 ?S/p POBA to RCA prox and mid ISR 6/22 ?Heart failure with preserved ejection fraction  ?Carotid artery disease ?[place] 2/22: Bilat ICA 40-59; L subclavian stenosis  ?Peripheral arterial disease ?S/p bilateral CIA stents ext into distal Aorta in 02/2020 ?Chronic kidney disease ?Hypertension ?Hyperlipidemia  ? ?The patient is here with his wife today.  He has started having some trouble with his memory.  He continues to operate an automobile and reports that he has not had any trouble with getting lost or forgetting where he is going.  He has become more forgetful and has discussed this with his primary care physician.  They have some questions about whether he might be a candidate for medications for his memory.  He has had a few episodes of fleeting chest discomfort since his last visit, but reports no regular angina with exertion or physical activity.  Overall he feels that he is doing fairly well from a cardiac perspective.  He is more fatigued than he was in the past and he rests more frequently.  He denies shortness of breath or lightheadedness with physical activity.  No edema, orthopnea, or PND.  He is less active than he has been in the past. ? ?Past Medical History:  ?Diagnosis Date  ? Anemia   ? Blood transfusion   ? CAD (coronary artery disease)   ? a. CABG 1993;  b. PCI of SVG to OM Jan 2012 (Drug-eluting stent x 2);  c. 09/2012 Cath/PCI: LM nl,  LAD sev dzs, LCX 100, RCA patent stent, VG->AM 100, LIMA->LAD nl, VG->OM 95p ISR (PTCA only), patent distal stent. // d. LHC 3/18: pLAD 100, oRI 100, pLCx 100, Lat OM 95, RCA stent ok, S-PDA 100, L-LAD ok, S-OM1 stent 80 ISR >> PCI: POBA to S-OM1; DES to native Lat OM1  ? CKD (chronic kidney disease), stage III ([place])   ? Degeneration of lumbar or lumbosacral intervertebral disc   ? GERD (gastroesophageal reflux disease)   ? H/O hiatal hernia   ? History of echocardiogram   ? Echo 3/18: Mild LVH, EF 60-65, Gr 2 DD  ? History of nephrolithiasis   ? Hyperlipidemia   ? Hyperplastic colon polyp   ? Hypertension   ? Myocardial infarction [facility])   ? hx of five   ? PAD (peripheral artery disease) ([place])   ? LE Arterial US 1/22: R CIA > 50% prox and mid and < 50% distal; R ATA mid occlusion  ? Stricture and stenosis of esophagus   ? ? ?Past Surgical History:  ?Procedure Laterality Date  ? ABDOMINAL AORTOGRAM W/LOWER EXTREMITY N/A 02/17/2020  ? Procedure: ABDOMINAL AORTOGRAM W/LOWER EXTREMITY;  Surgeon: Wellington Hampshire, MD;  Location: [place] [facility];  Service: Cardiovascular;  Laterality: N/A;  ? ANGIOPLASTY    ? BACK SURGERY    ?  CARDIAC CATHETERIZATION    ? COLONOSCOPY    ? CORONARY ARTERY BYPASS GRAFT  1993  ? CORONARY BALLOON ANGIOPLASTY N/A 06/29/2020  ? Procedure: CORONARY BALLOON ANGIOPLASTY;  Surgeon: Leonie Man, MD;  Location: [place] [facility];  Service: Cardiovascular;  Laterality: N/A;  ? CORONARY STENT INTERVENTION N/A 03/19/2016  ? Procedure: Coronary Stent Intervention;  Surgeon: Sherren Mocha, MD;  Location: [place] [facility];  Service: Cardiovascular;  Laterality: N/A;  ? HOLMIUM LASER APPLICATION Right 05/03/7739  ? Procedure: HOLMIUM LASER APPLICATION;  Surgeon: Jorja Loa, MD;  Location: [facility];  Service: Urology;  Laterality: Right;  ? LEFT HEART CATH AND CORS/GRAFTS ANGIOGRAPHY N/A 03/19/2016  ? Procedure: Left Heart Cath and Cors/Grafts Angiography;  Surgeon: Sherren Mocha, MD;   Location: [place] [facility];  Service: Cardiovascular;  Laterality: N/A;  ? LEFT HEART CATH AND CORS/GRAFTS ANGIOGRAPHY N/A 06/29/2020  ? Procedure: LEFT HEART CATH AND CORS/GRAFTS ANGIOGRAPHY;  Surgeon: Leonie Man, MD;  Location: [place] [facility];  Service: Cardiovascular;  Laterality: N/A;  ? LEFT HEART CATHETERIZATION WITH CORONARY/GRAFT ANGIOGRAM N/A 09/23/2012  ? Procedure: LEFT HEART CATHETERIZATION WITH Beatrix Fetters;  Surgeon: Peter M Martinique, MD;  Location: [facility];  Service: Cardiovascular;  Laterality: N/A;  ? LITHOTRIPSY    ? Lumbar back    ? NEPHROLITHOTOMY Right 09/17/2013  ? Procedure: NEPHROLITHOTOMY PERCUTANEOUS;  Surgeon: Jorja Loa, MD;  Location: [facility];  Service: Urology;  Laterality: Right;  ? PERCUTANEOUS CORONARY INTERVENTION-BALLOON ONLY  09/23/2012  ? Procedure: PERCUTANEOUS CORONARY INTERVENTION-BALLOON ONLY;  Surgeon: Peter M Martinique, MD;  Location[facility];  Service: Cardiovascular;;  OM vein graft  ? PERIPHERAL VASCULAR INTERVENTION Bilateral 02/17/2020  ? Procedure: PERIPHERAL VASCULAR INTERVENTION;  Surgeon: Wellington Hampshire, MD;  Location: [place] [facility];  Service: Cardiovascular;  Laterality: Bilateral;  Iliac stents  ? ? ?Current Medications: ?Current Meds  ?Medication Sig  ? acetaminophen (TYLENOL) 500 MG tablet Take 500 mg by mouth every 6 (six) hours as needed (pain.).  ? amLODipine (NORVASC) 10 MG tablet TAKE 1 TABLET (10 MG TOTAL) BY MOUTH EVERY MORNING.  ? aspirin EC 81 MG tablet Take 1 tablet (81 mg total) by mouth daily. Swallow whole.  ? clopidogrel (PLAVIX) 75 MG tablet TAKE 1 TABLET BY MOUTH AT BEDTIME  ? furosemide (LASIX) 20 MG tablet Take 20 mg by mouth daily as needed for fluid.  ? glipiZIDE (GLUCOTROL XL) 10 MG 24 hr tablet Take 10 mg by mouth 2 (two) times daily.  ? icosapent Ethyl (VASCEPA) 1 g capsule Take 2 capsules (2 g total) by mouth 2 (two) times daily.  ? isosorbide mononitrate (IMDUR) 60 MG 24 hr tablet TAKE 3 TABLETS  (180 MG TOTAL) BY MOUTH DAILY.  ? metFORMIN (GLUCOPHAGE-XR) 500 MG 24 hr tablet Take 1,000 mg by mouth 2 (two) times daily with a meal.  ? metoprolol tartrate (LOPRESSOR) 50 MG tablet TAKE 1 TABLET BY MOUTH TWICE A DAY  ? nitroGLYCERIN (NITROSTAT) 0.4 MG SL tablet Place 1 tablet (0.4 mg total) under the tongue every 5 (five) minutes as needed for chest pain. Up to 3 doses  ? pantoprazole (PROTONIX) 40 MG tablet TAKE ONE (1) TABLET EACH DAY  ? rosuvastatin (CRESTOR) 20 MG tablet TAKE 1 TABLET BY MOUTH EVERY DAY  ?  ? ?Allergies:   Patient has no known allergies.  ? ?Social History  ? ?Socioeconomic History  ? Marital status: Married  ?  Spouse name: Joycelyn Schmid  Mcbee  ? Number of children: 2  ? Years of education: Not on file  ? Highest education level: Not on file  ?Occupational History  ? Occupation: Retired  ?  Employer: DISABLED  ?  Comment: Chemical engineer  ?Tobacco Use  ? Smoking status: Former  ?  Types: Cigarettes  ?  Quit date: 01/06/1987  ?  Years since quitting: 34.3  ? Smokeless tobacco: Former  ?Vaping Use  ? Vaping Use: Never used  ?Substance and Sexual Activity  ? Alcohol use: No  ? Drug use: No  ? Sexual activity: Not on file  ?Other Topics Concern  ? Not on file  ?Social History Narrative  ? Not on file  ? ?Social Determinants of Health  ? ?Financial Resource Strain: Not on file  ?Food Insecurity: Not on file  ?Transportation Needs: Not on file  ?Physical Activity: Not on file  ?Stress: Not on file  ?Social Connections: Not on file  ?  ? ?Family History: ?The patient's family history includes CAD (age of onset: 47) in some other family members; Coronary artery disease (age of onset: 25) in his mother; Kidney cancer in his mother; Lung cancer in his brother and sister. There is no history of Colon cancer. ? ?ROS:   ?Please see the history of present illness.    ?Positive for fatigue and memory loss.  All other systems reviewed and are negative. ? ?EKGs/Labs/Other Studies Reviewed:   ? ?The following  studies were reviewed today: ?Carotid US 02/27/2021: ?Summary:  ?Right Carotid: Velocities in the right ICA are consistent with a 40-59%  ?               stenosis.  ? ?Left Carotid: Velocities in the left ICA are co

## 2021-05-03 ENCOUNTER — Telehealth: Payer: Self-pay

## 2021-05-03 DIAGNOSIS — N1832 Chronic kidney disease, stage 3b: Secondary | ICD-10-CM

## 2021-05-03 NOTE — Telephone Encounter (Signed)
Pt denies use of any NSAIDS. Repeat labs scheduled for 08/04/21 ?

## 2021-05-03 NOTE — Telephone Encounter (Signed)
-----   Message from Sherren Mocha, MD sent at 04/28/2021 12:12 PM EDT ----- ?Labs reviewed. Creatinine up a little from baseline. Please make sure he's staying hydrated and not taking NSAID's. Please repeat BMET in 3 months. Otherwise no changes. thx ?

## 2021-06-02 ENCOUNTER — Other Ambulatory Visit: Payer: Self-pay | Admitting: Physician Assistant

## 2021-06-02 ENCOUNTER — Other Ambulatory Visit: Payer: Self-pay | Admitting: Cardiovascular Disease

## 2021-06-02 DIAGNOSIS — I10 Essential (primary) hypertension: Secondary | ICD-10-CM

## 2021-06-02 DIAGNOSIS — I25110 Atherosclerotic heart disease of native coronary artery with unstable angina pectoris: Secondary | ICD-10-CM

## 2021-06-02 DIAGNOSIS — E785 Hyperlipidemia, unspecified: Secondary | ICD-10-CM

## 2021-06-02 MED ORDER — ISOSORBIDE MONONITRATE ER 60 MG PO TB24: 180.0000 mg | ORAL_TABLET | Freq: Every day | ORAL | 3 refills | Status: DC

## 2021-06-28 ENCOUNTER — Other Ambulatory Visit: Payer: Self-pay | Admitting: Cardiovascular Disease

## 2021-06-28 MED ORDER — ISOSORBIDE MONONITRATE ER 60 MG PO TB24: 180.0000 mg | ORAL_TABLET | Freq: Every day | ORAL | 3 refills | Status: DC

## 2021-06-28 NOTE — Telephone Encounter (Signed)
Refill request

## 2021-07-14 DIAGNOSIS — D839 Common variable immunodeficiency, unspecified: Secondary | ICD-10-CM | POA: Diagnosis not present

## 2021-07-14 DIAGNOSIS — M069 Rheumatoid arthritis, unspecified: Secondary | ICD-10-CM | POA: Diagnosis not present

## 2021-07-14 DIAGNOSIS — G7000 Myasthenia gravis without (acute) exacerbation: Secondary | ICD-10-CM | POA: Diagnosis not present

## 2021-07-14 DIAGNOSIS — K900 Celiac disease: Secondary | ICD-10-CM | POA: Diagnosis not present

## 2021-08-04 ENCOUNTER — Other Ambulatory Visit: Payer: Medicare Other

## 2021-08-04 DIAGNOSIS — N1832 Chronic kidney disease, stage 3b: Secondary | ICD-10-CM | POA: Diagnosis not present

## 2021-08-05 LAB — BASIC METABOLIC PANEL
BUN/Creatinine Ratio: 12 (ref 10–24)
BUN: 19 mg/dL (ref 8–27)
CO2: 21 mmol/L (ref 20–29)
Calcium: 9.5 mg/dL (ref 8.6–10.2)
Chloride: 104 mmol/L (ref 96–106)
Creatinine, Ser: 1.53 mg/dL — ABNORMAL HIGH (ref 0.76–1.27)
Glucose: 145 mg/dL — ABNORMAL HIGH (ref 70–99)
Potassium: 4.2 mmol/L (ref 3.5–5.2)
Sodium: 140 mmol/L (ref 134–144)
eGFR: 47 mL/min/{1.73_m2} — ABNORMAL LOW (ref >59)

## 2021-09-20 ENCOUNTER — Ambulatory Visit (HOSPITAL_BASED_OUTPATIENT_CLINIC_OR_DEPARTMENT_OTHER)
Admission: RE | Admit: 2021-09-20 | Discharge: 2021-09-20 | Disposition: A | Discharge status: Home / Self Care | Payer: Medicare Other | Source: Ambulatory Visit | Attending: Cardiovascular Disease | Admitting: Cardiovascular Disease

## 2021-09-20 ENCOUNTER — Ambulatory Visit (HOSPITAL_COMMUNITY)
Admission: RE | Admit: 2021-09-20 | Discharge: 2021-09-20 | Disposition: A | Discharge status: Home / Self Care | Payer: Medicare Other | Source: Ambulatory Visit | Attending: Cardiovascular Disease | Admitting: Cardiovascular Disease

## 2021-09-20 DIAGNOSIS — Z95828 Presence of other vascular implants and grafts: Secondary | ICD-10-CM | POA: Insufficient documentation

## 2021-09-26 DIAGNOSIS — C259 Malignant neoplasm of pancreas, unspecified: Secondary | ICD-10-CM | POA: Diagnosis not present

## 2021-09-26 DIAGNOSIS — Z808 Family history of malignant neoplasm of other organs or systems: Secondary | ICD-10-CM | POA: Diagnosis not present

## 2021-09-26 DIAGNOSIS — Z1509 Genetic susceptibility to other malignant neoplasm: Secondary | ICD-10-CM | POA: Diagnosis not present

## 2021-09-26 DIAGNOSIS — C168 Malignant neoplasm of overlapping sites of stomach: Secondary | ICD-10-CM | POA: Diagnosis not present

## 2021-09-26 DIAGNOSIS — Z8500 Personal history of malignant neoplasm of unspecified digestive organ: Secondary | ICD-10-CM | POA: Diagnosis not present

## 2021-09-26 DIAGNOSIS — Z8507 Personal history of malignant neoplasm of pancreas: Secondary | ICD-10-CM | POA: Diagnosis not present

## 2021-09-26 DIAGNOSIS — Z800 Family history of malignant neoplasm of digestive organs: Secondary | ICD-10-CM | POA: Diagnosis not present

## 2021-09-26 DIAGNOSIS — Z1379 Encounter for other screening for genetic and chromosomal anomalies: Secondary | ICD-10-CM | POA: Diagnosis not present

## 2021-10-03 ENCOUNTER — Encounter: Payer: Self-pay | Admitting: Cardiovascular Disease

## 2021-10-03 ENCOUNTER — Ambulatory Visit: Payer: Medicare Other | Attending: Cardiovascular Disease | Admitting: Cardiovascular Disease

## 2021-10-03 VITALS — BP 150/70 | HR 52 | Ht 66.0 in | Wt 174.2 lb

## 2021-10-03 DIAGNOSIS — I6523 Occlusion and stenosis of bilateral carotid arteries: Secondary | ICD-10-CM | POA: Diagnosis not present

## 2021-10-03 DIAGNOSIS — I10 Essential (primary) hypertension: Secondary | ICD-10-CM | POA: Diagnosis not present

## 2021-10-03 DIAGNOSIS — I739 Peripheral vascular disease, unspecified: Secondary | ICD-10-CM | POA: Insufficient documentation

## 2021-10-03 DIAGNOSIS — I25118 Atherosclerotic heart disease of native coronary artery with other forms of angina pectoris: Secondary | ICD-10-CM | POA: Diagnosis not present

## 2021-10-03 DIAGNOSIS — E785 Hyperlipidemia, unspecified: Secondary | ICD-10-CM | POA: Diagnosis not present

## 2021-10-03 MED ORDER — CLOPIDOGREL BISULFATE 75 MG PO TABS: ORAL_TABLET | ORAL | 3 refills | Status: DC

## 2021-10-03 NOTE — Progress Notes (Signed)
Cardiology Office Note   Date:  10/03/2021   ID:  [name], DOB 13-Jul-1945, MRN [id number]  PCP:  Allean Found, MD  Cardiologist: Dr. Burt Knack  No chief complaint on file.      History of Present Illness: [name] is a 76 y.o. male who is here today for follow-up visit regarding peripheral arterial disease.   He has known history of coronary artery disease status post CABG in 1993 with multiple subsequent PCI's, chronic diastolic heart failure, chronic kidney disease, essential hypertension and hyperlipidemia.  He was treated for severe bilateral hip and thigh claudication in 2022. Angiography in February of 2022 showed significant distal aortic ulcerated stenosis extending into the ostium of both common iliac arteries with severe stenosis in the distal left common iliac artery.  I performed successful bilateral kissing common iliac artery stent placement extending into the distal aorta with an additional overlap self-expanding stent on the left side.    He had recent lower extremity arterial Doppler studies which showed an ABI of 0.94 on the right and 0.97 on the left.  Duplex showed significant right external iliac artery stenosis with peak velocity of 470.  Common iliac stents were patent with no significant restenosis. He reports some discomfort in the right groin but no significant thigh or calf claudication.  He ran out of clopidogrel and needs refills.  No chest pain.  Past Medical History:  Diagnosis Date   Anemia    Blood transfusion    CAD (coronary artery disease)    a. CABG 1993;  b. PCI of SVG to OM Jan 2012 (Drug-eluting stent x 2);  c. 09/2012 Cath/PCI: LM nl, LAD sev dzs, LCX 100, RCA patent stent, VG->AM 100, LIMA->LAD nl, VG->OM 95p ISR (PTCA only), patent distal stent. // d. LHC 3/18: pLAD 100, oRI 100, pLCx 100, Lat OM 95, RCA stent ok, S-PDA 100, L-LAD ok, S-OM1 stent 80 ISR >> PCI: POBA to S-OM1; DES to native Lat OM1   CKD (chronic kidney disease), stage  III (HCC)    Degeneration of lumbar or lumbosacral intervertebral disc    GERD (gastroesophageal reflux disease)    H/O hiatal hernia    History of echocardiogram    Echo 3/18: Mild LVH, EF 60-65, Gr 2 DD   History of nephrolithiasis    Hyperlipidemia    Hyperplastic colon polyp    Hypertension    Myocardial infarction (HCC)    hx of five    PAD (peripheral artery disease) ([place])    LE Arterial US 1/22: R CIA > 50% prox and mid and < 50% distal; R ATA mid occlusion   Stricture and stenosis of esophagus     Past Surgical History:  Procedure Laterality Date   ABDOMINAL AORTOGRAM W/LOWER EXTREMITY N/A 02/17/2020   Procedure: ABDOMINAL AORTOGRAM W/LOWER EXTREMITY;  Surgeon: Wellington Hampshire, MD;  Location: [facility];  Service: Cardiovascular;  Laterality: N/A;   ANGIOPLASTY     BACK SURGERY     CARDIAC CATHETERIZATION     COLONOSCOPY     CORONARY ARTERY BYPASS GRAFT  1993   CORONARY BALLOON ANGIOPLASTY N/A 06/29/2020   Procedure: CORONARY BALLOON ANGIOPLASTY;  Surgeon: Leonie Man, MD;  Location: [facility];  Service: Cardiovascular;  Laterality: N/A;   CORONARY STENT INTERVENTION N/A 03/19/2016   Procedure: Coronary Stent Intervention;  Surgeon: Sherren Mocha, MD;  Location: [facility];  Service: Cardiovascular;  Laterality: N/A;   HOLMIUM LASER APPLICATION Right  09/17/2013   Procedure: HOLMIUM LASER APPLICATION;  Surgeon: Jorja Loa, MD;  Location: [facility];  Service: Urology;  Laterality: Right;   LEFT HEART CATH AND CORS/GRAFTS ANGIOGRAPHY N/A 03/19/2016   Procedure: Left Heart Cath and Cors/Grafts Angiography;  Surgeon: Sherren Mocha, MD;  Location: [facility];  Service: Cardiovascular;  Laterality: N/A;   LEFT HEART CATH AND CORS/GRAFTS ANGIOGRAPHY N/A 06/29/2020   Procedure: LEFT HEART CATH AND CORS/GRAFTS ANGIOGRAPHY;  Surgeon: Leonie Man, MD;  Location: [facility];  Service: Cardiovascular;  Laterality: N/A;   LEFT HEART  CATHETERIZATION WITH CORONARY/GRAFT ANGIOGRAM N/A 09/23/2012   Procedure: LEFT HEART CATHETERIZATION WITH Beatrix Fetters;  Surgeon: Peter M Martinique, MD;  Location: [facility];  Service: Cardiovascular;  Laterality: N/A;   LITHOTRIPSY     Lumbar back     NEPHROLITHOTOMY Right 09/17/2013   Procedure: NEPHROLITHOTOMY PERCUTANEOUS;  Surgeon: Jorja Loa, MD;  Location: [facility];  Service: Urology;  Laterality: Right;   PERCUTANEOUS CORONARY INTERVENTION-BALLOON ONLY  09/23/2012   Procedure: PERCUTANEOUS CORONARY INTERVENTION-BALLOON ONLY;  Surgeon: Peter M Martinique, MD;  Location: [facility];  Service: Cardiovascular;;  OM vein graft   PERIPHERAL VASCULAR INTERVENTION Bilateral 02/17/2020   Procedure: PERIPHERAL VASCULAR INTERVENTION;  Surgeon: Wellington Hampshire, MD;  Location: [facility];  Service: Cardiovascular;  Laterality: Bilateral;  Iliac stents     Current Outpatient Medications  Medication Sig Dispense Refill   amLODipine (NORVASC) 10 MG tablet TAKE 1 TABLET (10 MG TOTAL) BY MOUTH EVERY MORNING. 90 tablet 1   aspirin EC 81 MG tablet Take 1 tablet (81 mg total) by mouth daily. Swallow whole. 90 tablet 3   furosemide (LASIX) 20 MG tablet Take 20 mg by mouth daily as needed for fluid.     glimepiride (AMARYL) 2 MG tablet Take 2 mg by mouth in the morning and at bedtime.     glipiZIDE (GLUCOTROL XL) 10 MG 24 hr tablet Take 10 mg by mouth 2 (two) times daily.     icosapent Ethyl (VASCEPA) 1 g capsule Take 2 capsules (2 g total) by mouth 2 (two) times daily. 120 capsule 6   isosorbide mononitrate (IMDUR) 60 MG 24 hr tablet Take 3 tablets (180 mg total) by mouth daily. 270 tablet 3   metFORMIN (GLUCOPHAGE-XR) 500 MG 24 hr tablet Take 1,000 mg by mouth 2 (two) times daily with a meal.     metoprolol tartrate (LOPRESSOR) 50 MG tablet TAKE 1 TABLET BY MOUTH TWICE A DAY 180 tablet 3   nitroGLYCERIN (NITROSTAT) 0.4 MG SL tablet Place 1 tablet (0.4 mg total) under the tongue every  5 (five) minutes as needed for chest pain. Up to 3 doses 25 tablet 1   pantoprazole (PROTONIX) 40 MG tablet TAKE ONE (1) TABLET EACH DAY 90 tablet 3   rosuvastatin (CRESTOR) 20 MG tablet TAKE 1 TABLET BY MOUTH EVERY DAY 90 tablet 3   sulfamethoxazole-trimethoprim (BACTRIM DS) 800-160 MG tablet Take 1 tablet by mouth 2 (two) times daily.     clopidogrel (PLAVIX) 75 MG tablet TAKE 1 TABLET BY MOUTH EVERYDAY AT BEDTIME 90 tablet 3   No current facility-administered medications for this visit.    Allergies:   Patient has no known allergies.    Social History:  The patient  reports that he quit smoking about 34 years ago. His smoking use included cigarettes. He has quit using smokeless tobacco. He reports that he does not drink alcohol and does  not use drugs.   Family History:  The patient's family history includes CAD (age of onset: 66) in some other family members; Coronary artery disease (age of onset: 46) in his mother; Kidney cancer in his mother; Lung cancer in his brother and sister.    ROS:  Please see the history of present illness.   Otherwise, review of systems are positive for none.   All other systems are reviewed and negative.    PHYSICAL EXAM: VS:  BP (!) 150/70   Pulse (!) 52   Ht '5\' 6"'$  (1.676 m)   Wt 174 lb 3.2 oz (79 kg)   SpO2 99%   BMI 28.12 kg/m  , BMI Body mass index is 28.12 kg/m. GEN: Well nourished, well developed, in no acute distress  HEENT: normal  Neck: no JVD, or masses.  Right carotid bruit Cardiac: RRR; no murmurs, rubs, or gallops,no edema  Respiratory:  clear to auscultation bilaterally, normal work of breathing GI: soft, nontender, nondistended, + BS MS: no deformity or atrophy  Skin: warm and dry, no rash Neuro:  Strength and sensation are intact Psych: euthymic mood, full affect Vascular: Femoral pulse: +1 bilaterally.  Distal pulses are palpable.   EKG:  EKG is ordered today. EKG showed sinus bradycardia with no significant ST or T wave  changes.     Recent Labs: 04/19/2021: ALT 14; Hemoglobin 13.8; Platelets 202 08/04/2021: BUN 19; Creatinine, Ser 1.53; Potassium 4.2; Sodium 140    Lipid Panel    Component Value Date/Time   CHOL 135 04/19/2021 1131   TRIG 194 (H) 04/19/2021 1131   HDL 30 (L) 04/19/2021 1131   CHOLHDL 4.5 04/19/2021 1131   CHOLHDL 4.4 01/11/2015 0820   VLDL 24 01/11/2015 0820   LDLCALC 72 04/19/2021 1131   LDLDIRECT 122.5 12/02/2013 0922      Wt Readings from Last 3 Encounters:  10/03/21 174 lb 3.2 oz (79 kg)  04/19/21 173 lb 3.2 oz (78.6 kg)  01/11/21 175 lb 12.8 oz (79.7 kg)            No data to display            ASSESSMENT AND PLAN:  1.  Peripheral arterial disease: Status post bilateral common iliac artery stent placement extending into the distal aorta.  Most recent Doppler studies last month showed slightly decreased ABI but no significant restenosis.  There was significant stenosis in the right external iliac artery but his right femoral pulses unchanged from before and currently with no thigh or calf claudication.  Thus, I recommend continuing medical therapy and repeat Doppler studies in September of next year.  2.  Coronary artery disease involving native coronary arteries with chronic angina.  Status post CABG and multiple PCI's.  He ran out of Plavix and this was refilled today.  His symptoms are stable overall.  3.  Essential hypertension: Blood pressure is reasonably controlled on current medications.  4.  Hyperlipidemia: Currently on rosuvastatin and Vascepa.  Most recent lipid profile showed an LDL of 72.  5.  Chronic kidney disease: This has been stable.  6.  Carotid artery disease with right carotid bruit: Most recent carotid Doppler in February showed moderate 40 to 59% stenosis bilaterally .   Repeat study in February 2024.    Disposition:   FU with me in 12 months  Signed,  [name], MD  10/03/2021 1:08 PM    [place]

## 2021-10-03 NOTE — Patient Instructions (Signed)
Medication Instructions:  No changes *If you need a refill on your cardiac medications before your next appointment, please call your pharmacy*   Lab Work: None ordered If you have labs (blood work) drawn today and your tests are completely normal, you will receive your results only by: [place] (if you have MyChart) OR A paper copy in the mail If you have any lab test that is abnormal or we need to change your treatment, we will call you to review the results.   Testing/Procedures: Your physician has requested that you have an Aorta/Iliac Duplex in 09/2022. This will be take place at [address], [address].   No food after 11PM the night before.  Water is OK. (Don't drink liquids if you have been instructed not to for ANOTHER test) Avoid foods that produce bowel gas, for 24 hours prior to exam (see below). No breakfast, no chewing gum, no smoking or carbonated beverages. Patient may take morning medications with water. Come in for test at least 15 minutes early to register.  Your physician has requested that you have an ankle brachial index (ABI) in 09/2022. During this test an ultrasound and blood pressure cuff are used to evaluate the arteries that supply the arms and legs with blood. Allow thirty minutes for this exam. There are no restrictions or special instructions. This will take place at [place], [address].     Follow-Up: At [facility], you and your health needs are our priority.  As part of our continuing mission to provide you with exceptional heart care, we have created designated Provider Care Teams.  These Care Teams include your primary Cardiologist (physician) and Advanced Practice Providers (APPs -  Physician Assistants and Nurse Practitioners) who all work together to provide you with the care you need, when you need it.  We recommend signing up for the patient portal called "MyChart".  Sign up information is provided on this After  Visit Summary.  MyChart is used to connect with patients for Virtual Visits (Telemedicine).  Patients are able to view lab/test results, encounter notes, upcoming appointments, etc.  Non-urgent messages can be sent to your provider as well.   To learn more about what you can do with MyChart, go to [email or web address].    Your next appointment:   12 month(s)  The format for your next appointment:   In Person  Provider:   Dr. Fletcher Anon  Important Information About Sugar

## 2022-02-02 DIAGNOSIS — Z6828 Body mass index (BMI) 28.0-28.9, adult: Secondary | ICD-10-CM | POA: Diagnosis not present

## 2022-02-02 DIAGNOSIS — Z1211 Encounter for screening for malignant neoplasm of colon: Secondary | ICD-10-CM | POA: Diagnosis not present

## 2022-02-02 DIAGNOSIS — Z7984 Long term (current) use of oral hypoglycemic drugs: Secondary | ICD-10-CM | POA: Diagnosis not present

## 2022-02-02 DIAGNOSIS — E663 Overweight: Secondary | ICD-10-CM | POA: Diagnosis not present

## 2022-02-02 DIAGNOSIS — F4321 Adjustment disorder with depressed mood: Secondary | ICD-10-CM | POA: Diagnosis not present

## 2022-02-02 DIAGNOSIS — E1165 Type 2 diabetes mellitus with hyperglycemia: Secondary | ICD-10-CM | POA: Diagnosis not present

## 2022-02-02 DIAGNOSIS — E785 Hyperlipidemia, unspecified: Secondary | ICD-10-CM | POA: Diagnosis not present

## 2022-02-02 DIAGNOSIS — I2510 Atherosclerotic heart disease of native coronary artery without angina pectoris: Secondary | ICD-10-CM | POA: Diagnosis not present

## 2022-02-02 DIAGNOSIS — I10 Essential (primary) hypertension: Secondary | ICD-10-CM | POA: Diagnosis not present

## 2022-02-02 DIAGNOSIS — E782 Mixed hyperlipidemia: Secondary | ICD-10-CM | POA: Diagnosis not present

## 2022-02-05 DIAGNOSIS — N401 Enlarged prostate with lower urinary tract symptoms: Secondary | ICD-10-CM | POA: Diagnosis not present

## 2022-02-05 DIAGNOSIS — N200 Calculus of kidney: Secondary | ICD-10-CM | POA: Diagnosis not present

## 2022-02-08 ENCOUNTER — Ambulatory Visit (HOSPITAL_COMMUNITY)
Admission: RE | Admit: 2022-02-08 | Discharge: 2022-02-08 | Disposition: A | Discharge status: Home / Self Care | Payer: Medicare Other | Source: Ambulatory Visit | Attending: Cardiovascular Disease | Admitting: Cardiovascular Disease

## 2022-02-08 DIAGNOSIS — I6523 Occlusion and stenosis of bilateral carotid arteries: Secondary | ICD-10-CM | POA: Insufficient documentation

## 2022-02-09 ENCOUNTER — Other Ambulatory Visit: Payer: Self-pay | Admitting: *Deleted

## 2022-02-09 DIAGNOSIS — I6523 Occlusion and stenosis of bilateral carotid arteries: Secondary | ICD-10-CM

## 2022-04-30 DIAGNOSIS — Z1339 Encounter for screening examination for other mental health and behavioral disorders: Secondary | ICD-10-CM | POA: Diagnosis not present

## 2022-04-30 DIAGNOSIS — I5032 Chronic diastolic (congestive) heart failure: Secondary | ICD-10-CM | POA: Diagnosis not present

## 2022-04-30 DIAGNOSIS — N1832 Chronic kidney disease, stage 3b: Secondary | ICD-10-CM | POA: Diagnosis not present

## 2022-04-30 DIAGNOSIS — E785 Hyperlipidemia, unspecified: Secondary | ICD-10-CM | POA: Diagnosis not present

## 2022-04-30 DIAGNOSIS — E1165 Type 2 diabetes mellitus with hyperglycemia: Secondary | ICD-10-CM | POA: Diagnosis not present

## 2022-04-30 DIAGNOSIS — I25118 Atherosclerotic heart disease of native coronary artery with other forms of angina pectoris: Secondary | ICD-10-CM | POA: Diagnosis not present

## 2022-04-30 DIAGNOSIS — D696 Thrombocytopenia, unspecified: Secondary | ICD-10-CM | POA: Diagnosis not present

## 2022-04-30 DIAGNOSIS — Z1211 Encounter for screening for malignant neoplasm of colon: Secondary | ICD-10-CM | POA: Diagnosis not present

## 2022-04-30 DIAGNOSIS — I739 Peripheral vascular disease, unspecified: Secondary | ICD-10-CM | POA: Diagnosis not present

## 2022-04-30 DIAGNOSIS — Z1331 Encounter for screening for depression: Secondary | ICD-10-CM | POA: Diagnosis not present

## 2022-04-30 DIAGNOSIS — E538 Deficiency of other specified B group vitamins: Secondary | ICD-10-CM | POA: Diagnosis not present

## 2022-04-30 DIAGNOSIS — Z0000 Encounter for general adult medical examination without abnormal findings: Secondary | ICD-10-CM | POA: Diagnosis not present

## 2022-04-30 DIAGNOSIS — Z125 Encounter for screening for malignant neoplasm of prostate: Secondary | ICD-10-CM | POA: Diagnosis not present

## 2022-05-16 DIAGNOSIS — Z1211 Encounter for screening for malignant neoplasm of colon: Secondary | ICD-10-CM | POA: Diagnosis not present

## 2022-07-18 ENCOUNTER — Other Ambulatory Visit: Payer: Self-pay | Admitting: Cardiovascular Disease

## 2022-08-13 ENCOUNTER — Other Ambulatory Visit: Payer: Self-pay | Admitting: Cardiovascular Disease

## 2022-08-17 ENCOUNTER — Other Ambulatory Visit: Payer: Self-pay | Admitting: Physician Assistant

## 2022-08-20 ENCOUNTER — Other Ambulatory Visit: Payer: Self-pay | Admitting: *Deleted

## 2022-08-20 DIAGNOSIS — E785 Hyperlipidemia, unspecified: Secondary | ICD-10-CM

## 2022-08-20 DIAGNOSIS — I25110 Atherosclerotic heart disease of native coronary artery with unstable angina pectoris: Secondary | ICD-10-CM

## 2022-08-20 DIAGNOSIS — I10 Essential (primary) hypertension: Secondary | ICD-10-CM

## 2022-08-20 MED ORDER — AMLODIPINE BESYLATE 10 MG PO TABS: 10.0000 mg | ORAL_TABLET | Freq: Every morning | ORAL | 0 refills | Status: DC

## 2022-08-29 DIAGNOSIS — I2510 Atherosclerotic heart disease of native coronary artery without angina pectoris: Secondary | ICD-10-CM | POA: Diagnosis not present

## 2022-08-29 DIAGNOSIS — E1122 Type 2 diabetes mellitus with diabetic chronic kidney disease: Secondary | ICD-10-CM | POA: Diagnosis not present

## 2022-08-29 DIAGNOSIS — I739 Peripheral vascular disease, unspecified: Secondary | ICD-10-CM | POA: Diagnosis not present

## 2022-08-29 DIAGNOSIS — E663 Overweight: Secondary | ICD-10-CM | POA: Diagnosis not present

## 2022-08-29 DIAGNOSIS — N1832 Chronic kidney disease, stage 3b: Secondary | ICD-10-CM | POA: Diagnosis not present

## 2022-08-29 DIAGNOSIS — I129 Hypertensive chronic kidney disease with stage 1 through stage 4 chronic kidney disease, or unspecified chronic kidney disease: Secondary | ICD-10-CM | POA: Diagnosis not present

## 2022-08-29 DIAGNOSIS — I25118 Atherosclerotic heart disease of native coronary artery with other forms of angina pectoris: Secondary | ICD-10-CM | POA: Diagnosis not present

## 2022-08-29 DIAGNOSIS — I5032 Chronic diastolic (congestive) heart failure: Secondary | ICD-10-CM | POA: Diagnosis not present

## 2022-08-29 DIAGNOSIS — E785 Hyperlipidemia, unspecified: Secondary | ICD-10-CM | POA: Diagnosis not present

## 2022-08-29 DIAGNOSIS — Z6827 Body mass index (BMI) 27.0-27.9, adult: Secondary | ICD-10-CM | POA: Diagnosis not present

## 2022-09-05 ENCOUNTER — Other Ambulatory Visit: Payer: Self-pay | Admitting: Cardiovascular Disease

## 2022-09-05 DIAGNOSIS — E785 Hyperlipidemia, unspecified: Secondary | ICD-10-CM

## 2022-09-05 DIAGNOSIS — I25110 Atherosclerotic heart disease of native coronary artery with unstable angina pectoris: Secondary | ICD-10-CM

## 2022-09-05 DIAGNOSIS — I10 Essential (primary) hypertension: Secondary | ICD-10-CM

## 2022-09-05 NOTE — Telephone Encounter (Signed)
Refill request

## 2022-09-06 ENCOUNTER — Ambulatory Visit: Payer: Medicare Other | Attending: Cardiovascular Disease | Admitting: Cardiovascular Disease

## 2022-09-06 ENCOUNTER — Encounter: Payer: Self-pay | Admitting: Cardiovascular Disease

## 2022-09-06 VITALS — BP 110/66 | HR 52 | Ht 66.0 in | Wt 167.6 lb

## 2022-09-06 DIAGNOSIS — I739 Peripheral vascular disease, unspecified: Secondary | ICD-10-CM | POA: Insufficient documentation

## 2022-09-06 DIAGNOSIS — I25118 Atherosclerotic heart disease of native coronary artery with other forms of angina pectoris: Secondary | ICD-10-CM | POA: Insufficient documentation

## 2022-09-06 DIAGNOSIS — E782 Mixed hyperlipidemia: Secondary | ICD-10-CM | POA: Insufficient documentation

## 2022-09-06 DIAGNOSIS — N1832 Chronic kidney disease, stage 3b: Secondary | ICD-10-CM | POA: Insufficient documentation

## 2022-09-06 DIAGNOSIS — I6523 Occlusion and stenosis of bilateral carotid arteries: Secondary | ICD-10-CM | POA: Diagnosis present

## 2022-09-06 DIAGNOSIS — I10 Essential (primary) hypertension: Secondary | ICD-10-CM | POA: Diagnosis not present

## 2022-09-06 MED ORDER — ROSUVASTATIN CALCIUM 20 MG PO TABS: 20.0000 mg | ORAL_TABLET | Freq: Every day | ORAL | 3 refills | Status: AC

## 2022-09-06 MED ORDER — AMLODIPINE BESYLATE 10 MG PO TABS: 10.0000 mg | ORAL_TABLET | Freq: Every morning | ORAL | 3 refills | Status: AC

## 2022-09-06 NOTE — Progress Notes (Signed)
Cardiology Office Note:    Date:  09/06/2022   ID:  [name], DOB 1945/12/10, MRN [id number]  PCP:  Donata Duff, MD   [facility] Providers Cardiologist:  Tonny Bollman, MD Cardiology APP:  Kennon Rounds     Referring MD: Donata Duff, MD   Chief Complaint  Patient presents with   Coronary Artery Disease    History of Present Illness:    [name] is a 77 y.o. male with a hx of:  Coronary artery disease w/ angina S/p CABG in 1993 S/p PCI to S-OM in 2009 and again in 2012, 2014, 2018 S/p POBA to S-OM1 and DES to OM in 2018 S/p POBA to RCA prox and mid ISR 6/22 Heart failure with preserved ejection fraction  Carotid artery disease [place] 2/22: Bilat ICA 40-59; L subclavian stenosis  Peripheral arterial disease S/p bilateral CIA stents ext into distal Aorta in 02/2020 Chronic kidney disease Hypertension Hyperlipidemia   When I last saw the patient 1 year ago, he complained of some cognitive issues specifically problems with his memory.  He is here with his wife today and continues to have memory issues.  However, he is able to function well throughout the day.  He has not gotten lost or had problems with driving directions.  States that he is forgetful when it pertains to his short-term memory.  He has had 2 episodes of chest discomfort over the past year requiring sublingual nitroglycerin, none recently.  No shortness of breath, edema, or heart palpitations.  He has some leg discomfort along the back of his right upper leg that occurs at rest and with walking.  No calf claudication symptoms.  Past Medical History:  Diagnosis Date   Anemia    Blood transfusion    CAD (coronary artery disease)    a. CABG 1993;  b. PCI of SVG to OM Jan 2012 (Drug-eluting stent x 2);  c. 09/2012 Cath/PCI: LM nl, LAD sev dzs, LCX 100, RCA patent stent, VG->AM 100, LIMA->LAD nl, VG->OM 95p ISR (PTCA only), patent distal stent. // d. LHC 3/18: pLAD 100, oRI 100, pLCx 100,  Lat OM 95, RCA stent ok, S-PDA 100, L-LAD ok, S-OM1 stent 80 ISR >> PCI: POBA to S-OM1; DES to native Lat OM1   CKD (chronic kidney disease), stage III (HCC)    Degeneration of lumbar or lumbosacral intervertebral disc    GERD (gastroesophageal reflux disease)    H/O hiatal hernia    History of echocardiogram    Echo 3/18: Mild LVH, EF 60-65, Gr 2 DD   History of nephrolithiasis    Hyperlipidemia    Hyperplastic colon polyp    Hypertension    Myocardial infarction (HCC)    hx of five    PAD (peripheral artery disease) (HCC)    LE Arterial US 1/22: R CIA > 50% prox and mid and < 50% distal; R ATA mid occlusion   Stricture and stenosis of esophagus     Past Surgical History:  Procedure Laterality Date   ABDOMINAL AORTOGRAM W/LOWER EXTREMITY N/A 02/17/2020   Procedure: ABDOMINAL AORTOGRAM W/LOWER EXTREMITY;  Surgeon: Iran Ouch, MD;  Location: [facility] INVASIVE CV LAB;  Service: Cardiovascular;  Laterality: N/A;   ANGIOPLASTY     BACK SURGERY     CARDIAC CATHETERIZATION     COLONOSCOPY     CORONARY ARTERY BYPASS GRAFT  1993   CORONARY BALLOON ANGIOPLASTY N/A 06/29/2020   Procedure: CORONARY BALLOON ANGIOPLASTY;  Surgeon: Bryan Lemma  W, MD;  Location: [facility];  Service: Cardiovascular;  Laterality: N/A;   CORONARY STENT INTERVENTION N/A 03/19/2016   Procedure: Coronary Stent Intervention;  Surgeon: Tonny Bollman, MD;  Location: [facility] INVASIVE CV LAB;  Service: Cardiovascular;  Laterality: N/A;   HOLMIUM LASER APPLICATION Right 09/17/2013   Procedure: HOLMIUM LASER APPLICATION;  Surgeon: Chelsea Aus, MD;  Location: [facility] ORS;  Service: Urology;  Laterality: Right;   LEFT HEART CATH AND CORS/GRAFTS ANGIOGRAPHY N/A 03/19/2016   Procedure: Left Heart Cath and Cors/Grafts Angiography;  Surgeon: Tonny Bollman, MD;  Location: [facility] INVASIVE CV LAB;  Service: Cardiovascular;  Laterality: N/A;   LEFT HEART CATH AND CORS/GRAFTS ANGIOGRAPHY N/A 06/29/2020   Procedure: LEFT HEART CATH  AND CORS/GRAFTS ANGIOGRAPHY;  Surgeon: Marykay Lex, MD;  Location: [facility] INVASIVE CV LAB;  Service: Cardiovascular;  Laterality: N/A;   LEFT HEART CATHETERIZATION WITH CORONARY/GRAFT ANGIOGRAM N/A 09/23/2012   Procedure: LEFT HEART CATHETERIZATION WITH Isabel Caprice;  Surgeon: Peter M Swaziland, MD;  Location: [facility] CATH LAB;  Service: Cardiovascular;  Laterality: N/A;   LITHOTRIPSY     Lumbar back     NEPHROLITHOTOMY Right 09/17/2013   Procedure: NEPHROLITHOTOMY PERCUTANEOUS;  Surgeon: Chelsea Aus, MD;  Location: [facility] ORS;  Service: Urology;  Laterality: Right;   PERCUTANEOUS CORONARY INTERVENTION-BALLOON ONLY  09/23/2012   Procedure: PERCUTANEOUS CORONARY INTERVENTION-BALLOON ONLY;  Surgeon: Peter M Swaziland, MD;  Location: [facility] CATH LAB;  Service: Cardiovascular;;  OM vein graft   PERIPHERAL VASCULAR INTERVENTION Bilateral 02/17/2020   Procedure: PERIPHERAL VASCULAR INTERVENTION;  Surgeon: Iran Ouch, MD;  Location: [facility] INVAS[facility] CV LAB;  Service: Cardiovascular;  Laterality: Bilateral;  Iliac stents    Current Medications: Current Meds  Medication Sig   amLODipine (NORVASC) 10 MG tablet Take 1 tablet (10 mg total) by mouth every morning.   clopidogrel (PLAVIX) 75 MG tablet TAKE 1 TABLET BY MOUTH EVERYDAY AT BEDTIME   furosemide (LASIX) 20 MG tablet Take 20 mg by mouth daily as needed for fluid.   glimepiride (AMARYL) 2 MG tablet Take 2 mg by mouth in the morning and at bedtime.   glipiZIDE (GLUCOTROL XL) 10 MG 24 hr tablet Take 10 mg by mouth 2 (two) times daily.   icosapent Ethyl (VASCEPA) 1 g capsule Take 2 capsules (2 g total) by mouth 2 (two) times daily.   metFORMIN (GLUCOPHAGE-XR) 500 MG 24 hr tablet Take 1,000 mg by mouth 2 (two) times daily with a meal.   metoprolol tartrate (LOPRESSOR) 50 MG tablet Take 1 tablet (50 mg total) by mouth 2 (two) times daily.   nitroGLYCERIN (NITROSTAT) 0.4 MG SL tablet Place 1 tablet (0.4 mg total) under the tongue every 5 (five) minutes  as needed for chest pain. Up to 3 doses   pantoprazole (PROTONIX) 40 MG tablet TAKE ONE (1) TABLET EACH DAY   rosuvastatin (CRESTOR) 20 MG tablet Take 1 tablet (20 mg total) by mouth daily.   sulfamethoxazole-trimethoprim (BACTRIM DS) 800-160 MG tablet Take 1 tablet by mouth 2 (two) times daily.   [DISCONTINUED] aspirin EC 81 MG tablet Take 1 tablet (81 mg total) by mouth daily. Swallow whole.     Allergies:   Patient has no known allergies.   Social History   Socioeconomic History   Marital status: Married    Spouse name: Renauld Strebeck   Number of children: 2   Years of education: Not on file   Highest education level: Not on file  Occupational History   Occupation:  Retired    Associate Professor: DISABLED    Comment: Arts administrator  Tobacco Use   Smoking status: Former    Current packs/day: 0.00    Types: Cigarettes    Quit date: 01/06/1987    Years since quitting: 35.6   Smokeless tobacco: Former  Building services engineer status: Never Used  Substance and Sexual Activity   Alcohol use: No   Drug use: No   Sexual activity: Not on file  Other Topics Concern   Not on file  Social History Narrative   Not on file   Social Determinants of Health   Financial Resource Strain: Not on file  Food Insecurity: Low Risk  (04/30/2022)   Received from [facility]   Hunger Vital Sign    Worried About Running Out of Food in the Last Year: Never true    Ran Out of Food in the Last Year: Never true  Transportation Needs: No Transportation Needs (08/29/2022)   Received from [facility]    In the past 12 months, has lack of reliable transportation kept you from medical appointments, meetings, work or from getting things needed for daily living? : No  Physical Activity: Not on file  Stress: Not on file  Social Connections: Not on file     Family History: The patient's family history includes CAD (age of onset: 25) in some other family members; Coronary artery disease (age of  onset: 51) in his mother; Kidney cancer in his mother; Lung cancer in his brother and sister. There is no history of Colon cancer.  ROS:   Please see the history of present illness.    All other systems reviewed and are negative.  EKGs/Labs/Other Studies Reviewed:    The following studies were reviewed today: EKG Interpretation Date/Time:  Thursday September 06 2022 11:03:21 EDT Ventricular Rate:  52 PR Interval:  176 QRS Duration:  92 QT Interval:  444 QTC Calculation: 412 R Axis:   31  Text Interpretation: Sinus bradycardia When compared with ECG of 30-Jun-2020 05:29, Criteria for Septal infarct are no longer Present Nonspecific T wave abnormality no longer evident in Inferior leads T wave inversion no longer evident in Anterior leads Confirmed by Tonny Bollman [phone]) on 09/06/2022 11:14:03 AM    Recent Labs: No results found for requested labs within last 365 days.  Recent Lipid Panel    Component Value Date/Time   CHOL 135 04/19/2021 1131   TRIG 194 (H) 04/19/2021 1131   HDL 30 (L) 04/19/2021 1131   CHOLHDL 4.5 04/19/2021 1131   CHOLHDL 4.4 01/11/2015 0820   VLDL 24 01/11/2015 0820   LDLCALC 72 04/19/2021 1131   LDLDIRECT 122.5 12/02/2013 0922     Risk Assessment/Calculations:                Physical Exam:    VS:  BP 110/66   Pulse (!) 52   Ht 5\' 6"  (1.676 m)   Wt 167 lb 9.6 oz (76 kg)   SpO2 95%   BMI 27.05 kg/m     Wt Readings from Last 3 Encounters:  09/06/22 167 lb 9.6 oz (76 kg)  10/03/21 174 lb 3.2 oz (79 kg)  04/19/21 173 lb 3.2 oz (78.6 kg)     GEN:  Well nourished, well developed in no acute distress HEENT: Normal NECK: No JVD; No carotid bruits LYMPHATICS: No lymphadenopathy CARDIAC: RRR, no murmurs, rubs, gallops RESPIRATORY:  Clear to auscultation without rales, wheezing or rhonchi  ABDOMEN:  Soft, non-tender, non-distended MUSCULOSKELETAL:  No edema; No deformity  SKIN: Warm and dry NEUROLOGIC:  Alert and oriented x 3 PSYCHIATRIC:   Normal affect   ASSESSMENT:    1. Bilateral carotid artery stenosis   2. PAD (peripheral artery disease) (HCC)   3. Coronary artery disease of native artery of native heart with stable angina pectoris (HCC)   4. Stage 3b chronic kidney disease (HCC)   5. Mixed hyperlipidemia   6. Essential hypertension   7. Hyperlipidemia, unspecified hyperlipidemia type    PLAN:    In order of problems listed above:  The patient has 40 to 59% bilateral ICA stenosis.  He appears clinically stable on medical therapy and he will have a follow-up carotid duplex in 1 year for surveillance. Patient with intermittent claudication, followed by Dr. Kirke Corin.  Status post aortoiliac stenting.  Now with some atypical leg pain but no symptoms that were like his previous claudication.  Continue current medical therapy. Stable symptoms of occasional angina.  He has taken 2 nitroglycerin over the last year.  He remains on clopidogrel for antiplatelet therapy, metoprolol, amlodipine, and isosorbide for antianginal therapy, and a high intensity statin drug. Appears clinically stable, recent labs reviewed through care everywhere and his creatinine is 1.7.  I think he would likely benefit from Tarnov.  He is talking to his PCP about this. Treated with a high intensity statin drug.  Last LDL cholesterol was 72. Blood pressure well-controlled on current medical regimen           Medication Adjustments/Labs and Tests Ordered: Current medicines are reviewed at length with the patient today.  Concerns regarding medicines are outlined above.  Orders Placed This Encounter  Procedures   EKG 12-Lead   No orders of the defined types were placed in this encounter.   Patient Instructions  Medication Instructions:  Your physician recommends that you continue on your current medications as directed. Please refer to the Current Medication list given to you today.  *If you need a refill on your cardiac medications before your  next appointment, please call your pharmacy*   Lab Work: NONE If you have labs (blood work) drawn today and your tests are completely normal, you will receive your results only by: MyChart Message (if you have MyChart) OR A paper copy in the mail If you have any lab test that is abnormal or we need to change your treatment, we will call you to review the results.   Testing/Procedures: NONE   Follow-Up: At [facility], you and your health needs are our priority.  As part of our continuing mission to provide you with exceptional heart care, we have created designated Provider Care Teams.  These Care Teams include your primary Cardiologist (physician) and Advanced Practice Providers (APPs -  Physician Assistants and Nurse Practitioners) who all work together to provide you with the care you need, when you need it.  We recommend signing up for the patient portal called "MyChart".  Sign up information is provided on this After Visit Summary.  MyChart is used to connect with patients for Virtual Visits (Telemedicine).  Patients are able to view lab/test results, encounter notes, upcoming appointments, etc.  Non-urgent messages can be sent to your provider as well.   To learn more about what you can do with MyChart, go to [email or web address].    Your next appointment:   1 year(s)  Provider:   Tonny Bollman, MD        Signed, Tonny Bollman,  MD  09/06/2022 11:30 AM    [facility]

## 2022-09-06 NOTE — Patient Instructions (Signed)

## 2022-09-20 ENCOUNTER — Other Ambulatory Visit: Payer: Self-pay | Admitting: Cardiovascular Disease

## 2022-09-20 MED ORDER — ISOSORBIDE MONONITRATE ER 60 MG PO TB24: 180.0000 mg | ORAL_TABLET | Freq: Every day | ORAL | 3 refills | Status: DC

## 2022-09-20 NOTE — Addendum Note (Signed)
Addended by: Margaret Pyle D on: 09/20/2022 04:17 PM   Modules accepted: Orders

## 2022-09-21 ENCOUNTER — Other Ambulatory Visit: Payer: Self-pay | Admitting: Cardiovascular Disease

## 2022-10-03 ENCOUNTER — Ambulatory Visit (HOSPITAL_COMMUNITY)
Admission: RE | Admit: 2022-10-03 | Discharge: 2022-10-03 | Disposition: A | Discharge status: Home / Self Care | Payer: Medicare Other | Source: Ambulatory Visit | Attending: Cardiology | Admitting: Cardiology

## 2022-10-03 ENCOUNTER — Ambulatory Visit (HOSPITAL_BASED_OUTPATIENT_CLINIC_OR_DEPARTMENT_OTHER)
Admission: RE | Admit: 2022-10-03 | Discharge: 2022-10-03 | Disposition: A | Discharge status: Home / Self Care | Payer: Medicare Other | Source: Ambulatory Visit | Attending: Cardiovascular Disease | Admitting: Cardiovascular Disease

## 2022-10-03 DIAGNOSIS — Z95828 Presence of other vascular implants and grafts: Secondary | ICD-10-CM | POA: Diagnosis not present

## 2022-10-03 DIAGNOSIS — I739 Peripheral vascular disease, unspecified: Secondary | ICD-10-CM

## 2022-10-05 LAB — VAS US ABI WITH/WO TBI
Left ABI: 0.99
Right ABI: 0.94

## 2022-10-12 ENCOUNTER — Other Ambulatory Visit (HOSPITAL_COMMUNITY): Payer: Self-pay | Admitting: *Deleted

## 2022-10-12 DIAGNOSIS — I739 Peripheral vascular disease, unspecified: Secondary | ICD-10-CM

## 2022-10-16 ENCOUNTER — Ambulatory Visit: Payer: Medicare Other | Admitting: Cardiovascular Disease

## 2022-10-23 ENCOUNTER — Encounter: Payer: Self-pay | Admitting: Cardiovascular Disease

## 2022-10-23 ENCOUNTER — Ambulatory Visit: Payer: Medicare Other | Attending: Cardiovascular Disease | Admitting: Cardiovascular Disease

## 2022-10-23 VITALS — BP 110/68 | HR 56 | Ht 60.0 in | Wt 164.8 lb

## 2022-10-23 DIAGNOSIS — I25118 Atherosclerotic heart disease of native coronary artery with other forms of angina pectoris: Secondary | ICD-10-CM

## 2022-10-23 DIAGNOSIS — I6523 Occlusion and stenosis of bilateral carotid arteries: Secondary | ICD-10-CM

## 2022-10-23 DIAGNOSIS — E785 Hyperlipidemia, unspecified: Secondary | ICD-10-CM

## 2022-10-23 DIAGNOSIS — I739 Peripheral vascular disease, unspecified: Secondary | ICD-10-CM | POA: Diagnosis not present

## 2022-10-23 DIAGNOSIS — I10 Essential (primary) hypertension: Secondary | ICD-10-CM | POA: Diagnosis not present

## 2022-10-23 DIAGNOSIS — N1832 Chronic kidney disease, stage 3b: Secondary | ICD-10-CM | POA: Diagnosis not present

## 2022-10-23 NOTE — Patient Instructions (Signed)
Medication Instructions:  No changes *If you need a refill on your cardiac medications before your next appointment, please call your pharmacy*   Lab Work: None ordered If you have labs (blood work) drawn today and your tests are completely normal, you will receive your results only by: MyChart Message (if you have MyChart) OR A paper copy in the mail If you have any lab test that is abnormal or we need to change your treatment, we will call you to review the results.   Testing/Procedures: None ordered   Follow-Up: At  [facility]Care, you and your health needs are our priority.  As part of our continuing mission to provide you with exceptional heart care, we have created designated Provider Care Teams.  These Care Teams include your primary Cardiologist (physician) and Advanced Practice Providers (APPs -  Physician Assistants and Nurse Practitioners) who all work together to provide you with the care you need, when you need it.  We recommend signing up for the patient portal called "MyChart".  Sign up information is provided on this After Visit Summary.  MyChart is used to connect with patients for Virtual Visits (Telemedicine).  Patients are able to view lab/test results, encounter notes, upcoming appointments, etc.  Non-urgent messages can be sent to your provider as well.   To learn more about what you can do with MyChart, go to [email or web address].    Your next appointment:   12 month(s)  Provider:   Dr. Arida 

## 2022-10-23 NOTE — Progress Notes (Signed)
Cardiology Office Note   Date:  10/23/2022   ID:  [name], DOB 10/08/1945, MRN [id number]  PCP:  Donata Duff, MD  Cardiologist: Dr. Excell Seltzer  No chief complaint on file.      History of Present Illness: [name] is a 77 y.o. male who is here today for follow-up visit regarding peripheral arterial disease.   He has known history of coronary artery disease status post CABG in 1993 with multiple subsequent PCI's, chronic diastolic heart failure, chronic kidney disease, essential hypertension and hyperlipidemia.  He was treated for severe bilateral hip and thigh claudication in 2022. Angiography in February of 2022 showed significant distal aortic ulcerated stenosis extending into the ostium of both common iliac arteries with severe stenosis in the distal left common iliac artery.  I performed successful bilateral kissing common iliac artery stent placement extending into the distal aorta with an additional overlap self-expanding stent on the left side.   He had recent Doppler done which showed low normal ABI with patent iliac stents.  He has been doing well overall with minimal chest pain.  He reports right thigh discomfort when he is driving the car but not with walking.  Past Medical History:  Diagnosis Date   Anemia    Blood transfusion    CAD (coronary artery disease)    a. CABG 1993;  b. PCI of SVG to OM Jan 2012 (Drug-eluting stent x 2);  c. 09/2012 Cath/PCI: LM nl, LAD sev dzs, LCX 100, RCA patent stent, VG->AM 100, LIMA->LAD nl, VG->OM 95p ISR (PTCA only), patent distal stent. // d. LHC 3/18: pLAD 100, oRI 100, pLCx 100, Lat OM 95, RCA stent ok, S-PDA 100, L-LAD ok, S-OM1 stent 80 ISR >> PCI: POBA to S-OM1; DES to native Lat OM1   CKD (chronic kidney disease), stage III (HCC)    Degeneration of lumbar or lumbosacral intervertebral disc    GERD (gastroesophageal reflux disease)    H/O hiatal hernia    History of echocardiogram    Echo 3/18: Mild LVH, EF 60-65, Gr 2  DD   History of nephrolithiasis    Hyperlipidemia    Hyperplastic colon polyp    Hypertension    Myocardial infarction (HCC)    hx of five    PAD (peripheral artery disease) (HCC)    LE Arterial US 1/22: R CIA > 50% prox and mid and < 50% distal; R ATA mid occlusion   Stricture and stenosis of esophagus     Past Surgical History:  Procedure Laterality Date   ABDOMINAL AORTOGRAM W/LOWER EXTREMITY N/A 02/17/2020   Procedure: ABDOMINAL AORTOGRAM W/LOWER EXTREMITY;  Surgeon: Iran Ouch, MD;  Location: [facility] INVASIVE CV LAB;  Service: Cardiovascular;  Laterality: N/A;   ANGIOPLASTY     BACK SURGERY     CARDIAC CATHETERIZATION     COLONOSCOPY     CORONARY ARTERY BYPASS GRAFT  1993   CORONARY BALLOON ANGIOPLASTY N/A 06/29/2020   Procedure: CORONARY BALLOON ANGIOPLASTY;  Surgeon: Marykay Lex, MD;  Location: [facility] INVASIVE CV LAB;  Service: Cardiovascular;  Laterality: N/A;   CORONARY STENT INTERVENTION N/A 03/19/2016   Procedure: Coronary Stent Intervention;  Surgeon: Tonny Bollman, MD;  Location: [facility] INVASIVE CV LAB;  Service: Cardiovascular;  Laterality: N/A;   HOLMIUM LASER APPLICATION Right 09/17/2013   Procedure: HOLMIUM LASER APPLICATION;  Surgeon: Chelsea Aus, MD;  Location: [facility] ORS;  Service: Urology;  Laterality: Right;   LEFT HEART CATH AND CORS/GRAFTS ANGIOGRAPHY N/A 03/19/2016  Procedure: Left Heart Cath and Cors/Grafts Angiography;  Surgeon: Tonny Bollman, MD;  Location: [facility] INVASIVE CV LAB;  Service: Cardiovascular;  Laterality: N/A;   LEFT HEART CATH AND CORS/GRAFTS ANGIOGRAPHY N/A 06/29/2020   Procedure: LEFT HEART CATH AND CORS/GRAFTS ANGIOGRAPHY;  Surgeon: Marykay Lex, MD;  Location: [facility] INVASIVE CV LAB;  Service: Cardiovascular;  Laterality: N/A;   LEFT HEART CATHETERIZATION WITH CORONARY/GRAFT ANGIOGRAM N/A 09/23/2012   Procedure: LEFT HEART CATHETERIZATION WITH Isabel Caprice;  Surgeon: Peter M Swaziland, MD;  Location: [facility] CATH LAB;  Service:  Cardiovascular;  Laterality: N/A;   LITHOTRIPSY     Lumbar back     NEPHROLITHOTOMY Right 09/17/2013   Procedure: NEPHROLITHOTOMY PERCUTANEOUS;  Surgeon: Chelsea Aus, MD;  Location: [facility] ORS;  Service: Urology;  Laterality: Right;   PERCUTANEOUS CORONARY INTERVENTION-BALLOON ONLY  09/23/2012   Procedure: PERCUTANEOUS CORONARY INTERVENTION-BALLOON ONLY;  Surgeon: Peter M Swaziland, MD;  Location: [facility] CATH LAB;  Service: Cardiovascular;;  OM vein graft   PERIPHERAL VASCULAR INTERVENTION Bilateral 02/17/2020   Procedure: PERIPHERAL VASCULAR INTERVENTION;  Surgeon: Iran Ouch, MD;  Location: [facility] INVASIVE CV LAB;  Service: Cardiovascular;  Laterality: Bilateral;  Iliac stents     Current Outpatient Medications  Medication Sig Dispense Refill   amLODipine (NORVASC) 10 MG tablet Take 1 tablet (10 mg total) by mouth every morning. 90 tablet 3   clopidogrel (PLAVIX) 75 MG tablet TAKE 1 TABLET BY MOUTH EVERYDAY AT BEDTIME 90 tablet 3   furosemide (LASIX) 20 MG tablet Take 20 mg by mouth daily as needed for fluid.     glimepiride (AMARYL) 2 MG tablet Take 2 mg by mouth in the morning and at bedtime.     glipiZIDE (GLUCOTROL XL) 10 MG 24 hr tablet Take 10 mg by mouth 2 (two) times daily.     icosapent Ethyl (VASCEPA) 1 g capsule Take 2 capsules (2 g total) by mouth 2 (two) times daily. 120 capsule 6   isosorbide mononitrate (IMDUR) 60 MG 24 hr tablet Take 3 tablets (180 mg total) by mouth daily. 270 tablet 3   metFORMIN (GLUCOPHAGE-XR) 500 MG 24 hr tablet Take 1,000 mg by mouth 2 (two) times daily with a meal.     metoprolol tartrate (LOPRESSOR) 50 MG tablet Take 1 tablet (50 mg total) by mouth 2 (two) times daily. 180 tablet 1   nitroGLYCERIN (NITROSTAT) 0.4 MG SL tablet Place 1 tablet (0.4 mg total) under the tongue every 5 (five) minutes as needed for chest pain. Up to 3 doses 25 tablet 1   pantoprazole (PROTONIX) 40 MG tablet TAKE 1 TABLET BY MOUTH EVERY DAY 90 tablet 3   rosuvastatin (CRESTOR)  20 MG tablet Take 1 tablet (20 mg total) by mouth daily. 90 tablet 3   sulfamethoxazole-trimethoprim (BACTRIM DS) 800-160 MG tablet Take 1 tablet by mouth 2 (two) times daily.     No current facility-administered medications for this visit.    Allergies:   Patient has no known allergies.    Social History:  The patient  reports that he quit smoking about 35 years ago. His smoking use included cigarettes. He has quit using smokeless tobacco. He reports that he does not drink alcohol and does not use drugs.   Family History:  The patient's family history includes CAD (age of onset: 29) in some other family members; Coronary artery disease (age of onset: 70) in his mother; Kidney cancer in his mother; Lung cancer in his brother and sister.    ROS:  Please see the history of present illness.   Otherwise, review of systems are positive for none.   All other systems are reviewed and negative.    PHYSICAL EXAM: VS:  BP 110/68 (BP Location: Left Arm, Patient Position: Sitting, Cuff Size: Normal)   Pulse (!) 56   Ht 5' (1.524 m)   Wt 164 lb 12.8 oz (74.8 kg)   SpO2 97%   BMI 32.19 kg/m  , BMI Body mass index is 32.19 kg/m. GEN: Well nourished, well developed, in no acute distress  HEENT: normal  Neck: no JVD, or masses.  Right carotid bruit Cardiac: RRR; no murmurs, rubs, or gallops,no edema  Respiratory:  clear to auscultation bilaterally, normal work of breathing GI: soft, nontender, nondistended, + BS MS: no deformity or atrophy  Skin: warm and dry, no rash Neuro:  Strength and sensation are intact Psych: euthymic mood, full affect Vascular: Femoral pulse: +1 bilaterally.  Distal pulses are palpable.   EKG:  EKG is ordered today. EKG showed sinus bradycardia with no significant ST or T wave changes.     Recent Labs: No results found for requested labs within last 365 days.    Lipid Panel    Component Value Date/Time   CHOL 135 04/19/2021 1131   TRIG 194 (H) 04/19/2021  1131   HDL 30 (L) 04/19/2021 1131   CHOLHDL 4.5 04/19/2021 1131   CHOLHDL 4.4 01/11/2015 0820   VLDL 24 01/11/2015 0820   LDLCALC 72 04/19/2021 1131   LDLDIRECT 122.5 12/02/2013 0922      Wt Readings from Last 3 Encounters:  10/23/22 164 lb 12.8 oz (74.8 kg)  09/06/22 167 lb 9.6 oz (76 kg)  10/03/21 174 lb 3.2 oz (79 kg)            No data to display            ASSESSMENT AND PLAN:  1.  Peripheral arterial disease: Status post bilateral common iliac artery stent placement extending into the distal aorta.  Most recent Doppler showed patent stents and low normal ABI.  He has palpable pulses by exam.  Right thigh discomfort while he is driving is not due to peripheral arterial disease and likely due to lumbar spine disease. Repeat Doppler studies in 1 year.  2.  Coronary artery disease involving native coronary arteries with chronic angina.  Status post CABG and multiple PCI's.  He ran out of Plavix and this was refilled today.  His symptoms are stable overall.  3.  Essential hypertension: Blood pressure is reasonably controlled on current medications.  4.  Hyperlipidemia: Currently on rosuvastatin and Vascepa.  Most recent lipid profile showed an LDL of 72.  5.  Chronic kidney disease: This has been stable.  6.  Carotid artery disease with right carotid bruit: He has known moderate bilateral carotid stenosis which has been stable.  Repeat study in a yearly basis.    Disposition:   FU with me in 12 months  Signed,  Lorine Bears, MD  10/23/2022 10:22 AM    [facility]

## 2022-10-24 DIAGNOSIS — R0789 Other chest pain: Secondary | ICD-10-CM | POA: Diagnosis not present

## 2022-10-24 DIAGNOSIS — N179 Acute kidney failure, unspecified: Secondary | ICD-10-CM | POA: Diagnosis present

## 2022-10-24 DIAGNOSIS — Z23 Encounter for immunization: Secondary | ICD-10-CM | POA: Diagnosis not present

## 2022-10-24 DIAGNOSIS — E1151 Type 2 diabetes mellitus with diabetic peripheral angiopathy without gangrene: Secondary | ICD-10-CM | POA: Diagnosis present

## 2022-10-24 DIAGNOSIS — Z7984 Long term (current) use of oral hypoglycemic drugs: Secondary | ICD-10-CM | POA: Diagnosis not present

## 2022-10-24 DIAGNOSIS — R079 Chest pain, unspecified: Secondary | ICD-10-CM | POA: Diagnosis not present

## 2022-10-24 DIAGNOSIS — I25110 Atherosclerotic heart disease of native coronary artery with unstable angina pectoris: Secondary | ICD-10-CM | POA: Diagnosis not present

## 2022-10-24 DIAGNOSIS — Z7902 Long term (current) use of antithrombotics/antiplatelets: Secondary | ICD-10-CM | POA: Diagnosis not present

## 2022-10-24 DIAGNOSIS — K219 Gastro-esophageal reflux disease without esophagitis: Secondary | ICD-10-CM | POA: Diagnosis not present

## 2022-10-24 DIAGNOSIS — I2510 Atherosclerotic heart disease of native coronary artery without angina pectoris: Secondary | ICD-10-CM | POA: Diagnosis present

## 2022-10-24 DIAGNOSIS — R0689 Other abnormalities of breathing: Secondary | ICD-10-CM | POA: Diagnosis not present

## 2022-10-24 DIAGNOSIS — I5032 Chronic diastolic (congestive) heart failure: Secondary | ICD-10-CM | POA: Diagnosis present

## 2022-10-24 DIAGNOSIS — I252 Old myocardial infarction: Secondary | ICD-10-CM | POA: Diagnosis not present

## 2022-10-24 DIAGNOSIS — I10 Essential (primary) hypertension: Secondary | ICD-10-CM | POA: Diagnosis not present

## 2022-10-24 DIAGNOSIS — Z951 Presence of aortocoronary bypass graft: Secondary | ICD-10-CM | POA: Diagnosis not present

## 2022-10-24 DIAGNOSIS — E1122 Type 2 diabetes mellitus with diabetic chronic kidney disease: Secondary | ICD-10-CM | POA: Diagnosis present

## 2022-10-24 DIAGNOSIS — Z79899 Other long term (current) drug therapy: Secondary | ICD-10-CM | POA: Diagnosis not present

## 2022-10-24 DIAGNOSIS — I700 Atherosclerosis of aorta: Secondary | ICD-10-CM | POA: Diagnosis not present

## 2022-10-24 DIAGNOSIS — R000 Tachycardia, unspecified: Secondary | ICD-10-CM | POA: Diagnosis not present

## 2022-10-24 DIAGNOSIS — E119 Type 2 diabetes mellitus without complications: Secondary | ICD-10-CM | POA: Diagnosis not present

## 2022-10-24 DIAGNOSIS — I130 Hypertensive heart and chronic kidney disease with heart failure and stage 1 through stage 4 chronic kidney disease, or unspecified chronic kidney disease: Secondary | ICD-10-CM | POA: Diagnosis present

## 2022-10-24 DIAGNOSIS — N183 Chronic kidney disease, stage 3 unspecified: Secondary | ICD-10-CM | POA: Diagnosis present

## 2022-10-24 DIAGNOSIS — Z955 Presence of coronary angioplasty implant and graft: Secondary | ICD-10-CM | POA: Diagnosis not present

## 2022-10-24 DIAGNOSIS — N189 Chronic kidney disease, unspecified: Secondary | ICD-10-CM | POA: Diagnosis not present

## 2022-10-24 DIAGNOSIS — T82855A Stenosis of coronary artery stent, initial encounter: Secondary | ICD-10-CM | POA: Diagnosis present

## 2022-10-24 DIAGNOSIS — I214 Non-ST elevation (NSTEMI) myocardial infarction: Secondary | ICD-10-CM | POA: Diagnosis present

## 2022-10-24 DIAGNOSIS — R9431 Abnormal electrocardiogram [ECG] [EKG]: Secondary | ICD-10-CM | POA: Diagnosis not present

## 2022-10-24 DIAGNOSIS — I21A9 Other myocardial infarction type: Secondary | ICD-10-CM | POA: Diagnosis present

## 2022-10-25 DIAGNOSIS — E119 Type 2 diabetes mellitus without complications: Secondary | ICD-10-CM | POA: Diagnosis not present

## 2022-10-25 DIAGNOSIS — I10 Essential (primary) hypertension: Secondary | ICD-10-CM | POA: Diagnosis not present

## 2022-10-25 DIAGNOSIS — E1151 Type 2 diabetes mellitus with diabetic peripheral angiopathy without gangrene: Secondary | ICD-10-CM | POA: Diagnosis present

## 2022-10-25 DIAGNOSIS — I130 Hypertensive heart and chronic kidney disease with heart failure and stage 1 through stage 4 chronic kidney disease, or unspecified chronic kidney disease: Secondary | ICD-10-CM | POA: Diagnosis present

## 2022-10-25 DIAGNOSIS — Z7984 Long term (current) use of oral hypoglycemic drugs: Secondary | ICD-10-CM | POA: Diagnosis not present

## 2022-10-25 DIAGNOSIS — T82855A Stenosis of coronary artery stent, initial encounter: Secondary | ICD-10-CM | POA: Diagnosis present

## 2022-10-25 DIAGNOSIS — N179 Acute kidney failure, unspecified: Secondary | ICD-10-CM | POA: Diagnosis present

## 2022-10-25 DIAGNOSIS — K219 Gastro-esophageal reflux disease without esophagitis: Secondary | ICD-10-CM | POA: Diagnosis present

## 2022-10-25 DIAGNOSIS — Z951 Presence of aortocoronary bypass graft: Secondary | ICD-10-CM | POA: Diagnosis not present

## 2022-10-25 DIAGNOSIS — Z79899 Other long term (current) drug therapy: Secondary | ICD-10-CM | POA: Diagnosis not present

## 2022-10-25 DIAGNOSIS — I214 Non-ST elevation (NSTEMI) myocardial infarction: Secondary | ICD-10-CM | POA: Diagnosis present

## 2022-10-25 DIAGNOSIS — I2510 Atherosclerotic heart disease of native coronary artery without angina pectoris: Secondary | ICD-10-CM | POA: Diagnosis present

## 2022-10-25 DIAGNOSIS — Z955 Presence of coronary angioplasty implant and graft: Secondary | ICD-10-CM | POA: Diagnosis not present

## 2022-10-25 DIAGNOSIS — I5032 Chronic diastolic (congestive) heart failure: Secondary | ICD-10-CM | POA: Diagnosis present

## 2022-10-25 DIAGNOSIS — I21A9 Other myocardial infarction type: Secondary | ICD-10-CM | POA: Diagnosis present

## 2022-10-25 DIAGNOSIS — N189 Chronic kidney disease, unspecified: Secondary | ICD-10-CM | POA: Diagnosis not present

## 2022-10-25 DIAGNOSIS — I25110 Atherosclerotic heart disease of native coronary artery with unstable angina pectoris: Secondary | ICD-10-CM | POA: Diagnosis not present

## 2022-10-25 DIAGNOSIS — Z23 Encounter for immunization: Secondary | ICD-10-CM | POA: Diagnosis not present

## 2022-10-25 DIAGNOSIS — N183 Chronic kidney disease, stage 3 unspecified: Secondary | ICD-10-CM | POA: Diagnosis present

## 2022-10-25 DIAGNOSIS — I252 Old myocardial infarction: Secondary | ICD-10-CM | POA: Diagnosis not present

## 2022-10-25 DIAGNOSIS — E1122 Type 2 diabetes mellitus with diabetic chronic kidney disease: Secondary | ICD-10-CM | POA: Diagnosis present

## 2022-10-25 DIAGNOSIS — Z7902 Long term (current) use of antithrombotics/antiplatelets: Secondary | ICD-10-CM | POA: Diagnosis not present

## 2022-11-07 ENCOUNTER — Telehealth: Payer: Self-pay | Admitting: Cardiovascular Disease

## 2022-11-07 DIAGNOSIS — E1122 Type 2 diabetes mellitus with diabetic chronic kidney disease: Secondary | ICD-10-CM | POA: Diagnosis not present

## 2022-11-07 DIAGNOSIS — Z79899 Other long term (current) drug therapy: Secondary | ICD-10-CM | POA: Diagnosis not present

## 2022-11-07 DIAGNOSIS — I5032 Chronic diastolic (congestive) heart failure: Secondary | ICD-10-CM | POA: Diagnosis not present

## 2022-11-07 DIAGNOSIS — Z09 Encounter for follow-up examination after completed treatment for conditions other than malignant neoplasm: Secondary | ICD-10-CM | POA: Diagnosis not present

## 2022-11-07 DIAGNOSIS — I25118 Atherosclerotic heart disease of native coronary artery with other forms of angina pectoris: Secondary | ICD-10-CM | POA: Diagnosis not present

## 2022-11-07 DIAGNOSIS — N1832 Chronic kidney disease, stage 3b: Secondary | ICD-10-CM | POA: Diagnosis not present

## 2022-11-07 NOTE — Telephone Encounter (Signed)
RN Rogers Seeds from [facility] stated the patient was in the hospital recently and would need to have Cardiac Rehab. [facility] Seeds is requesting for [place] to fax a referral to them at [phone]. Please advise.

## 2022-11-08 ENCOUNTER — Ambulatory Visit: Payer: Medicare Other | Attending: Cardiovascular Disease | Admitting: Cardiovascular Disease

## 2022-11-08 ENCOUNTER — Inpatient Hospital Stay
Admission: RE | Admit: 2022-11-08 | Discharge: 2022-11-08 | Disposition: A | Discharge status: Home / Self Care | Payer: Self-pay | Source: Ambulatory Visit | Attending: Cardiovascular Disease | Admitting: Cardiovascular Disease

## 2022-11-08 ENCOUNTER — Encounter: Payer: Self-pay | Admitting: Cardiovascular Disease

## 2022-11-08 VITALS — BP 130/94 | HR 64 | Ht 66.0 in | Wt 166.8 lb

## 2022-11-08 DIAGNOSIS — I25119 Atherosclerotic heart disease of native coronary artery with unspecified angina pectoris: Secondary | ICD-10-CM

## 2022-11-08 DIAGNOSIS — I739 Peripheral vascular disease, unspecified: Secondary | ICD-10-CM | POA: Insufficient documentation

## 2022-11-08 DIAGNOSIS — E782 Mixed hyperlipidemia: Secondary | ICD-10-CM | POA: Insufficient documentation

## 2022-11-08 DIAGNOSIS — N1832 Chronic kidney disease, stage 3b: Secondary | ICD-10-CM | POA: Diagnosis not present

## 2022-11-08 DIAGNOSIS — I10 Essential (primary) hypertension: Secondary | ICD-10-CM | POA: Diagnosis not present

## 2022-11-08 DIAGNOSIS — I6523 Occlusion and stenosis of bilateral carotid arteries: Secondary | ICD-10-CM | POA: Insufficient documentation

## 2022-11-08 NOTE — Assessment & Plan Note (Signed)
Stable with no claudication symptoms at present.  Continue medical therapy

## 2022-11-08 NOTE — Patient Instructions (Signed)
Medication Instructions:  Your physician recommends that you continue on your current medications as directed. Please refer to the Current Medication list given to you today.  *If you need a refill on your cardiac medications before your next appointment, please call your pharmacy*   Lab Work: None ordered  If you have labs (blood work) drawn today and your tests are completely normal, you will receive your results only by: MyChart Message (if you have MyChart) OR A paper copy in the mail If you have any lab test that is abnormal or we need to change your treatment, we will call you to review the results.   Testing/Procedures: None ordered  You have been referred to [employer] at [facility] [place].  They will call you to arrange   Follow-Up: At [facility], you and your health needs are our priority.  As part of our continuing mission to provide you with exceptional heart care, we have created designated Provider Care Teams.  These Care Teams include your primary Cardiologist (physician) and Advanced Practice Providers (APPs -  Physician Assistants and Nurse Practitioners) who all work together to provide you with the care you need, when you need it.  We recommend signing up for the patient portal called "MyChart".  Sign up information is provided on this After Visit Summary.  MyChart is used to connect with patients for Virtual Visits (Telemedicine).  Patients are able to view lab/test results, encounter notes, upcoming appointments, etc.  Non-urgent messages can be sent to your provider as well.   To learn more about what you can do with MyChart, go to [email or web address].    Your next appointment:   3 month(s)  Provider:   Tereso Newcomer, PA-C         Other Instructions

## 2022-11-08 NOTE — Assessment & Plan Note (Signed)
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  I reviewed his cath report which described moderate to severe stenosis in the RCA which has been heavily stented.  Medical therapy was recommended but the patient's wife stated that she was told that PCI could be considered in the future.  I am going to send for his cardiac catheterization films so that these can be reviewed in a decision can be made whether he might need further intervention.  I would like to treat him medically considering his very extensive stenting.  However, if there are any critical or severe lesions and he continues to have angina refractory to medical therapy, PCI would be a reasonable consideration.  He will remain on aspirin and clopidogrel for DAPT, and an antianginal program that includes amlodipine, high-dose isosorbide, and metoprolol tartrate.

## 2022-11-08 NOTE — Assessment & Plan Note (Signed)
Reviewed recent labs.  Creatinine is 2.06.

## 2022-11-08 NOTE — Progress Notes (Signed)
Cardiology Office Note:    Date:  11/08/2022   ID:  [name], DOB 06/12/1945, MRN [id number]  PCP:  Donata Duff, MD   [facility] Providers Cardiologist:  Tonny Bollman, MD Cardiology APP:  Kennon Rounds     Referring MD: Donata Duff, MD   Chief Complaint  Patient presents with   Coronary Artery Disease    History of Present Illness:    [name] is a 77 y.o. male presenting for follow-up of coronary artery disease.  The patient underwent remote CABG in 1993 and is subsequently undergone multiple PCI procedures.  Other comorbid conditions include chronic diastolic heart failure, chronic kidney disease, peripheral arterial disease, and hypertension.  The patient underwent bilateral iliac stenting in 2022 when he was found to have severe ulcerated stenosis of the distal abdominal aorta and proximal iliac arteries.  His cardiovascular problems are outlined below:  Coronary artery disease w/ angina S/p CABG in 1993 S/p PCI to S-OM in 2009 and again in 2012, 2014, 2018 S/p POBA to S-OM1 and DES to OM in 2018 S/p POBA to RCA prox and mid ISR 6/22 Heart failure with preserved ejection fraction  Carotid artery disease [place] 2/22: Bilat ICA 40-59; L subclavian stenosis  Peripheral arterial disease S/p bilateral CIA stents ext into distal Aorta in 02/2020 Chronic kidney disease Hypertension Hyperlipidemia    The patient was recently hospitalized at [facility] [place] with non-STEMI.  He was found to have 99% in-stent restenosis in the saphenous vein graft to obtuse marginal and he underwent PCI with a 3.0 x 20 mm Synergy DES.  He presents today for hospital follow-up.  He had another episode of chest discomfort just a few days ago on the left side of the chest going into the left arm and this lasted about 5 minutes.  He has had no dyspnea, diaphoresis, nausea, or syncope.  His wife notes that his memory has worsened since his hospitalization.  He  otherwise feels like he is doing okay.  Current Medications: Current Meds  Medication Sig   amLODipine (NORVASC) 10 MG tablet Take 1 tablet (10 mg total) by mouth every morning.   aspirin EC 81 MG tablet Take 1 tablet by mouth every morning.   clopidogrel (PLAVIX) 75 MG tablet TAKE 1 TABLET BY MOUTH EVERYDAY AT BEDTIME   empagliflozin (JARDIANCE) 25 MG TABS tablet Take by mouth.   furosemide (LASIX) 20 MG tablet Take 20 mg by mouth daily as needed for fluid.   glimepiride (AMARYL) 2 MG tablet Take 2 mg by mouth in the morning and at bedtime.   glipiZIDE (GLUCOTROL XL) 10 MG 24 hr tablet Take 10 mg by mouth 2 (two) times daily.   isosorbide mononitrate (IMDUR) 60 MG 24 hr tablet Take 3 tablets (180 mg total) by mouth daily.   metFORMIN (GLUCOPHAGE-XR) 500 MG 24 hr tablet Take 1,000 mg by mouth 2 (two) times daily with a meal.   metoprolol tartrate (LOPRESSOR) 50 MG tablet Take 1 tablet (50 mg total) by mouth 2 (two) times daily.   nitroGLYCERIN (NITROSTAT) 0.4 MG SL tablet Place 1 tablet (0.4 mg total) under the tongue every 5 (five) minutes as needed for chest pain. Up to 3 doses   pantoprazole (PROTONIX) 40 MG tablet TAKE 1 TABLET BY MOUTH EVERY DAY   rosuvastatin (CRESTOR) 20 MG tablet Take 1 tablet (20 mg total) by mouth daily.   Semaglutide,0.25 or 0.5MG /DOS, (OZEMPIC, 0.25 OR 0.5 MG/DOSE,) 2 MG/3ML SOPN  Inject into the skin.   sulfamethoxazole-trimethoprim (BACTRIM DS) 800-160 MG tablet Take 1 tablet by mouth 2 (two) times daily.   [DISCONTINUED] icosapent Ethyl (VASCEPA) 1 g capsule Take 2 capsules (2 g total) by mouth 2 (two) times daily.     Allergies:   Patient has no known allergies.   ROS:   Please see the history of present illness.    All other systems reviewed and are negative.  EKGs/Labs/Other Studies Reviewed:    The following studies were reviewed today: Cardiac Studies & Procedures   CARDIAC CATHETERIZATION  CARDIAC CATHETERIZATION 06/29/2020  Narrative  The  left ventricular systolic function is normal.  LV end diastolic pressure is normal.  There is no aortic valve stenosis.  Mid RCA to Dist RCA lesion is 90% stenosed.  Prox RCA-1 lesion is 15% stenosed.  Prox RCA-2 lesion is 70% stenosed.  Prox RCA to Mid RCA lesion is 15% stenosed.  Post intervention, there is a 45% residual stenosis.  Scoring balloon angioplasty was performed using a BALLOON SCOREFLEX 2.50X10.  Post intervention, there is a 10% residual stenosis.  Balloon angioplasty was performed using a BALLOON [place] EUPHORA RX 2.75X8.  Mid LM to Prox LAD lesion is 100% stenosed with 100% stenosed side branch in Ost Cx to Mid Cx.  LIMA.  Mid Cx to Dist Cx lesion is 100% stenosed.  SVG graft was visualized by angiography and is large.  Origin to Prox Graft lesion is 5% stenosed.  Mid Graft to Insertion lesion is 20% stenosed.  SVG graft was visualized by angiography.  Origin to Prox Graft lesion is 100% stenosed.  SUMMARY  Severe native CAD with essentially occluded Left Coronary System, extensively diseased RCA with 2 focal segments of 70% and then 90% in-stent stenosis.  Moderately successful scoring balloon and high-pressure  balloon angioplasty of the stented segment of the RCA reducing the 70% stenosis of 20% in the 90% to 45%.  Widely patent LIMA-LAD with collateral flow to the RCA and LCx  Patent SVG-OM with patent ostial stent, mild in-stent restenosis in the body of the graft with occluded downstream stent in the inferior branch of the LCx with patent OM  Preserved LVEF with no obvious wall motion abnormality; normal LVEDP   RECOMMENDATION  He will remain in-house overnight as outpatient with extended recovery with plan to discharge tomorrow  Continue aggressive risk factor modification    Bryan Lemma, MD  Findings Coronary Findings Diagnostic  Dominance: Right  Left Main Mid LM to Prox LAD lesion is 100% stenosed with 100% stenosed side  branch in Ost Cx to Mid Cx. Essentially all subtotaled 99.9%  Left Anterior Descending There is mild diffuse disease throughout the vessel.  Left Circumflex Mid Cx to Dist Cx lesion is 100% stenosed. The lesion is chronically occluded. The lesion was previously treated using a stent (unknown type) over 2 years ago.  Second Obtuse Marginal Branch There is severe disease in the vessel. Collaterals 2nd Mrg filled by collaterals from 3rd Diag.  Right Coronary Artery Vessel is moderate in size. There is diffuse stented segment from proximal to distal with 15 to 20% ISR throughout Prox RCA-1 lesion is 15% stenosed. The lesion was previously treated. Prox RCA-2 lesion is 70% stenosed. The lesion is focal and concentric. The lesion was previously treated using a stent (unknown type) over 2 years ago. Prox RCA to Mid RCA lesion is 15% stenosed. The lesion was previously treated using a stent (unknown type) . Previously placed stent  displays restenosis. Mid RCA to Dist RCA lesion is 90% stenosed. The lesion is discrete and eccentric. The lesion was previously treated using a stent (unknown type) over 2 years ago. At stent overlap site  Acute Marginal Branch Vessel is small in size.  Right Ventricular Branch Vessel is small in size.  Right Posterior Descending Artery Vessel is small in size.  Right Posterior Atrioventricular Artery Vessel is small in size.  First Right Posterolateral Branch Vessel is small in size.  LIMA LIMA Graft To Mid LAD LIMA. -LAD Tortuous  Saphenous Graft To Mid Cx SVG graft was visualized by angiography and is large. -OM Origin to Prox Graft lesion is 5% stenosed. The lesion was previously treated using a stent (unknown type) and angioplasty over 2 years ago. Mid Graft to Insertion lesion is 20% stenosed. The lesion is focal. The lesion was previously treated using a stent (unknown type) over 2 years ago.  Saphenous Graft To Dist RCA SVG graft was visualized  by angiography. -RCA Origin to Prox Graft lesion is 100% stenosed. The lesion is chronically occluded.  Intervention  Prox RCA-2 lesion Angioplasty Balloon angioplasty was performed using a BALLOON [place] EUPHORA RX 2.75X8. Maximum pressure: 14 atm. Inflation time: 20 sec. Post-Intervention Lesion Assessment The intervention was successful. Pre-interventional TIMI flow is 3. Post-intervention TIMI flow is 3. Treated lesion length:  20 mm. There is a 10% residual stenosis post intervention.  Mid RCA to Dist RCA lesion Angioplasty Lesion length:  8 mm. CATH LAUNCHER [id number] guide catheter was inserted. WIRE ASAHI PROWATER 180CM guidewire used to cross lesion. Scoring balloon angioplasty was performed using a BALLOON SCOREFLEX 2.50X10. Initial angioplasty with 2.0 mm x 12 mm compliant balloon; This was followed by a 2.5 mm [place] balloon then a 2.5 mm x 15 mm scoring balloon followed by 2.75 mm x 8 mm [place] balloon. Maximum pressure: 16 atm. Inflation time: 30 sec. Final postdilated with 2.75 mm x 8 mm [place] balloon - 16 Atm x 30 Sec Post-Intervention Lesion Assessment The intervention was unsuccessful due to lesion rigidity. Only able to reduce the lesion to 45% Pre-interventional TIMI flow is 3. Post-intervention TIMI flow is 3. Treated lesion length:  15 mm. No complications occurred at this lesion. There is a 45% residual stenosis post intervention.   CARDIAC CATHETERIZATION  CARDIAC CATHETERIZATION 03/19/2016  Narrative 1. Severe native vessel coronary artery disease with near occlusion of the entire left coronary artery distribution 2. Continued patency of the stented segments in the right coronary artery with mild in-stent restenosis 3. Status post CABG with continued patency of the LIMA to LAD, chronic occlusion of the saphenous vein graft to acute marginal branch of the RCA, and severe stenosis in the proximal body of the graft to the obtuse marginal as well as severe OM subbranch stenosis, treated  successfully with balloon angioplasty in the body of the vein graft and stenting in the native vessel beyond the vein graft.  Recommend:  Continue dual antiplatelet therapy with aspirin and clopidogrel  Check echocardiogram to assess LV function. Ventriculography not performed because of chronic kidney disease  Findings Coronary Findings Diagnostic  Dominance: Right  Left Anterior Descending  [place] Intermedius  Left Circumflex Collaterals Mid Cx filled by collaterals from 2nd Sept.  Lateral First Obtuse Marginal Branch  Right Coronary Artery The lesion was previously treated using a drug eluting stent over 2 years ago. multiple areas of mild ISR, no more than 30-40% in the tightest areas. no flow-obstructive disease identified.  Saphenous Graft To RPDA SVG graft was visualized by angiography. occluded graft to the acute marginal, chronic, unchanged  LIMA LIMA Graft To Mid LAD LIMA graft was visualized by angiography and is anatomically normal. Widely patent LIMA-LAD  Saphenous Graft To 1st Mrg SVG graft was visualized by angiography. The lesion was previously treated using a drug eluting stent over 2 years ago.  Intervention  Lat 1st Mrg lesion Angioplasty A STENT SYNERGY DES 2.25X16 drug eluting stent was successfully placed. The pre-interventional distal flow is normal (TIMI 3).  The post-interventional distal flow is normal (TIMI 3). No complications occurred at this lesion. A cougar wire is used to cross the lesion. The lesion is pre-dilated with a 2.0 mm balloon and stented with a 2.25x16 mm Synergy DES. The stent is carefully positioned at the ostium of the branch vessel, then deployed at 12 atm, then post-dilated with the stent balloon brought back further proximally. There is a 0% residual stenosis post intervention.  Origin to Prox Graft lesion (Saphenous Graft To 1st Mrg) Angioplasty Angioplasty alone was performed using a BALLOON [place] EUPHORA RX3.5X12. Maximum  pressure: 18 atm. The pre-interventional distal flow is normal (TIMI 3).  The post-interventional distal flow is normal (TIMI 3). The intervention was successful . No complications occurred at this lesion. High pressure balloon inflation is performed. The lesion has been previously stented with 2 layers of DES. An AL-1 guide is used. Heparin is used for anticoagulation. There is no residual stenosis after multiple balloon inflations back to the ostium of the graft. There is a 0% residual stenosis post intervention.     ECHOCARDIOGRAM  ECHOCARDIOGRAM COMPLETE 03/20/2016  Narrative *Katie* *[facility]* [address] [place], [place] [address] [phone]  ------------------------------------------------------------------- Transthoracic Echocardiography  Patient:    Fenton, Candee MR #:       [id number] Study Date: 03/20/2016 Gender:     M Age:        72 Height:     167.6 cm Weight:     85.2 kg BSA:        2.02 m^2 Pt. Status: Room:       6C06C  ADMITTING    Tonny Bollman, MD ATTENDING    Tonny Bollman, MD REFERRING    Tonny Bollman, MD Sallye Ober, Tiffany REFERRING    Neva Seat, Tiffany PERFORMING   [facility], Inpatient SONOGRAPHER  Thurman Coyer  cc:  ------------------------------------------------------------------- LV EF: 60% -   65%  ------------------------------------------------------------------- Indications:      Chest pain 786.51.  ------------------------------------------------------------------- History:   PMH:   Dyspnea.  Coronary artery disease.  PMH: Myocardial infarction.  Risk factors:  Hypertension. Dyslipidemia.  ------------------------------------------------------------------- Study Conclusions  - Left ventricle: The cavity size was normal. Wall thickness was increased in a pattern of mild LVH. Systolic function was normal. The estimated ejection fraction was in the range of 60% to 65%. Wall motion was normal;  there were no regional wall motion abnormalities. Features are consistent with a pseudonormal left ventricular filling pattern, with concomitant abnormal relaxation and increased filling pressure (grade 2 diastolic dysfunction).  ------------------------------------------------------------------- Labs, prior tests, procedures, and surgery: Coronary artery bypass grafting.  ------------------------------------------------------------------- Study data:  Comparison was made to the study of 01/09/2010.  Study status:  Routine.  Procedure:  The patient reported no pain pre or post test. Transthoracic echocardiography. Image quality was adequate.  Study completion:  There were no complications. Transthoracic echocardiography.  M-mode, complete 2D, spectral Doppler, and color Doppler.  Birthdate:  Patient birthdate: 1945-10-22.  Age:  Patient is 77 yr old.  Sex:  Gender: male. BMI: 30.3 kg/m^2.  Blood pressure:     175/77  Patient status: Inpatient.  Study date:  Study date: 03/20/2016. Study time: 10:24 AM.  Location:  Echo laboratory.  -------------------------------------------------------------------  ------------------------------------------------------------------- Left ventricle:  The cavity size was normal. Wall thickness was increased in a pattern of mild LVH. Systolic function was normal. The estimated ejection fraction was in the range of 60% to 65%. Wall motion was normal; there were no regional wall motion abnormalities. Features are consistent with a pseudonormal left ventricular filling pattern, with concomitant abnormal relaxation and increased filling pressure (grade 2 diastolic dysfunction).  ------------------------------------------------------------------- Aortic valve:   Structurally normal valve.   Cusp separation was normal.  Doppler:  Transvalvular velocity was within the normal range. There was no stenosis. There was no  regurgitation.  ------------------------------------------------------------------- Aorta:  Aortic root: The aortic root was normal in size. Ascending aorta: The ascending aorta was normal in size.  ------------------------------------------------------------------- Mitral valve:   Structurally normal valve.   Leaflet separation was normal.  Doppler:  Transvalvular velocity was within the normal range. There was no evidence for stenosis. There was no regurgitation.    Peak gradient (D): 5 mm Hg.  ------------------------------------------------------------------- Left atrium:  The atrium was normal in size.  ------------------------------------------------------------------- Right ventricle:  The cavity size was normal. Systolic function was normal.  ------------------------------------------------------------------- Pulmonic valve:    The valve appears to be grossly normal. Doppler:  There was no significant regurgitation.  ------------------------------------------------------------------- Tricuspid valve:   The valve appears to be grossly normal. Doppler:  There was trivial regurgitation.  ------------------------------------------------------------------- Pulmonary artery:   Systolic pressure was within the normal range.  ------------------------------------------------------------------- Right atrium:  The atrium was normal in size.  ------------------------------------------------------------------- Pericardium:  There was no pericardial effusion.  ------------------------------------------------------------------- Measurements  Left ventricle                         Value        Reference LV ID, ED, PLAX chordal                47.9  mm     43 - 52 LV ID, ES, PLAX chordal                32.9  mm     23 - 38 LV fx shortening, PLAX chordal         31    %      >=29 LV PW thickness, ED                    10.5  mm     --------- IVS/LV PW ratio, ED                    1.14          <=1.3 Stroke volume, 2D                      89    ml     --------- Stroke volume/bsa, 2D                  44    ml/m^2 --------- LV e&', lateral                         6.25  cm/s   --------- LV  E/e&', lateral                       18.08        --------- LV e&', medial                          5.48  cm/s   --------- LV E/e&', medial                        20.62        --------- LV e&', average                         5.87  cm/s   --------- LV E/e&', average                       19.27        ---------  Ventricular septum                     Value        Reference IVS thickness, ED                      12    mm     ---------  LVOT                                   Value        Reference LVOT ID, S                             20    mm     --------- LVOT area                              3.14  cm^2   --------- LVOT peak velocity, S                  115   cm/s   --------- LVOT mean velocity, S                  66.4  cm/s   --------- LVOT VTI, S                            28.2  cm     --------- LVOT peak gradient, S                  5     mm Hg  ---------  Aorta                                  Value        Reference Aortic root ID, ED                     32    mm     ---------  Left atrium                            Value        Reference LA ID,  A-P, ES                         37    mm     --------- LA ID/bsa, A-P                         1.83  cm/m^2 <=2.2 LA volume, ES, 1-p A4C                 27    ml     --------- LA volume/bsa, ES, 1-p A4C             13.4  ml/m^2 --------- LA volume, ES, 1-p A2C                 42    ml     --------- LA volume/bsa, ES, 1-p A2C             20.8  ml/m^2 ---------  Mitral valve                           Value        Reference Mitral E-wave peak velocity            113   cm/s   --------- Mitral A-wave peak velocity            101   cm/s   --------- Mitral deceleration time       (H)     264   ms     150 - 230 Mitral peak gradient, D                 5     mm Hg  --------- Mitral E/A ratio, peak                 1.1          ---------  Pulmonary arteries                     Value        Reference PA pressure, S, DP                     21    mm Hg  <=30  Tricuspid valve                        Value        Reference Tricuspid regurg peak velocity         213   cm/s   --------- Tricuspid peak RV-RA gradient          18    mm Hg  ---------  Systemic veins                         Value        Reference Estimated CVP                          3     mm Hg  ---------  Right ventricle                        Value        Reference RV pressure, S, DP  21    mm Hg  <=30 RV s&', lateral, S                      12.2  cm/s   ---------  Legend: (L)  and  (H)  mark values outside specified reference range.  ------------------------------------------------------------------- Prepared and Electronically Authenticated by  Kristeen Miss, M.D. 2018-03-20T12:30:02             EKG:   EKG Interpretation Date/Time:  Thursday November 08 2022 09:03:22 EST Ventricular Rate:  64 PR Interval:  180 QRS Duration:  92 QT Interval:  394 QTC Calculation: 406 R Axis:   -1  Text Interpretation: Normal sinus rhythm Inferior infarct , age undetermined When compared with ECG of 06-Sep-2022 11:03, Inferior infarct is now Present Confirmed by Tonny Bollman [phone]) on 11/08/2022 9:20:52 AM    Recent Labs: No results found for requested labs within last 365 days.  Recent Lipid Panel    Component Value Date/Time   CHOL 135 04/19/2021 1131   TRIG 194 (H) 04/19/2021 1131   HDL 30 (L) 04/19/2021 1131   CHOLHDL 4.5 04/19/2021 1131   CHOLHDL 4.4 01/11/2015 0820   VLDL 24 01/11/2015 0820   LDLCALC 72 04/19/2021 1131   LDLDIRECT 122.5 12/02/2013 0922           Physical Exam:    VS:  BP (!) 130/94   Pulse 64   Ht 5\' 6"  (1.676 m)   Wt 166 lb 12.8 oz (75.7 kg)   SpO2 99%   BMI 26.92 kg/m     Wt Readings from Last 3  Encounters:  11/08/22 166 lb 12.8 oz (75.7 kg)  10/23/22 164 lb 12.8 oz (74.8 kg)  09/06/22 167 lb 9.6 oz (76 kg)     GEN:  Well nourished, well developed in no acute distress HEENT: Normal NECK: No JVD; No carotid bruits LYMPHATICS: No lymphadenopathy CARDIAC: RRR, no murmurs, rubs, gallops RESPIRATORY:  Clear to auscultation without rales, wheezing or rhonchi  ABDOMEN: Soft, non-tender, non-distended MUSCULOSKELETAL:  No edema; No deformity  SKIN: Warm and dry NEUROLOGIC:  Alert and oriented x 3 PSYCHIATRIC:  Normal affect   Assessment & Plan Coronary artery disease involving native coronary artery of native heart with angina pectoris (HCC) Patient continues to have some mild breakthrough angina after recent PCI of a critical saphenous vein graft lesion.  I reviewed his cath report which described moderate to severe stenosis in the RCA which has been heavily stented.  Medical therapy was recommended but the patient's wife stated that she was told that PCI could be considered in the future.  I am going to send for his cardiac catheterization films so that these can be reviewed in a decision can be made whether he might need further intervention.  I would like to treat him medically considering his very extensive stenting.  However, if there are any critical or severe lesions and he continues to have angina refractory to medical therapy, PCI would be a reasonable consideration.  He will remain on aspirin and clopidogrel for DAPT, and an antianginal program that includes amlodipine, high-dose isosorbide, and metoprolol tartrate. Stage 3b chronic kidney disease (HCC) Reviewed recent labs.  Creatinine is 2.06. PAD (peripheral artery disease) (HCC) Stable with no claudication symptoms at present.  Continue medical therapy Essential hypertension Continue amlodipine, metoprolol, and isosorbide Mixed hyperlipidemia Treated with rosuvastatin with LDL cholesterol 62 Bilateral carotid artery  stenosis Nonobstructive moderate carotid stenosis continue medical management  The patient will follow-up with 3 months with an APP.  I will send for his cardiac catheterization films and contact him once I have had a chance to review them.  He will continue his current medical management.  I will refer him to phase 2 cardiac rehab.      Medication Adjustments/Labs and Tests Ordered: Current medicines are reviewed at length with the patient today.  Concerns regarding medicines are outlined above.  Orders Placed This Encounter  Procedures   AMB referral to cardiac rehabilitation   EKG 12-Lead   No orders of the defined types were placed in this encounter.   Patient Instructions  Medication Instructions:  Your physician recommends that you continue on your current medications as directed. Please refer to the Current Medication list given to you today.  *If you need a refill on your cardiac medications before your next appointment, please call your pharmacy*   Lab Work: None ordered  If you have labs (blood work) drawn today and your tests are completely normal, you will receive your results only by: MyChart Message (if you have MyChart) OR A paper copy in the mail If you have any lab test that is abnormal or we need to change your treatment, we will call you to review the results.   Testing/Procedures: None ordered  You have been referred to [employer] at [facility].  They will call you to arrange   Follow-Up: At [facility], you and your health needs are our priority.  As part of our continuing mission to provide you with exceptional heart care, we have created designated Provider Care Teams.  These Care Teams include your primary Cardiologist (physician) and Advanced Practice Providers (APPs -  Physician Assistants and Nurse Practitioners) who all work together to provide you with the care you need, when you need it.  We recommend signing up for  the patient portal called "MyChart".  Sign up information is provided on this After Visit Summary.  MyChart is used to connect with patients for Virtual Visits (Telemedicine).  Patients are able to view lab/test results, encounter notes, upcoming appointments, etc.  Non-urgent messages can be sent to your provider as well.   To learn more about what you can do with MyChart, go to [email or web address].    Your next appointment:   3 month(s)  Provider:   Tereso Newcomer, PA-C         Other Instructions    Signed, Tonny Bollman, MD  11/08/2022 12:47 PM    [facility]

## 2022-11-08 NOTE — Assessment & Plan Note (Signed)
Continue amlodipine, metoprolol, and isosorbide

## 2022-11-08 NOTE — Assessment & Plan Note (Signed)
Nonobstructive moderate carotid stenosis continue medical management  The patient will follow-up with 3 months with an APP.  I will send for his cardiac catheterization films and contact him once I have had a chance to review them.  He will continue his current medical management.  I will refer him to phase 2 cardiac rehab.

## 2022-11-12 NOTE — Telephone Encounter (Signed)
Referral placed via EPIC on 11/08/22.

## 2022-11-19 DIAGNOSIS — Z955 Presence of coronary angioplasty implant and graft: Secondary | ICD-10-CM | POA: Diagnosis not present

## 2022-11-21 DIAGNOSIS — Z955 Presence of coronary angioplasty implant and graft: Secondary | ICD-10-CM | POA: Diagnosis not present

## 2022-11-22 DIAGNOSIS — I6523 Occlusion and stenosis of bilateral carotid arteries: Secondary | ICD-10-CM | POA: Diagnosis not present

## 2022-11-22 DIAGNOSIS — E538 Deficiency of other specified B group vitamins: Secondary | ICD-10-CM | POA: Diagnosis not present

## 2022-11-22 DIAGNOSIS — E1122 Type 2 diabetes mellitus with diabetic chronic kidney disease: Secondary | ICD-10-CM | POA: Diagnosis not present

## 2022-11-22 DIAGNOSIS — N1832 Chronic kidney disease, stage 3b: Secondary | ICD-10-CM | POA: Diagnosis not present

## 2022-11-22 DIAGNOSIS — I5032 Chronic diastolic (congestive) heart failure: Secondary | ICD-10-CM | POA: Diagnosis not present

## 2022-11-22 DIAGNOSIS — N179 Acute kidney failure, unspecified: Secondary | ICD-10-CM | POA: Diagnosis not present

## 2022-11-22 DIAGNOSIS — I25118 Atherosclerotic heart disease of native coronary artery with other forms of angina pectoris: Secondary | ICD-10-CM | POA: Diagnosis not present

## 2022-11-23 DIAGNOSIS — Z955 Presence of coronary angioplasty implant and graft: Secondary | ICD-10-CM | POA: Diagnosis not present

## 2022-11-26 DIAGNOSIS — Z955 Presence of coronary angioplasty implant and graft: Secondary | ICD-10-CM | POA: Diagnosis not present

## 2022-11-28 DIAGNOSIS — Z955 Presence of coronary angioplasty implant and graft: Secondary | ICD-10-CM | POA: Diagnosis not present

## 2022-12-03 DIAGNOSIS — Z955 Presence of coronary angioplasty implant and graft: Secondary | ICD-10-CM | POA: Diagnosis not present

## 2022-12-05 DIAGNOSIS — Z955 Presence of coronary angioplasty implant and graft: Secondary | ICD-10-CM | POA: Diagnosis not present

## 2022-12-07 DIAGNOSIS — Z955 Presence of coronary angioplasty implant and graft: Secondary | ICD-10-CM | POA: Diagnosis not present

## 2022-12-10 DIAGNOSIS — Z955 Presence of coronary angioplasty implant and graft: Secondary | ICD-10-CM | POA: Diagnosis not present

## 2022-12-12 DIAGNOSIS — Z955 Presence of coronary angioplasty implant and graft: Secondary | ICD-10-CM | POA: Diagnosis not present

## 2022-12-14 DIAGNOSIS — Z955 Presence of coronary angioplasty implant and graft: Secondary | ICD-10-CM | POA: Diagnosis not present

## 2022-12-17 DIAGNOSIS — Z955 Presence of coronary angioplasty implant and graft: Secondary | ICD-10-CM | POA: Diagnosis not present

## 2022-12-19 DIAGNOSIS — Z955 Presence of coronary angioplasty implant and graft: Secondary | ICD-10-CM | POA: Diagnosis not present

## 2022-12-21 DIAGNOSIS — Z955 Presence of coronary angioplasty implant and graft: Secondary | ICD-10-CM | POA: Diagnosis not present

## 2022-12-24 DIAGNOSIS — Z955 Presence of coronary angioplasty implant and graft: Secondary | ICD-10-CM | POA: Diagnosis not present

## 2022-12-28 DIAGNOSIS — Z955 Presence of coronary angioplasty implant and graft: Secondary | ICD-10-CM | POA: Diagnosis not present

## 2022-12-31 DIAGNOSIS — Z955 Presence of coronary angioplasty implant and graft: Secondary | ICD-10-CM | POA: Diagnosis not present

## 2023-02-07 NOTE — Progress Notes (Signed)
 Cardiology Office Note:    Date:  02/08/2023  ID:  [name], DOB Jan 26, 1945, MRN [id number] PCP: Odie Sharper, MD  [facility] Providers Cardiologist:  Sharper Fell, MD Cardiology APP:  Lelon Glendia DASEN, PA-C       Patient Profile:      Coronary artery disease w/ angina S/p CABG in 1993 S/p PCI to S-OM in 2009 and again in 2012, 2014, 2018 S/p POBA to S-OM1 and DES to OM in 2018 S/p POBA to RCA prox and mid ISR 6/22 LHC 06/29/20: L-LAD ok, S-OM stents patent, LCx beyond graft 100, S-RCA 100, RCA 70/90>>PCI NSTEMI ([facility]) 10/2022 s/p 3 x 20 mm DES to S-OM 2/2 ISR LHC 10/25/22: pRCA 50, mRCA 75; RI 100; oLCx 100; pLAD 100; L-LAD ok, S-OM 99 ISR; S-RPDA 100 Heart failure with preserved ejection fraction  TTE 03/20/16: Mild LVH, EF 60-65, Gr 2 DD  TTE ([facility]) 10/24/22: EF 60-65, apical AK, NL RVSF, mild LVH Carotid artery disease US  02/08/22: Bilat ICA 40-59  Peripheral arterial disease S/p bilateral CIA stents ext into distal Aorta in 02/2020 Chronic kidney disease Hypertension Hyperlipidemia       [name] is a 78 y.o. male who returns for follow up of CAD. He was last seen by Dr. Fell in 11/2022. He had just been admitted to the hospital in [place] for a NSTEMI tx with a DES to the S-OM 2/2 ISR. He had residual disease in a heavily stented RCA. The pt was still having some breakthrough chest pain at his last visit. Medical Rx was continued and Dr. Fell requested his films for review.   Discussed the use of AI scribe software for clinical note transcription with the patient, who gave verbal consent to proceed.  History of Present Illness   He has been attending cardiac rehab in [place]. He brings in an assessment sheet from there. His rhythm is NSR and his BP, HR are optimal. He experiences intermittent pain under his left arm, which has worsened slightly since his last visit with Dr. Fell. The pain occurs primarily  when sitting or lying down and becomes tight during exercise, necessitating rest. He describes a constant tightness in the left chest area that never fully resolves. This pain has been present for approximately one to one and a half months. No shortness of breath, syncope, jaw or back discomfort associated with his chest pain, or significant orthopnea. He has not used nitroglycerin  and reports occasional neck pain, which he states is separate from his chest symptoms. He experienced a significant gastrointestinal bleed in January. He went to [facility]. I reviewed the notes from that visit today. He underwent an EGD and colonoscopy, which did not identify a clear source of bleeding. It was suspected he had a diverticular bleed. He has not experienced any further bleeding episodes since then. He was advised to stop aspirin  temporarily but continues to take Plavix . No recent hematochezia or melena.      Review of Systems  Gastrointestinal:  Positive for hematochezia.  -See HPI    Studies Reviewed:        Results    Risk Assessment/Calculations:             Physical Exam:   VS:  BP 120/70   Pulse 62   Ht 5' 6 (1.676 m)   Wt 164 lb 9.6 oz (74.7 kg)   SpO2 96%   BMI 26.57 kg/m  Wt Readings from Last 3 Encounters:  02/08/23 164 lb 9.6 oz (74.7 kg)  11/08/22 166 lb 12.8 oz (75.7 kg)  10/23/22 164 lb 12.8 oz (74.8 kg)    Constitutional:      Appearance: Healthy appearance. Not in distress.  Neck:     Vascular: JVD normal.  Pulmonary:     Breath sounds: Normal breath sounds. No wheezing. No rales.  Cardiovascular:     Normal rate. Regular rhythm.     Murmurs: There is no murmur.  Edema:    Peripheral edema absent.  Abdominal:     Palpations: Abdomen is soft.      Assessment and Plan:   Assessment & Plan Coronary artery disease involving native coronary artery of native heart with angina pectoris [facility]) S/p CABG 1993 and multiple PCI procedures since.  His most  recent intervention was in October 2024 in the setting of a non-STEMI.  He underwent DES to the vein graft to the OM secondary to in-stent restenosis.  He also has a lot of restenosis in the RCA.  Overall, his angina is fairly stable.  He is able to participate in cardiac rehab.  I have reviewed his case with Dr. Wonda.  His RCA could be approached with PCI but chances for long-term patency is low.  I discussed this with the patient and offered medical therapy versus relook cardiac catheterization.  He prefers to continue to try medical therapy. -Increase Imdur  to 120 mg twice daily -Increase metoprolol  to tartrate to 75 mg twice daily -Continue amlodipine  10 mg daily, Plavix  25 mg daily, nitroglycerin  as needed, Crestor  20 mg daily -Resume ASA 81 mg daily -Follow-up 3 months Chronic heart failure with preserved ejection fraction (HCC) Overall volume status stable.  Echocardiogram in October 2024 with EF 60-65.  He is NYHA II. -Continue Lasix  20 mg daily as needed Stage 3b chronic kidney disease (HCC) Recent creatinine in January fairly stable at 1.4. Essential hypertension Blood pressure controlled.  Adjust isosorbide  and metoprolol  for antianginal effect. Hyperlipidemia with target LDL less than 70 Continue Crestor  20 mg daily.  Obtain fasting lipids today. Bilateral carotid artery stenosis Stable on ultrasound manage February 2024.  Follow-up ultrasound pending. Hematochezia I reviewed his hospital notes from early January 2025 at [facility].  It appears he had a diverticular bleed.  Colonoscopy and endoscopy did not demonstrate any significant findings other than diverticulum.  The notes indicate that he could resume aspirin  and Plavix .  He has been holding aspirin  for the last couple of weeks.  I have asked him to resume aspirin  81 mg daily.  We discussed the importance of a high-fiber diet. PAD (peripheral artery disease) (HCC) Recent Dopplers with patent iliac stents.   Continue follow-up with Dr. Arida as planned.      Dispo:  Return in about 3 months (around 05/08/2023) for Routine Follow Up, w/ Dr. Wonda, or Glendia Ferrier, PA-C.  Signed, Glendia Ferrier, PA-C

## 2023-02-08 ENCOUNTER — Other Ambulatory Visit: Payer: Self-pay | Admitting: Physician Assistant

## 2023-02-08 ENCOUNTER — Ambulatory Visit: Payer: Medicare Other | Attending: Physician Assistant | Admitting: Physician Assistant

## 2023-02-08 ENCOUNTER — Encounter: Payer: Self-pay | Admitting: Physician Assistant

## 2023-02-08 VITALS — BP 120/70 | HR 62 | Ht 66.0 in | Wt 164.6 lb

## 2023-02-08 DIAGNOSIS — I739 Peripheral vascular disease, unspecified: Secondary | ICD-10-CM

## 2023-02-08 DIAGNOSIS — I10 Essential (primary) hypertension: Secondary | ICD-10-CM

## 2023-02-08 DIAGNOSIS — I5032 Chronic diastolic (congestive) heart failure: Secondary | ICD-10-CM | POA: Diagnosis not present

## 2023-02-08 DIAGNOSIS — N1832 Chronic kidney disease, stage 3b: Secondary | ICD-10-CM

## 2023-02-08 DIAGNOSIS — K921 Melena: Secondary | ICD-10-CM

## 2023-02-08 DIAGNOSIS — I25119 Atherosclerotic heart disease of native coronary artery with unspecified angina pectoris: Secondary | ICD-10-CM | POA: Diagnosis not present

## 2023-02-08 DIAGNOSIS — E785 Hyperlipidemia, unspecified: Secondary | ICD-10-CM

## 2023-02-08 DIAGNOSIS — I6523 Occlusion and stenosis of bilateral carotid arteries: Secondary | ICD-10-CM

## 2023-02-08 MED ORDER — METOPROLOL TARTRATE 50 MG PO TABS: 75.0000 mg | ORAL_TABLET | Freq: Two times a day (BID) | ORAL | 3 refills | Status: AC

## 2023-02-08 MED ORDER — ISOSORBIDE MONONITRATE ER 60 MG PO TB24: 120.0000 mg | ORAL_TABLET | Freq: Two times a day (BID) | ORAL | 3 refills | Status: AC

## 2023-02-08 NOTE — Assessment & Plan Note (Signed)
 Stable on ultrasound manage February 2024.  Follow-up ultrasound pending.

## 2023-02-08 NOTE — Patient Instructions (Signed)
 Medication Instructions:  Your physician has recommended you make the following change in your medication:   INCREASE Lopressor  to 50 mg taking 1 1/2 tablet twice a day  INCREASE Isosorbide  to 60 mg taking 2 tablets twice a day  *If you need a refill on your cardiac medications before your next appointment, please call your pharmacy*   Lab Work: TODAY:  LIPID  If you have labs (blood work) drawn today and your tests are completely normal, you will receive your results only by: MyChart Message (if you have MyChart) OR A paper copy in the mail If you have any lab test that is abnormal or we need to change your treatment, we will call you to review the results.   Testing/Procedures: None ordered   Follow-Up: At [facility], you and your health needs are our priority.  As part of our continuing mission to provide you with exceptional heart care, we have created designated Provider Care Teams.  These Care Teams include your primary Cardiologist (physician) and Advanced Practice Providers (APPs -  Physician Assistants and Nurse Practitioners) who all work together to provide you with the care you need, when you need it.  We recommend signing up for the patient portal called MyChart.  Sign up information is provided on this After Visit Summary.  MyChart is used to connect with patients for Virtual Visits (Telemedicine).  Patients are able to view lab/test results, encounter notes, upcoming appointments, etc.  Non-urgent messages can be sent to your provider as well.   To learn more about what you can do with MyChart, go to [email or web address].    Your next appointment:   3 month(s)  Provider:   Ozell Fell, MD  or Glendia Ferrier, PA-C         Other Instructions   1st Floor: - Lobby - Registration  - Pharmacy  - Lab - Cafe  2nd Floor: - PV Lab - Diagnostic Testing (echo, CT, nuclear med)  3rd Floor: - Vacant  4th Floor: - TCTS (cardiothoracic surgery) -  AFib Clinic - Structural Heart Clinic - Vascular Surgery  - Vascular Ultrasound  5th Floor: - HeartCare Cardiology (general and EP) - Clinical Pharmacy for coumadin, hypertension, lipid, weight-loss medications, and med management appointments    Valet parking services will be available as well.

## 2023-02-08 NOTE — Assessment & Plan Note (Signed)
 Continue Crestor  20 mg daily.  Obtain fasting lipids today.

## 2023-02-08 NOTE — Assessment & Plan Note (Signed)
 Recent Dopplers with patent iliac stents.  Continue follow-up with Dr. Arida as planned.

## 2023-02-08 NOTE — Assessment & Plan Note (Signed)
 Blood pressure controlled.  Adjust isosorbide  and metoprolol  for antianginal effect.

## 2023-02-08 NOTE — Assessment & Plan Note (Signed)
 Recent creatinine in January fairly stable at 1.4.

## 2023-02-08 NOTE — Assessment & Plan Note (Signed)
 S/p CABG 1993 and multiple PCI procedures since.  His most recent intervention was in October 2024 in the setting of a non-STEMI.  He underwent DES to the vein graft to the OM secondary to in-stent restenosis.  He also has a lot of restenosis in the RCA.  Overall, his angina is fairly stable.  He is able to participate in cardiac rehab.  I have reviewed his case with Dr. Wonda.  His RCA could be approached with PCI but chances for long-term patency is low.  I discussed this with the patient and offered medical therapy versus relook cardiac catheterization.  He prefers to continue to try medical therapy. -Increase Imdur  to 120 mg twice daily -Increase metoprolol  to tartrate to 75 mg twice daily -Continue amlodipine  10 mg daily, Plavix  25 mg daily, nitroglycerin  as needed, Crestor  20 mg daily -Resume ASA 81 mg daily -Follow-up 3 months

## 2023-02-08 NOTE — Assessment & Plan Note (Signed)
 Overall volume status stable.  Echocardiogram in October 2024 with EF 60-65.  He is NYHA II. -Continue Lasix  20 mg daily as needed

## 2023-03-09 IMAGING — CR DG CHEST 2V
2 series · 2 of 2 positions shown · non-contrast
Comparison: 09/17/2013

CLINICAL DATA: 74-year-old male with history of abnormal lung exam.

EXAM:
CHEST - 2 VIEW

[w chest pa *]
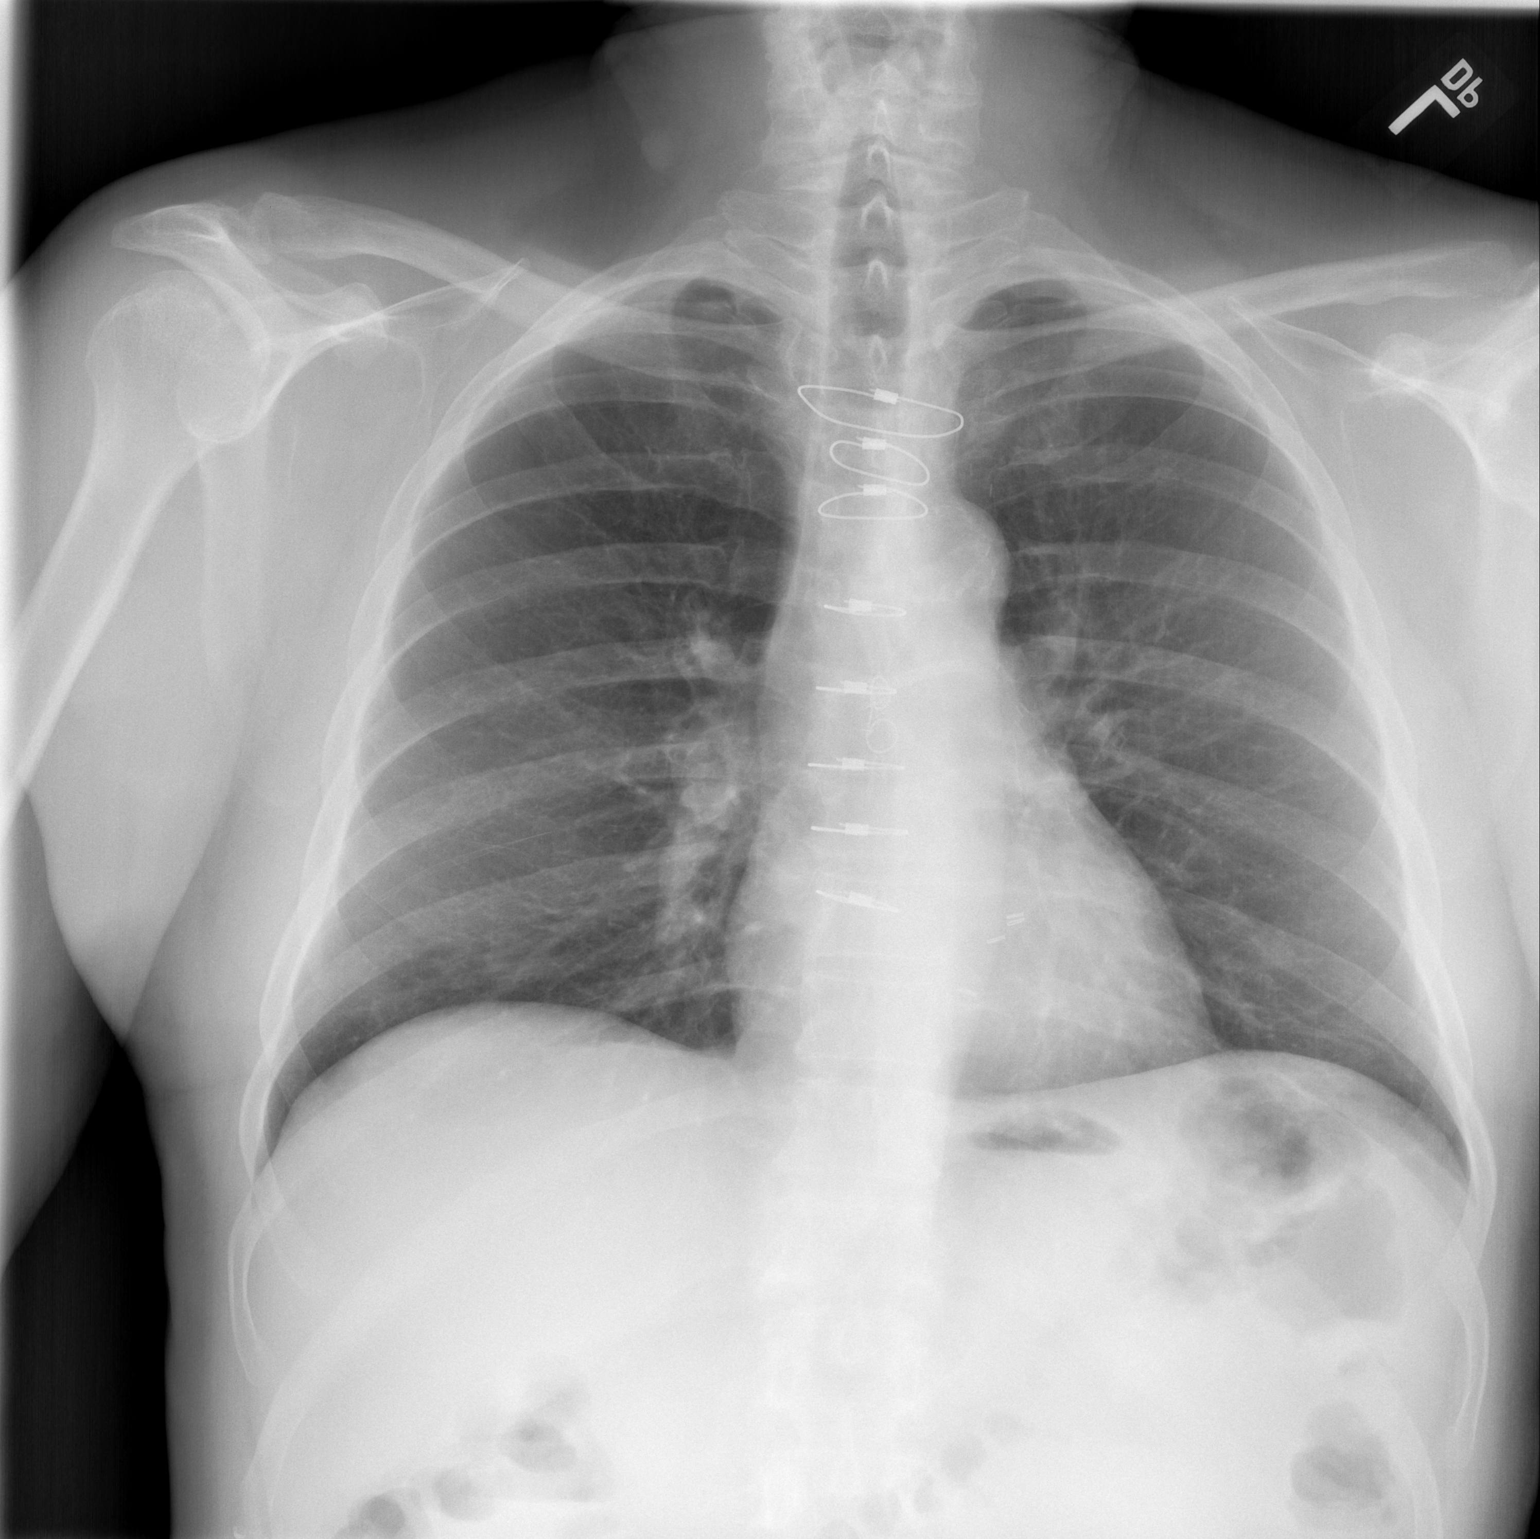

[w chest lat *]
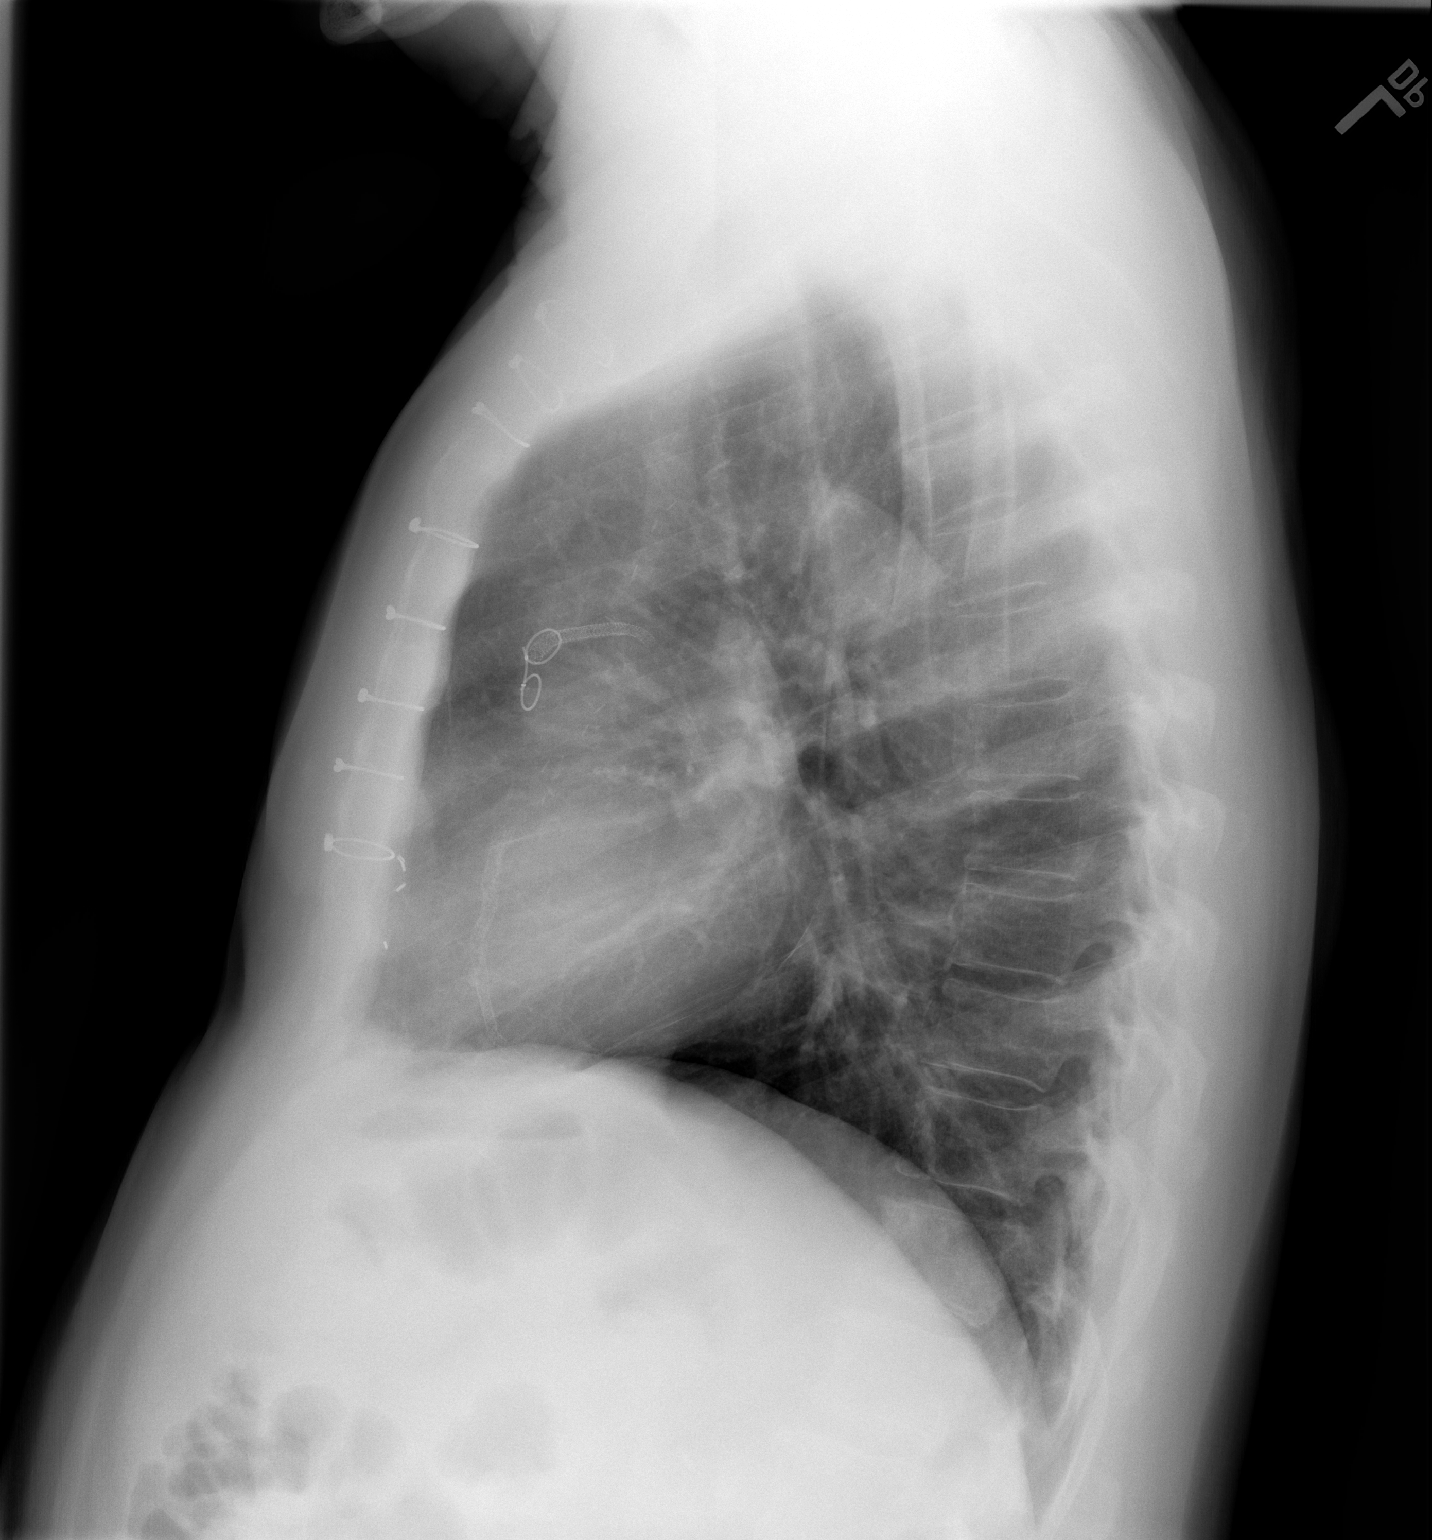

[2 of 2 positions shown; findings below may reference images not displayed]

FINDINGS: Cardiomediastinal silhouette unchanged in size and contour. No
evidence of central vascular congestion. No interlobular septal
thickening.

Surgical changes of median sternotomy and CABG. Evidence of prior
PTC I/coronary disease.

Interval resolution of opacities at the bilateral lung bases with no
new confluent airspace disease.

No pneumothorax or pleural effusion. Coarsened interstitial markings
persist.

No acute displaced fracture. Degenerative changes of the spine.
IMPRESSION: Improved appearance of the chest x-ray to the prior, with no
evidence of acute cardiopulmonary disease.

Surgical changes of median sternotomy and CABG, as well as prior PTC
I

## 2023-05-15 ENCOUNTER — Ambulatory Visit: Payer: Medicare Other | Admitting: Physician Assistant

## 2023-06-11 NOTE — Progress Notes (Signed)
 "    OFFICE NOTE Date:  06/12/2023  ID:  [name], DOB 1945/08/13, MRN [id number] PCP: Odie Sharper, MD  [facility] Providers Cardiologist:  Sharper Fell, MD Cardiology APP:  Lelon Glendia DASEN, PA-C     Patient Profile:     Coronary artery disease w/ angina S/p CABG in 1993 S/p PCI to S-OM in 2009 and again in 2012, 2014, 2018 S/p POBA to S-OM1 and DES to OM in 2018 S/p POBA to RCA prox and mid ISR 6/22 LHC 06/29/20: L-LAD ok, S-OM stents patent, LCx beyond graft 100, S-RCA 100, RCA 70/90>>[facility] NSTEMI ([facility]) 10/2022 s/p 3 x 20 mm DES to S-OM 2/2 ISR LHC 10/25/22: pRCA 50, mRCA 75; RI 100; oLCx 100; pLAD 100; L-LAD ok, S-OM 99 ISR; S-RPDA 100 Heart failure with preserved ejection fraction  TTE 03/20/16: Mild LVH, EF 60-65, Gr 2 DD  TTE ([facility]) 10/24/22: EF 60-65, apical AK, NL RVSF, mild LVH Carotid artery disease US  02/08/22: Bilat ICA 40-59  Peripheral arterial disease S/p bilateral CIA stents ext into distal Aorta in 02/2020 Chronic kidney disease Hypertension Hyperlipidemia   GI bleed 01/2023 -suspected diverticular bleed       Discussed the use of AI scribe software for clinical note transcription with the patient, who gave verbal consent to proceed. History of Present Illness [name] is a 78 y.o. male who returns for follow-up of CAD.  He was last seen in February 2025.  Prior to that, he had seen Dr. Fell after an admission in [place] with a non-STEMI treated with DES to the vein graft to the OM secondary to in-stent restenosis.  He continued to experience intermittent anginal symptoms (left axillary pain) as well as constant chest discomfort.  At that time, I reviewed his case with Dr. Fell.  It was noted that his RCA could be approached with PCI but chances for long-term patency were low.  I discussed this with the patient and with shared decision making, we decided to further titrate his antianginal regimen.  I  increased his isosorbide  to 120 mg twice daily and metoprolol  tartrate to 75 mg twice daily.  He is here with his wife.  Since his last visit, he has had to take nitroglycerin  more frequently, approximately six times over the past few months, with two doses taken last week. He describes episodes of shortness of breath and occasional sweating, but no pain at rest. No nausea, dizziness, syncope, or leg swelling. The angina is more frequent but not necessarily more intense, as one nitroglycerin  tablet typically resolves the symptoms within two to three minutes. He reports pain in his posterior right leg, especially while driving, but denies claudication symptoms.  He notes some fatigue.  He has not been tested for sleep apnea and does not snore.  ROS-See HPI    Studies Reviewed:  EKG Interpretation Date/Time:  Wednesday June 12 2023 10:22:11 EDT Ventricular Rate:  64 PR Interval:  202 QRS Duration:  82 QT Interval:  396 QTC Calculation: 408 R Axis:   -33  Text Interpretation: Normal sinus rhythm Left axis deviation Inferior infarct No significant change since last tracing dated 11/08/22 Confirmed by Lelon Glendia [phone] on 06/12/2023 10:23:57 AM   Results Labs reviewed-via Care Everywhere 05/23/2023: Total cholesterol 115, triglycerides 198, HDL 28, LDL 60, K 4.6, creatinine 1.7, EGFR 41, ALT 12, Hgb 13.9  Risk Assessment/Calculations:          Physical Exam:  VS:  BP 136/74   Pulse 67   Ht 5' 5 (1.651 m)   Wt 160 lb 3.2 oz (72.7 kg)   SpO2 97%   BMI 26.66 kg/m    Wt Readings from Last 3 Encounters:  06/12/23 160 lb 3.2 oz (72.7 kg)  02/08/23 164 lb 9.6 oz (74.7 kg)  11/08/22 166 lb 12.8 oz (75.7 kg)    Constitutional:      Appearance: Healthy appearance. Not in distress.  Neck:     Vascular: JVD normal.  Pulmonary:     Breath sounds: Normal breath sounds. No wheezing. No rales.  Cardiovascular:     Normal rate. Regular rhythm.     Murmurs: There is no murmur.  Edema:     Peripheral edema absent.  Abdominal:     Palpations: Abdomen is soft.        Assessment & Plan Coronary artery disease involving native coronary artery of native heart with angina pectoris [facility]) S/p CABG 1993 and multiple PCI procedures since.  His most recent intervention was in October 2024 in the setting of a non-STEMI.  He underwent DES to the vein graft to the OM secondary to in-stent restenosis.  He also has a lot of restenosis in the RCA.  He has chronic angina which is slightly worse since last visit. EKG today without acute changes.  We discussed continued medical therapy versus cardiac catheterization.  As noted previously, chances for long-term patency of his RCA are low.  He prefers medical therapy.  We discussed starting Ranexa  to control symptoms before considering heart catheterization. - Start Ranexa  500 mg twice daily - Continue amlodipine  10 mg daily - Continue aspirin  81 mg daily - Continue Plavix  75 mg daily - Continue Imdur  120 mg twice daily - Continue metoprolol  tartrate 75 mg twice daily - Continue nitroglycerin  as needed - Follow-up in 6 weeks with repeat EKG  - Advise to return sooner if needed Essential hypertension The patient's blood pressure is controlled on his current regimen.  Continue current therapy.   PAD (peripheral artery disease) (HCC) Bilateral CIA stents in 2022. Reports pain in posterior right leg consistent with sciatica. No symptoms of claudication. - Follow up with Dr. Darron as planned Bilateral carotid artery stenosis Last carotid ultrasound in February 2024. He is past due for follow-up. - Arrange follow-up carotid Doppler ultrasound Stage 3b chronic kidney disease (HCC) Recent creatinine 1.7, creatinine clearance 65.        Dispo:  Return in about 6 weeks (around 07/24/2023) for Routine Follow Up, w/ Dr. Wonda, or Glendia Ferrier, PA-C.  Signed, Glendia Ferrier, PA-C   "

## 2023-06-12 ENCOUNTER — Ambulatory Visit: Attending: Physician Assistant | Admitting: Physician Assistant

## 2023-06-12 ENCOUNTER — Encounter: Payer: Self-pay | Admitting: Physician Assistant

## 2023-06-12 VITALS — BP 136/74 | HR 67 | Ht 65.0 in | Wt 160.2 lb

## 2023-06-12 DIAGNOSIS — I6523 Occlusion and stenosis of bilateral carotid arteries: Secondary | ICD-10-CM | POA: Diagnosis not present

## 2023-06-12 DIAGNOSIS — E785 Hyperlipidemia, unspecified: Secondary | ICD-10-CM

## 2023-06-12 DIAGNOSIS — N1832 Chronic kidney disease, stage 3b: Secondary | ICD-10-CM

## 2023-06-12 DIAGNOSIS — I10 Essential (primary) hypertension: Secondary | ICD-10-CM | POA: Diagnosis not present

## 2023-06-12 DIAGNOSIS — I25119 Atherosclerotic heart disease of native coronary artery with unspecified angina pectoris: Secondary | ICD-10-CM | POA: Diagnosis not present

## 2023-06-12 DIAGNOSIS — I739 Peripheral vascular disease, unspecified: Secondary | ICD-10-CM | POA: Diagnosis not present

## 2023-06-12 DIAGNOSIS — I5032 Chronic diastolic (congestive) heart failure: Secondary | ICD-10-CM

## 2023-06-12 MED ORDER — NITROGLYCERIN 0.4 MG SL SUBL: 0.4000 mg | SUBLINGUAL_TABLET | SUBLINGUAL | 11 refills | Status: AC | PRN

## 2023-06-12 MED ORDER — RANOLAZINE ER 500 MG PO TB12: 500.0000 mg | ORAL_TABLET | Freq: Two times a day (BID) | ORAL | 11 refills | Status: AC

## 2023-06-12 NOTE — Assessment & Plan Note (Signed)
 Recent creatinine 1.7, creatinine clearance 65.

## 2023-06-12 NOTE — Assessment & Plan Note (Addendum)
 S/p CABG 1993 and multiple PCI procedures since.  His most recent intervention was in October 2024 in the setting of a non-STEMI.  He underwent DES to the vein graft to the OM secondary to in-stent restenosis.  He also has a lot of restenosis in the RCA.  He has chronic angina which is slightly worse since last visit. EKG today without acute changes.  We discussed continued medical therapy versus cardiac catheterization.  As noted previously, chances for long-term patency of his RCA are low.  He prefers medical therapy.  We discussed starting Ranexa to control symptoms before considering heart catheterization. - Start Ranexa 500 mg twice daily - Continue amlodipine  10 mg daily - Continue aspirin  81 mg daily - Continue Plavix  75 mg daily - Continue Imdur  120 mg twice daily - Continue metoprolol  tartrate 75 mg twice daily - Continue nitroglycerin  as needed - Follow-up in 6 weeks with repeat EKG  - Advise to return sooner if needed

## 2023-06-12 NOTE — Assessment & Plan Note (Signed)
The patient's blood pressure is controlled on his current regimen.  Continue current therapy.  

## 2023-06-12 NOTE — Patient Instructions (Signed)
 Medication Instructions:  Your physician has recommended you make the following change in your medication:   START Ranexa 500 mg taking 1 twice a day  *If you need a refill on your cardiac medications before your next appointment, please call your pharmacy*  Lab Work: None ordered  If you have labs (blood work) drawn today and your tests are completely normal, you will receive your results only by: MyChart Message (if you have MyChart) OR A paper copy in the mail If you have any lab test that is abnormal or we need to change your treatment, we will call you to review the results.  Testing/Procedures: Your physician has requested that you have a carotid duplex. This test is an ultrasound of the carotid arteries in your neck. It looks at blood flow through these arteries that supply the brain with blood. Allow one hour for this exam. There are no restrictions or special instructions.   Follow-Up: At [facility], you and your health needs are our priority.  As part of our continuing mission to provide you with exceptional heart care, our providers are all part of one team.  This team includes your primary Cardiologist (physician) and Advanced Practice Providers or APPs (Physician Assistants and Nurse Practitioners) who all work together to provide you with the care you need, when you need it.  Your next appointment:   6 week(s)  Provider:   Arnoldo Lapping, MD or Marlyse Single, PA-C          We recommend signing up for the patient portal called MyChart.  Sign up information is provided on this After Visit Summary.  MyChart is used to connect with patients for Virtual Visits (Telemedicine).  Patients are able to view lab/test results, encounter notes, upcoming appointments, etc.  Non-urgent messages can be sent to your provider as well.   To learn more about what you can do with MyChart, go to [email or web address].   Other Instructions

## 2023-06-12 NOTE — Assessment & Plan Note (Signed)
 Last carotid ultrasound in February 2024. He is past due for follow-up. - Arrange follow-up carotid Doppler ultrasound

## 2023-06-12 NOTE — Assessment & Plan Note (Signed)
 Bilateral CIA stents in 2022. Reports pain in posterior right leg consistent with sciatica. No symptoms of claudication. - Follow up with Dr. Alvenia Aus as planned

## 2023-07-12 ENCOUNTER — Ambulatory Visit (HOSPITAL_COMMUNITY)
Admission: RE | Admit: 2023-07-12 | Discharge: 2023-07-12 | Disposition: A | Discharge status: Home / Self Care | Source: Ambulatory Visit | Attending: Physician Assistant | Admitting: Physician Assistant

## 2023-07-12 DIAGNOSIS — I6523 Occlusion and stenosis of bilateral carotid arteries: Secondary | ICD-10-CM | POA: Diagnosis present

## 2023-07-15 ENCOUNTER — Ambulatory Visit: Payer: Self-pay | Admitting: Physician Assistant

## 2023-07-15 DIAGNOSIS — I6523 Occlusion and stenosis of bilateral carotid arteries: Secondary | ICD-10-CM

## 2023-07-17 ENCOUNTER — Telehealth: Payer: Self-pay | Admitting: Physician Assistant

## 2023-07-17 NOTE — Telephone Encounter (Signed)
  Glendia ONEIDA Ferrier, PA-C 07/15/2023  4:29 PM EDT     Carotid ultrasound demonstrates moderate bilateral carotid artery disease.  Left-sided disease is slightly worse since last study. PLAN:  - Continue current medications/treatment plan and follow up as scheduled.  - Repeat carotid US  1 year (order placed) Glendia Ferrier, PA-C    07/15/2023 4:26 PM    Went over the information above. He verbalized understanding and has appt 07/29/23.

## 2023-07-17 NOTE — Telephone Encounter (Signed)
Pt returning nurses call regarding test results. Please advise

## 2023-07-28 NOTE — Progress Notes (Signed)
 OFFICE NOTE:    Date:  07/29/2023  ID:  [name], DOB [birth year], MRN [id number] PCP: Odie Sharper, MD  [facility] Providers Cardiologist:  Sharper Fell, MD Cardiology APP:  Lelon Glendia DASEN, PA-C       Coronary artery disease w/ angina S/p CABG in 1993 S/p PCI to S-OM in 2009 and again in 2012, 2014, 2018 S/p POBA to S-OM1 and DES to OM in 2018 S/p POBA to RCA prox and mid ISR 6/22 LHC 06/29/20: L-LAD ok, S-OM stents patent, LCx beyond graft 100, S-RCA 100, RCA 70/90>>PCI NSTEMI ([facility]) 10/2022 s/p 3 x 20 mm DES to S-OM 2/2 ISR LHC 10/25/22: pRCA 50, mRCA 75; RI 100; oLCx 100; pLAD 100; L-LAD ok, S-OM 99 ISR; S-RPDA 100 Heart failure with preserved ejection fraction  TTE 03/20/16: Mild LVH, EF 60-65, Gr 2 DD  TTE ([facility]) 10/24/22: EF 60-65, apical AK, NL RVSF, mild LVH Carotid artery disease US  07/14/23: L 60-79, R 40-59 >> repeat 1 year Peripheral arterial disease S/p bilateral CIA stents ext into distal Aorta in 02/2020 Chronic kidney disease Hypertension Hyperlipidemia   GI bleed 01/2023 -suspected diverticular bleed       Discussed the use of AI scribe software for clinical note transcription with the patient, who gave verbal consent to proceed. History of Present Illness [name] is a 78 y.o. male who returns for follow up of CAD. He was last seen in 06/2023. He was admitted to a hospital in [place], [place] in 10/2022 with a NSTEMI tx with DES to S-OM due to ISR. He has residual mRCA 75% stenosis and an occluded S-RPDA. PCI of the RCA could be attempted but chances for long term patency are low. We have continued to try to adjust his antianginal regimen.   At his last visit, he noted an escalation in symptoms for which he was taking NTG. I started him on Ranolazine  500 mg twice daily.   Since starting ranolazine , his symptoms have improved, and he has only needed nitroglycerin  on three occasions. He experiences  chest tightness primarily on the left side, sometimes radiating under his arm, and describes it as 'tight' and 'sore'. These episodes occur with activity, particularly when descending steps or walking downhill, but not when ascending or during other activities Rice unloading a boat. No chest pain at rest, radiation to the neck or back, or associated symptoms Rice dizziness or vision changes. He reports no side effects from ranolazine  but notes a reduction in the frequency of nitroglycerin  use. No shortness of breath, leg swelling, dizziness, vision changes, or passing out. He reports occasional headaches, with one significant episode occurring two days prior, but no associated neurological symptoms.    Review of Systems  Neurological:  Positive for headaches.  -See HPI     Studies Reviewed:  EKG Interpretation Date/Time:  Monday July 29 2023 08:08:55 EDT Ventricular Rate:  73 PR Interval:  168 QRS Duration:  88 QT Interval:  400 QTC Calculation: 440 R Axis:   -42  Text Interpretation: Normal sinus rhythm Left axis deviation Inferior infarct No significant change since last tracing Confirmed by Lelon Glendia [phone]) on 07/29/2023 8:16:37 AM   Results LABS - Care Everywhere Total cholesterol: 115 (05/23/2023) Triglycerides: 198 (05/23/2023) HDL: 28 (05/23/2023) LDL: 60 (05/23/2023) Potassium: 4.6 (05/23/2023) Creatinine: 1.7 (05/23/2023) ALT: 12 (05/23/2023) Hemoglobin: 13.9 (05/23/2023)  Risk Assessment/Calculations:          Physical Exam:  VS:  BP 130/72   Pulse 73   Ht 5' 6 (1.676 m)   Wt 154 lb 6.4 oz (70 kg)   SpO2 100%   BMI 24.92 kg/m        Wt Readings from Last 3 Encounters:  07/29/23 154 lb 6.4 oz (70 kg)  06/12/23 160 lb 3.2 oz (72.7 kg)  02/08/23 164 lb 9.6 oz (74.7 kg)    Constitutional:      Appearance: Healthy appearance. Not in distress.  Neck:     Vascular: JVD normal.  Pulmonary:     Breath sounds: Normal breath sounds. No wheezing. No rales.   Cardiovascular:     Normal rate. Regular rhythm.     Murmurs: There is no murmur.  Edema:    Peripheral edema absent.  Abdominal:     Palpations: Abdomen is soft.       Assessment and Plan:    Assessment & Plan Coronary artery disease involving native coronary artery of native heart with angina pectoris [facility]) S/p CABG 1993 and multiple PCI procedures since.  His most recent intervention was in October 2024 in the setting of a non-STEMI.  He underwent DES to the vein graft to the OM secondary to in-stent restenosis.  He also has a lot of restenosis in the RCA. As noted previously, chances for long-term patency of his RCA are low. At last visit in June, I added Ranolazine . Symptoms are improved since starting ranolazine . He has taken NTG only three times since last visit. We discussed continuing current Rx vs increasing metoprolol  vs increasing Ranolazine . He prefers continuing current medications. I agree with this approach.  - Continue amlodipine  10 mg daily - Continue aspirin  81 mg daily - Continue Plavix  75 mg daily - Continue Imdur  120 mg twice daily - Continue metoprolol  tartrate 75 mg twice daily - Continue nitroglycerin  as needed - Continue ranolazine  500 mg twice daily  Bilateral carotid artery stenosis Left ICA has 60-79% stenosis and right ICA has 40-59% stenosis. - Repeat carotid ultrasound in one year Essential hypertension BP controlled. - Continue amlodipine  10 mg daily - Continue Imdur  120 mg twice daily - Continue metoprolol  tartrate 75 mg twice daily Hyperlipidemia with target LDL less than 70 LDL levels are optimal.   - Continue Crestor  20 mg daily      Dispo:  Return in about 6 months (around 01/29/2024) for Routine Follow Up, w/ Dr. Wonda.  Signed, Glendia Ferrier, PA-C

## 2023-07-28 NOTE — Assessment & Plan Note (Signed)
 S/p CABG 1993 and multiple PCI procedures since.  His most recent intervention was in October 2024 in the setting of a non-STEMI.  He underwent DES to the vein graft to the OM secondary to in-stent restenosis.  He also has a lot of restenosis in the RCA. As noted previously, chances for long-term patency of his RCA are low. At last visit in June, I added Ranolazine . Symptoms are improved since starting ranolazine . He has taken NTG only three times since last visit. We discussed continuing current Rx vs increasing metoprolol  vs increasing Ranolazine . He prefers continuing current medications. I agree with this approach.  - Continue amlodipine  10 mg daily - Continue aspirin  81 mg daily - Continue Plavix  75 mg daily - Continue Imdur  120 mg twice daily - Continue metoprolol  tartrate 75 mg twice daily - Continue nitroglycerin  as needed - Continue ranolazine  500 mg twice daily

## 2023-07-29 ENCOUNTER — Encounter: Payer: Self-pay | Admitting: Physician Assistant

## 2023-07-29 ENCOUNTER — Ambulatory Visit: Attending: Physician Assistant | Admitting: Physician Assistant

## 2023-07-29 VITALS — BP 130/72 | HR 73 | Ht 66.0 in | Wt 154.4 lb

## 2023-07-29 DIAGNOSIS — E785 Hyperlipidemia, unspecified: Secondary | ICD-10-CM

## 2023-07-29 DIAGNOSIS — N1832 Chronic kidney disease, stage 3b: Secondary | ICD-10-CM

## 2023-07-29 DIAGNOSIS — I10 Essential (primary) hypertension: Secondary | ICD-10-CM

## 2023-07-29 DIAGNOSIS — I6523 Occlusion and stenosis of bilateral carotid arteries: Secondary | ICD-10-CM | POA: Diagnosis not present

## 2023-07-29 DIAGNOSIS — I25119 Atherosclerotic heart disease of native coronary artery with unspecified angina pectoris: Secondary | ICD-10-CM | POA: Diagnosis not present

## 2023-07-29 NOTE — Assessment & Plan Note (Signed)
 BP controlled. - Continue amlodipine  10 mg daily - Continue Imdur  120 mg twice daily - Continue metoprolol  tartrate 75 mg twice daily

## 2023-07-29 NOTE — Assessment & Plan Note (Signed)
 LDL levels are optimal.   - Continue Crestor  20 mg daily

## 2023-07-29 NOTE — Assessment & Plan Note (Signed)
 Left ICA has 60-79% stenosis and right ICA has 40-59% stenosis. - Repeat carotid ultrasound in one year

## 2023-07-29 NOTE — Patient Instructions (Signed)
 Medication Instructions:  Your physician recommends that you continue on your current medications as directed. Please refer to the Current Medication list given to you today.  *If you need a refill on your cardiac medications before your next appointment, please call your pharmacy*  Lab Work: None ordered  If you have labs (blood work) drawn today and your tests are completely normal, you will receive your results only by: MyChart Message (if you have MyChart) OR A paper copy in the mail If you have any lab test that is abnormal or we need to change your treatment, we will call you to review the results.  Testing/Procedures: None ordered  Follow-Up: At [facility], you and your health needs are our priority.  As part of our continuing mission to provide you with exceptional heart care, our providers are all part of one team.  This team includes your primary Cardiologist (physician) and Advanced Practice Providers or APPs (Physician Assistants and Nurse Practitioners) who all work together to provide you with the care you need, when you need it.  Your next appointment:   6 month(s)  Provider:   Ozell Fell, MD    We recommend signing up for the patient portal called MyChart.  Sign up information is provided on this After Visit Summary.  MyChart is used to connect with patients for Virtual Visits (Telemedicine).  Patients are able to view lab/test results, encounter notes, upcoming appointments, etc.  Non-urgent messages can be sent to your provider as well.   To learn more about what you can do with MyChart, go to [email or web address].   Other Instructions

## 2023-09-01 ENCOUNTER — Other Ambulatory Visit: Payer: Self-pay | Admitting: Cardiovascular Disease

## 2023-09-03 ENCOUNTER — Encounter (HOSPITAL_COMMUNITY): Payer: Self-pay

## 2023-10-02 ENCOUNTER — Other Ambulatory Visit: Payer: Self-pay | Admitting: *Deleted

## 2023-10-02 DIAGNOSIS — I6523 Occlusion and stenosis of bilateral carotid arteries: Secondary | ICD-10-CM

## 2023-10-03 ENCOUNTER — Other Ambulatory Visit: Payer: Self-pay | Admitting: *Deleted

## 2023-10-03 DIAGNOSIS — I739 Peripheral vascular disease, unspecified: Secondary | ICD-10-CM

## 2023-10-03 DIAGNOSIS — I6523 Occlusion and stenosis of bilateral carotid arteries: Secondary | ICD-10-CM

## 2023-10-16 ENCOUNTER — Ambulatory Visit (HOSPITAL_BASED_OUTPATIENT_CLINIC_OR_DEPARTMENT_OTHER)
Admission: RE | Admit: 2023-10-16 | Discharge: 2023-10-16 | Disposition: A | Discharge status: Home / Self Care | Source: Ambulatory Visit | Attending: Vascular Surgery | Admitting: Vascular Surgery

## 2023-10-16 ENCOUNTER — Ambulatory Visit (HOSPITAL_COMMUNITY)
Admission: RE | Admit: 2023-10-16 | Discharge: 2023-10-16 | Disposition: A | Discharge status: Home / Self Care | Source: Ambulatory Visit | Attending: Vascular Surgery | Admitting: Vascular Surgery

## 2023-10-16 DIAGNOSIS — I739 Peripheral vascular disease, unspecified: Secondary | ICD-10-CM

## 2023-10-16 DIAGNOSIS — Z95828 Presence of other vascular implants and grafts: Secondary | ICD-10-CM | POA: Diagnosis present

## 2023-10-16 LAB — VAS US ABI WITH/WO TBI
Left ABI: 1.05
Right ABI: 0.99

## 2023-10-17 ENCOUNTER — Ambulatory Visit: Payer: Self-pay | Admitting: Internal Medicine

## 2023-10-17 DIAGNOSIS — I739 Peripheral vascular disease, unspecified: Secondary | ICD-10-CM

## 2023-11-05 ENCOUNTER — Encounter: Payer: Self-pay | Admitting: Cardiovascular Disease

## 2023-11-05 ENCOUNTER — Ambulatory Visit: Attending: Cardiovascular Disease | Admitting: Cardiovascular Disease

## 2023-11-05 VITALS — BP 150/79 | HR 71 | Ht 66.0 in | Wt 154.6 lb

## 2023-11-05 DIAGNOSIS — I739 Peripheral vascular disease, unspecified: Secondary | ICD-10-CM | POA: Diagnosis not present

## 2023-11-05 DIAGNOSIS — E785 Hyperlipidemia, unspecified: Secondary | ICD-10-CM | POA: Diagnosis not present

## 2023-11-05 DIAGNOSIS — I6523 Occlusion and stenosis of bilateral carotid arteries: Secondary | ICD-10-CM

## 2023-11-05 DIAGNOSIS — I10 Essential (primary) hypertension: Secondary | ICD-10-CM | POA: Diagnosis not present

## 2023-11-05 DIAGNOSIS — I25118 Atherosclerotic heart disease of native coronary artery with other forms of angina pectoris: Secondary | ICD-10-CM

## 2023-11-05 NOTE — Progress Notes (Signed)
 Cardiology Office Note   Date:  11/05/2023   ID:  [name], DOB December 20, 1945, MRN [id number]  PCP:  Odie Sharper, MD  Cardiologist: Dr. Wonda  No chief complaint on file.      History of Present Illness: [name] is a 78 y.o. male who is here today for follow-up visit regarding peripheral arterial disease.   He has known history of coronary artery disease status post CABG in 1993 with multiple subsequent PCI's, chronic diastolic heart failure, chronic kidney disease, essential hypertension and hyperlipidemia.  He was treated for severe bilateral hip and thigh claudication in 2022. Angiography in February of 2022 showed significant distal aortic ulcerated stenosis extending into the ostium of both common iliac arteries with severe stenosis in the distal left common iliac artery.  I performed successful bilateral kissing common iliac artery stent placement extending into the distal aorta with an additional overlapped self-expanding stent on the left side.    Most recent arterial Doppler studies last month showed normal ABI bilaterally and patent iliac stents.  He has been doing well with noworsening chest pain or shortness of breath.  He has used sublingual nitroglycerin  once in the last few months.  No lower extremity claudication.   Past Medical History:  Diagnosis Date   Anemia    Blood transfusion    CAD (coronary artery disease)    a. CABG 1993;  b. PCI of SVG to OM Jan 2012 (Drug-eluting stent x 2);  c. 09/2012 Cath/PCI: LM nl, LAD sev dzs, LCX 100, RCA patent stent, VG->AM 100, LIMA->LAD nl, VG->OM 95p ISR (PTCA only), patent distal stent. // d. LHC 3/18: pLAD 100, oRI 100, pLCx 100, Lat OM 95, RCA stent ok, S-PDA 100, L-LAD ok, S-OM1 stent 80 ISR >> PCI: POBA to S-OM1; DES to native Lat OM1   CKD (chronic kidney disease), stage III (HCC)    Degeneration of lumbar or lumbosacral intervertebral disc    GERD (gastroesophageal reflux disease)    H/O hiatal hernia     History of echocardiogram    Echo 3/18: Mild LVH, EF 60-65, Gr 2 DD   History of nephrolithiasis    Hyperlipidemia    Hyperplastic colon polyp    Hypertension    Myocardial infarction (HCC)    hx of five    PAD (peripheral artery disease)    LE Arterial US  1/22: R CIA > 50% prox and mid and < 50% distal; R ATA mid occlusion   Stricture and stenosis of esophagus     Past Surgical History:  Procedure Laterality Date   ABDOMINAL AORTOGRAM W/LOWER EXTREMITY N/A 02/17/2020   Procedure: ABDOMINAL AORTOGRAM W/LOWER EXTREMITY;  Surgeon: Darron Deatrice LABOR, MD;  Location: [facility] INVASIVE CV LAB;  Service: Cardiovascular;  Laterality: N/A;   ANGIOPLASTY     BACK SURGERY     CARDIAC CATHETERIZATION     COLONOSCOPY     CORONARY ARTERY BYPASS GRAFT  1993   CORONARY BALLOON ANGIOPLASTY N/A 06/29/2020   Procedure: CORONARY BALLOON ANGIOPLASTY;  Surgeon: Anner Alm ORN, MD;  Location: [facility] INVASIVE CV LAB;  Service: Cardiovascular;  Laterality: N/A;   CORONARY STENT INTERVENTION N/A 03/19/2016   Procedure: Coronary Stent Intervention;  Surgeon: Sharper Wonda, MD;  Location: [facility] INVASIVE CV LAB;  Service: Cardiovascular;  Laterality: N/A;   HOLMIUM LASER APPLICATION Right 09/17/2013   Procedure: HOLMIUM LASER APPLICATION;  Surgeon: Garnette CHRISTELLA Shack, MD;  Location: [facility] ORS;  Service: Urology;  Laterality: Right;   LEFT HEART CATH  AND CORS/GRAFTS ANGIOGRAPHY N/A 03/19/2016   Procedure: Left Heart Cath and Cors/Grafts Angiography;  Surgeon: Ozell Fell, MD;  Location: [facility] INVASIVE CV LAB;  Service: Cardiovascular;  Laterality: N/A;   LEFT HEART CATH AND CORS/GRAFTS ANGIOGRAPHY N/A 06/29/2020   Procedure: LEFT HEART CATH AND CORS/GRAFTS ANGIOGRAPHY;  Surgeon: Anner Alm ORN, MD;  Location: [facility] INVASIVE CV LAB;  Service: Cardiovascular;  Laterality: N/A;   LEFT HEART CATHETERIZATION WITH CORONARY/GRAFT ANGIOGRAM N/A 09/23/2012   Procedure: LEFT HEART CATHETERIZATION WITH EL BILE;  Surgeon: Peter  M Jordan, MD;  Location: [facility] CATH LAB;  Service: Cardiovascular;  Laterality: N/A;   LITHOTRIPSY     Lumbar back     NEPHROLITHOTOMY Right 09/17/2013   Procedure: NEPHROLITHOTOMY PERCUTANEOUS;  Surgeon: Garnette CHRISTELLA Shack, MD;  Location: [facility] ORS;  Service: Urology;  Laterality: Right;   PERCUTANEOUS CORONARY INTERVENTION-BALLOON ONLY  09/23/2012   Procedure: PERCUTANEOUS CORONARY INTERVENTION-BALLOON ONLY;  Surgeon: Peter M Jordan, MD;  Location: [facility] CATH LAB;  Service: Cardiovascular;;  OM vein graft   PERIPHERAL VASCULAR INTERVENTION Bilateral 02/17/2020   Procedure: PERIPHERAL VASCULAR INTERVENTION;  Surgeon: Darron Deatrice LABOR, MD;  Location: [facility] INVASIVE CV LAB;  Service: Cardiovascular;  Laterality: Bilateral;  Iliac stents     Current Outpatient Medications  Medication Sig Dispense Refill   amLODipine  (NORVASC ) 10 MG tablet Take 1 tablet (10 mg total) by mouth every morning. 90 tablet 3   clopidogrel  (PLAVIX ) 75 MG tablet TAKE 1 TABLET BY MOUTH EVERYDAY AT BEDTIME 90 tablet 3   empagliflozin (JARDIANCE) 25 MG TABS tablet Take by mouth.     furosemide  (LASIX ) 20 MG tablet Take 20 mg by mouth daily as needed for fluid.     glimepiride  (AMARYL ) 2 MG tablet Take 2 mg by mouth in the morning and at bedtime.     glipiZIDE (GLUCOTROL XL) 10 MG 24 hr tablet Take 10 mg by mouth 2 (two) times daily.     isosorbide  mononitrate (IMDUR ) 60 MG 24 hr tablet Take 2 tablets (120 mg total) by mouth in the morning and at bedtime. 360 tablet 3   metoprolol  tartrate (LOPRESSOR ) 50 MG tablet Take 1.5 tablets (75 mg total) by mouth 2 (two) times daily. 270 tablet 3   nitroGLYCERIN  (NITROSTAT ) 0.4 MG SL tablet Place 1 tablet (0.4 mg total) under the tongue every 5 (five) minutes as needed for chest pain. Up to 3 doses 25 tablet 11   pantoprazole  (PROTONIX ) 40 MG tablet TAKE 1 TABLET BY MOUTH EVERY DAY 90 tablet 3   ranolazine  (RANEXA ) 500 MG 12 hr tablet Take 1 tablet (500 mg total) by mouth 2 (two) times daily. 60  tablet 11   rosuvastatin  (CRESTOR ) 20 MG tablet Take 1 tablet (20 mg total) by mouth daily. 90 tablet 3   Semaglutide,0.25 or 0.5MG /DOS, (OZEMPIC, 0.25 OR 0.5 MG/DOSE,) 2 MG/3ML SOPN Inject into the skin.     No current facility-administered medications for this visit.    Allergies:   Patient has no known allergies.    Social History:  The patient  reports that he quit smoking about 36 years ago. His smoking use included cigarettes. He has quit using smokeless tobacco. He reports that he does not drink alcohol  and does not use drugs.   Family History:  The patient's family history includes CAD (age of onset: 40) in some other family members; Coronary artery disease (age of onset: 63) in his mother; Kidney cancer in his mother; Lung cancer in his brother and sister.  ROS:  Please see the history of present illness.   Otherwise, review of systems are positive for none.   All other systems are reviewed and negative.    PHYSICAL EXAM: VS:  BP (!) 150/79 (BP Location: Left Arm, Patient Position: Sitting)   Pulse 71   Ht 5' 6 (1.676 m)   Wt 154 lb 9.6 oz (70.1 kg)   SpO2 99%   BMI 24.95 kg/m  , BMI Body mass index is 24.95 kg/m. GEN: Well nourished, well developed, in no acute distress  HEENT: normal  Neck: no JVD, or masses.  Right carotid bruit Cardiac: RRR; no murmurs, rubs, or gallops,no edema  Respiratory:  clear to auscultation bilaterally, normal work of breathing GI: soft, nontender, nondistended, + BS MS: no deformity or atrophy  Skin: warm and dry, no rash Neuro:  Strength and sensation are intact Psych: euthymic mood, full affect Vascular: Femoral pulse: +1 bilaterally.  Distal pulses are palpable.   EKG:  EKG is not ordered today.    Recent Labs: No results found for requested labs within last 365 days.    Lipid Panel    Component Value Date/Time   CHOL 135 04/19/2021 1131   TRIG 194 (H) 04/19/2021 1131   HDL 30 (L) 04/19/2021 1131   CHOLHDL 4.5  04/19/2021 1131   CHOLHDL 4.4 01/11/2015 0820   VLDL 24 01/11/2015 0820   LDLCALC 72 04/19/2021 1131   LDLDIRECT 122.5 12/02/2013 0922      Wt Readings from Last 3 Encounters:  11/05/23 154 lb 9.6 oz (70.1 kg)  07/29/23 154 lb 6.4 oz (70 kg)  06/12/23 160 lb 3.2 oz (72.7 kg)            No data to display            ASSESSMENT AND PLAN:  1.  Peripheral arterial disease: Status post bilateral common iliac artery stent placement extending into the distal aorta.  Most recent Doppler showed patent stents and low normal ABI.  He has palpable pulses by exam.  He has chronic right thigh discomfort while driving which is not due to peripheral arterial disease and likely due to lumbar spine disease. Repeat Doppler studies in 1 year.  2.  Coronary artery disease involving native coronary arteries with chronic angina.  Status post CABG and multiple PCI's.   His symptoms are stable overall.  3.  Essential hypertension: Blood pressure is mildly elevated but usually his blood pressure is controlled.  Continue to monitor for now.  4.  Hyperlipidemia: Currently on rosuvastatin  and Vascepa .  Most recent lipid profile showed an LDL of 72.  5.  Chronic kidney disease: This has been stable.  6.  Carotid artery disease with right carotid bruit: He has known moderate bilateral carotid stenosis worse on the left.  Repeat study in a yearly basis.    Disposition:   FU with me in 12 months  Signed,  Deatrice Cage, MD  11/05/2023 10:09 AM    [facility]

## 2023-11-05 NOTE — Patient Instructions (Signed)
 Medication Instructions:  No changes *If you need a refill on your cardiac medications before your next appointment, please call your pharmacy*  Lab Work: None ordered If you have labs (blood work) drawn today and your tests are completely normal, you will receive your results only by: MyChart Message (if you have MyChart) OR A paper copy in the mail If you have any lab test that is abnormal or we need to change your treatment, we will call you to review the results.  Testing/Procedures: None ordered  Follow-Up: At [facility], you and your health needs are our priority.  As part of our continuing mission to provide you with exceptional heart care, our providers are all part of one team.  This team includes your primary Cardiologist (physician) and Advanced Practice Providers or APPs (Physician Assistants and Nurse Practitioners) who all work together to provide you with the care you need, when you need it.  Your next appointment:   12 month(s)  Provider:   Dr. Arida
